# Patient Record
Sex: Female | Born: 1935
Health system: Southern US, Community
[De-identification: ages and names within clinical notes are randomized; demographics above are authoritative.]

## PROBLEM LIST (undated history)

## (undated) DIAGNOSIS — E785 Hyperlipidemia, unspecified: Secondary | ICD-10-CM

## (undated) DIAGNOSIS — E538 Deficiency of other specified B group vitamins: Secondary | ICD-10-CM

## (undated) DIAGNOSIS — E039 Hypothyroidism, unspecified: Secondary | ICD-10-CM

## (undated) DIAGNOSIS — E049 Nontoxic goiter, unspecified: Secondary | ICD-10-CM

## (undated) DIAGNOSIS — I10 Essential (primary) hypertension: Secondary | ICD-10-CM

## (undated) DIAGNOSIS — E7800 Pure hypercholesterolemia, unspecified: Secondary | ICD-10-CM

## (undated) DIAGNOSIS — M810 Age-related osteoporosis without current pathological fracture: Secondary | ICD-10-CM

## (undated) DIAGNOSIS — I2510 Atherosclerotic heart disease of native coronary artery without angina pectoris: Secondary | ICD-10-CM

## (undated) DIAGNOSIS — M1990 Unspecified osteoarthritis, unspecified site: Secondary | ICD-10-CM

## (undated) DIAGNOSIS — A0472 Enterocolitis due to Clostridium difficile, not specified as recurrent: Secondary | ICD-10-CM

## (undated) DIAGNOSIS — K225 Diverticulum of esophagus, acquired: Secondary | ICD-10-CM

## (undated) DIAGNOSIS — D649 Anemia, unspecified: Secondary | ICD-10-CM

## (undated) DIAGNOSIS — K219 Gastro-esophageal reflux disease without esophagitis: Secondary | ICD-10-CM

## (undated) HISTORY — PX: CORONARY ARTERY BYPASS GRAFT: SHX141

## (undated) HISTORY — DX: Nontoxic goiter, unspecified: E04.9

## (undated) HISTORY — DX: Essential (primary) hypertension: I10

## (undated) HISTORY — DX: Diverticulum of esophagus, acquired: K22.5

## (undated) HISTORY — DX: Atherosclerotic heart disease of native coronary artery without angina pectoris: I25.10

## (undated) HISTORY — DX: Age-related osteoporosis without current pathological fracture: M81.0

## (undated) HISTORY — DX: Deficiency of other specified B group vitamins: E53.8

## (undated) HISTORY — PX: ESOPHAGEAL DILATION: SHX303

## (undated) HISTORY — DX: Gastro-esophageal reflux disease without esophagitis: K21.9

## (undated) HISTORY — DX: Pure hypercholesterolemia, unspecified: E78.00

## (undated) HISTORY — PX: APPENDECTOMY: SHX54

## (undated) HISTORY — DX: Hyperlipidemia, unspecified: E78.5

## (undated) HISTORY — PX: BUNIONECTOMY: SHX129

## (undated) HISTORY — PX: ABDOMINAL HYSTERECTOMY: SHX81

## (undated) HISTORY — DX: Enterocolitis due to Clostridium difficile, not specified as recurrent: A04.72

---

## 1998-08-03 ENCOUNTER — Encounter: Payer: Self-pay | Admitting: *Deleted

## 1998-08-03 ENCOUNTER — Ambulatory Visit (HOSPITAL_COMMUNITY): Admission: RE | Admit: 1998-08-03 | Discharge: 1998-08-03 | Payer: Self-pay | Admitting: *Deleted

## 1998-10-13 ENCOUNTER — Encounter: Payer: Self-pay | Admitting: Surgery

## 1998-10-17 ENCOUNTER — Inpatient Hospital Stay (HOSPITAL_COMMUNITY): Admission: RE | Admit: 1998-10-17 | Discharge: 1998-10-22 | Payer: Self-pay | Admitting: Surgery

## 1998-10-17 ENCOUNTER — Encounter: Payer: Self-pay | Admitting: Surgery

## 1998-10-18 ENCOUNTER — Encounter: Payer: Self-pay | Admitting: Surgery

## 1998-10-19 ENCOUNTER — Encounter: Payer: Self-pay | Admitting: Surgery

## 2001-01-17 ENCOUNTER — Ambulatory Visit (HOSPITAL_COMMUNITY): Admission: RE | Admit: 2001-01-17 | Discharge: 2001-01-17 | Payer: Self-pay

## 2002-01-21 ENCOUNTER — Ambulatory Visit (HOSPITAL_COMMUNITY): Admission: RE | Admit: 2002-01-21 | Discharge: 2002-01-21 | Payer: Self-pay

## 2002-06-11 ENCOUNTER — Encounter: Payer: Self-pay | Admitting: Cardiology

## 2002-06-16 ENCOUNTER — Encounter: Payer: Self-pay | Admitting: Emergency Medicine

## 2002-06-16 ENCOUNTER — Emergency Department (HOSPITAL_COMMUNITY): Admission: EM | Admit: 2002-06-16 | Discharge: 2002-06-16 | Payer: Self-pay | Admitting: Emergency Medicine

## 2002-06-30 ENCOUNTER — Encounter (HOSPITAL_COMMUNITY): Admission: RE | Admit: 2002-06-30 | Discharge: 2002-07-30 | Payer: Self-pay | Admitting: Cardiology

## 2002-07-30 ENCOUNTER — Other Ambulatory Visit: Admission: RE | Admit: 2002-07-30 | Discharge: 2002-07-30 | Payer: Self-pay | Admitting: Unknown Physician Specialty

## 2003-04-15 ENCOUNTER — Ambulatory Visit (HOSPITAL_COMMUNITY): Admission: RE | Admit: 2003-04-15 | Discharge: 2003-04-15 | Payer: Self-pay | Admitting: Pulmonary Disease

## 2003-06-02 ENCOUNTER — Encounter: Payer: Self-pay | Admitting: Cardiology

## 2004-03-08 ENCOUNTER — Other Ambulatory Visit: Admission: RE | Admit: 2004-03-08 | Discharge: 2004-03-08 | Payer: Self-pay | Admitting: Dermatology

## 2004-07-26 ENCOUNTER — Ambulatory Visit: Payer: Self-pay | Admitting: *Deleted

## 2005-03-03 ENCOUNTER — Emergency Department (HOSPITAL_COMMUNITY): Admission: EM | Admit: 2005-03-03 | Discharge: 2005-03-03 | Payer: Self-pay | Admitting: Emergency Medicine

## 2005-09-28 ENCOUNTER — Ambulatory Visit: Payer: Self-pay | Admitting: Internal Medicine

## 2005-10-03 ENCOUNTER — Ambulatory Visit: Payer: Self-pay | Admitting: Internal Medicine

## 2005-10-03 ENCOUNTER — Ambulatory Visit (HOSPITAL_COMMUNITY): Admission: RE | Admit: 2005-10-03 | Discharge: 2005-10-03 | Payer: Self-pay | Admitting: Internal Medicine

## 2006-02-12 ENCOUNTER — Ambulatory Visit: Payer: Self-pay | Admitting: *Deleted

## 2006-06-05 ENCOUNTER — Ambulatory Visit: Payer: Self-pay | Admitting: Internal Medicine

## 2006-06-05 ENCOUNTER — Ambulatory Visit (HOSPITAL_COMMUNITY): Admission: RE | Admit: 2006-06-05 | Discharge: 2006-06-05 | Payer: Self-pay | Admitting: Internal Medicine

## 2006-08-06 ENCOUNTER — Ambulatory Visit: Payer: Self-pay | Admitting: Internal Medicine

## 2006-08-11 ENCOUNTER — Emergency Department (HOSPITAL_COMMUNITY): Admission: EM | Admit: 2006-08-11 | Discharge: 2006-08-11 | Payer: Self-pay | Admitting: Emergency Medicine

## 2007-08-07 ENCOUNTER — Ambulatory Visit: Payer: Self-pay | Admitting: Urgent Care

## 2007-08-22 ENCOUNTER — Ambulatory Visit: Payer: Self-pay | Admitting: Internal Medicine

## 2007-08-25 ENCOUNTER — Ambulatory Visit (HOSPITAL_COMMUNITY): Admission: RE | Admit: 2007-08-25 | Discharge: 2007-08-25 | Payer: Self-pay | Admitting: Internal Medicine

## 2007-09-02 ENCOUNTER — Ambulatory Visit: Payer: Self-pay | Admitting: Internal Medicine

## 2007-09-02 ENCOUNTER — Ambulatory Visit (HOSPITAL_COMMUNITY): Admission: RE | Admit: 2007-09-02 | Discharge: 2007-09-02 | Payer: Self-pay | Admitting: Internal Medicine

## 2007-09-02 HISTORY — PX: ESOPHAGOGASTRODUODENOSCOPY: SHX1529

## 2008-02-04 ENCOUNTER — Ambulatory Visit: Payer: Self-pay | Admitting: Internal Medicine

## 2008-11-18 ENCOUNTER — Encounter (INDEPENDENT_AMBULATORY_CARE_PROVIDER_SITE_OTHER): Payer: Self-pay | Admitting: *Deleted

## 2008-11-30 ENCOUNTER — Ambulatory Visit: Payer: Self-pay | Admitting: Internal Medicine

## 2008-11-30 DIAGNOSIS — K222 Esophageal obstruction: Secondary | ICD-10-CM | POA: Insufficient documentation

## 2008-12-08 ENCOUNTER — Ambulatory Visit (HOSPITAL_COMMUNITY): Admission: RE | Admit: 2008-12-08 | Discharge: 2008-12-08 | Payer: Self-pay | Admitting: Internal Medicine

## 2008-12-14 ENCOUNTER — Encounter: Payer: Self-pay | Admitting: Internal Medicine

## 2008-12-21 ENCOUNTER — Ambulatory Visit (HOSPITAL_COMMUNITY): Admission: RE | Admit: 2008-12-21 | Discharge: 2008-12-21 | Payer: Self-pay | Admitting: Internal Medicine

## 2008-12-21 ENCOUNTER — Ambulatory Visit: Payer: Self-pay | Admitting: Internal Medicine

## 2008-12-21 HISTORY — PX: ESOPHAGOGASTRODUODENOSCOPY: SHX1529

## 2009-11-15 ENCOUNTER — Ambulatory Visit (HOSPITAL_COMMUNITY): Admission: RE | Admit: 2009-11-15 | Discharge: 2009-11-15 | Payer: Self-pay | Admitting: Pulmonary Disease

## 2010-02-06 ENCOUNTER — Telehealth (INDEPENDENT_AMBULATORY_CARE_PROVIDER_SITE_OTHER): Payer: Self-pay

## 2010-02-07 ENCOUNTER — Encounter (INDEPENDENT_AMBULATORY_CARE_PROVIDER_SITE_OTHER): Payer: Self-pay | Admitting: *Deleted

## 2010-02-20 HISTORY — PX: ESOPHAGOGASTRODUODENOSCOPY: SHX1529

## 2010-02-27 ENCOUNTER — Ambulatory Visit: Payer: Self-pay | Admitting: Gastroenterology

## 2010-02-27 DIAGNOSIS — R1310 Dysphagia, unspecified: Secondary | ICD-10-CM | POA: Insufficient documentation

## 2010-02-27 DIAGNOSIS — K219 Gastro-esophageal reflux disease without esophagitis: Secondary | ICD-10-CM | POA: Insufficient documentation

## 2010-03-01 ENCOUNTER — Encounter: Payer: Self-pay | Admitting: Gastroenterology

## 2010-03-15 ENCOUNTER — Ambulatory Visit (HOSPITAL_COMMUNITY): Admission: RE | Admit: 2010-03-15 | Discharge: 2010-03-15 | Payer: Self-pay | Admitting: Internal Medicine

## 2010-03-15 ENCOUNTER — Ambulatory Visit: Payer: Self-pay | Admitting: Internal Medicine

## 2010-08-13 ENCOUNTER — Encounter: Payer: Self-pay | Admitting: Pulmonary Disease

## 2010-08-22 NOTE — Letter (Signed)
Summary: tcs order  tcs order   Imported By: Hendricks Limes LPN 95/62/1308 65:78:46  _____________________________________________________________________  External Attachment:    Type:   Image     Comment:   External Document

## 2010-08-22 NOTE — Letter (Signed)
Summary: Scheduled Appointment  [facility]  [address]   [place], [place] [address]   Phone: [phone]  Fax: [phone]    February 07, 2010   Dear: Kearney Hard Furukawa            DOB: Nov 23, 1918    I have been instructed to schedule you an appointment in our office.  Your appointment is as follows:   Date:           02/27/10   Time:          11AM    Please be here 15 minutes early.   Provider:      Tana Coast, P.A.-C.    Please contact the office if you need to reschedule this appointment for a more convenient time.   Thank you,    Diana Eves       [facility] Ph: [phone]   Fax: [phone]

## 2010-08-22 NOTE — Progress Notes (Signed)
Summary: difficulty swallowing  Phone Note Call from Patient Call back at Home Phone [phone] Call back at [phone]   Caller: Other Relative Summary of Call: Neice- Pheobe Ellington called- pt is having difficulty swallowing again. She can keep down liquids and soft solids. Advised if they start experiencing episodes where she is getting choked on her own salivia she needs to go to ED.  They want to know if pt needs ov or if they can be triaged to have egd with ed. please advise.  Initial call taken by: Hendricks Limes LPN,  February 06, 2010 11:53 AM     Appended Document: difficulty swallowing would be best to have ov first  Appended Document: difficulty swallowing mailed letter to pt with appt for 8/8 @ 11am w/LSL

## 2010-08-22 NOTE — Assessment & Plan Note (Signed)
Summary: DYSPHAGIA WITH SOLID AND LIQUIDS/SS   Visit Type:  f/u Primary Care Provider:  Shaune Pollack  Chief Complaint:  difficulty swallowing.  History of Present Illness: Ms. Lennartz is a pleasant 77 y/o female, with h/o recurrent esophageal dysphagia requiring numerous esophageal dilatations in the past. Last one in 6/10. She presents with c/o difficulty swallowing. Her niece, Mosetta Putt, called couple of weeks ago concerned about pt's swallowing problems.   Having trouble getting solid foods down. Eating small amounts, soft foods, drinking plenty of liquids. No odynophagia. No abd pain. BMs less frequent since cut back on eating. No melena, brbpr. Weight down 3 pounds.  Patient does not know her medications and did not bring list. She gets most mail-order. States she takes ASA and Fosamax daily.   Current Medications (verified): 1)  Bayer Aspirin 325 Mg Tabs (Aspirin) .... Take 1 Tablet By Mouth Once A Day 2)  Accupril 20 Mg Tabs (Quinapril Hcl) .... Take 1 Tablet By Mouth Once A Day 3)  Toprol Xl 50 Mg .... Take 1 Tablet By Mouth Once A Day 4)  Lipitor 10 Mg Tabs (Atorvastatin Calcium) .... Take 1 Tablet By Mouth Once A Day 5)  Synthroid 175 Mcg Tabs (Levothyroxine Sodium) .... Take 1 Tablet By Mouth Once A Day 6)  Norvasc 5 Mg Tabs (Amlodipine Besylate) .... Take 1 Tablet By Mouth Once A Day 7)  Prevacid Solutab 30 Mg Tbdp (Lansoprazole) .... Take 1 Tablet By Mouth Once A Day 8)  B 12 Injection .... Once Monthly 9)  Fosamax 70 Mg Tabs (Alendronate Sodium) .... Qweekly  Allergies (verified): 1)  ! Lucy Antigua  Past History:  Past Medical History: Thyroid goiter s/p surgery now on Synthroid B12 def Coronary Artery Disease Small Zenker diverticulum Multiple esophgeal dilations, Schatzkri ring/Cervical web, 2009  EGD/ED, 6/10, Dr. Rourk-->1. Schatzki's ring, otherwise endoscopically normal esophagus, status post dilation (69F), with mucosal disruption through the cervical segment as  well as the Schatzki's ring. Hiatal hernia. Hyperlipidemia Hypertension Osteoporosis GERD  Past Surgical History: Status Post four-vessel coronary artery bypass graft  Thyroid goiter/nodule Bunionectomy Hysterectomy Appendectomy    Family History: No FH of Colon Cancer, liver disease, chronic GI illnesses.  Social History: Reviewed history from 11/30/2008 and no changes required. Marital Status: Widowed Children: No Occupation: [employer] 35 yrs Patient has never smoked.  Alcohol Use - no  Review of Systems General:  Complains of weight loss; denies fever, chills, sweats, anorexia, fatigue, and weakness. Eyes:  Denies vision loss. ENT:  Complains of difficulty swallowing; denies nasal congestion, sore throat, and hoarseness. CV:  Denies chest pains, angina, palpitations, dyspnea on exertion, and peripheral edema. Resp:  Denies dyspnea at rest, dyspnea with exercise, cough, sputum, and wheezing. GI:  See HPI. GU:  Denies urinary burning and blood in urine. MS:  Denies joint pain / LOM. Derm:  Denies rash and itching. Neuro:  Denies weakness, frequent headaches, memory loss, and confusion. Psych:  Denies depression and anxiety. Endo:  Denies unusual weight change. Heme:  Denies bruising and bleeding. Allergy:  Denies hives and rash.  Vital Signs:  Patient profile:   77 year old female Height:      63 inches Weight:      134 pounds BMI:     23.82 Temp:     97.7 degrees F oral Pulse rate:   60 / minute BP sitting:   110 / 72  (left arm) Cuff size:   regular  Vitals Entered By: Hendricks Limes LPN (  February 27, 2010 11:02 AM)  Physical Exam  General:  Well developed, well nourished, no acute distress. Head:  Normocephalic and atraumatic. Eyes:  sclera nonicteric Mouth:  Oropharyngeal mucosa moist, pink.  No lesions, erythema or exudate.    Neck:  Supple; no masses or thyromegaly. Lungs:  Clear throughout to auscultation. Heart:  Regular rate and rhythm; no murmurs, rubs,   or bruits. Abdomen:  Bowel sounds normal.  Abdomen is soft, nontender, nondistended.  No rebound or guarding.  No hepatosplenomegaly, masses or hernias.  No abdominal bruits.  Extremities:  No clubbing, cyanosis, edema or deformities noted. Neurologic:  Alert and  oriented x4;  grossly normal neurologically. Skin:  Intact without significant lesions or rashes. Cervical Nodes:  No significant cervical adenopathy. Psych:  poor memory.    Impression & Recommendations:  Problem # 1:  DYSPHAGIA UNSPECIFIED (ICD-787.20)  Recurrent dysphagia with h/o cervical esophageal web/schatzki's ring in past. Last EGD over one year ago. Symptoms recurrent for past couple of months. Patient back on Fosamax, per pharmacy she takes once weekly (last filled 01/30/10), but patients says she takes daily. Will have niece provide [place] with accurant drug list. Patient will need to be scheduled for EGD/ED after list obtained. Need to make sure she is still on PPI as well.   Orders: Est. Patient Level IV (04540)  Problem # 2:  GERD (ICD-530.81)  see #1.  Orders: Est. Patient Level IV (98119)  Appended Document: DYSPHAGIA WITH SOLID AND LIQUIDS/SS Please schedule for EGD/ED with RMR.  Patient was instructed to hold Fosamax until after procedure (due to dysphagia). May continue ASA.  Appended Document: DYSPHAGIA WITH SOLID AND LIQUIDS/SS pt scheduled for 03/15/10 @ 1045 with RMR. Instructions mailed to pt

## 2010-12-05 NOTE — Assessment & Plan Note (Signed)
NAME:  ADALEY, KIENE                 CHART#:  [id number]   DATE:  02/04/2008                       DOB:  1918/08/05   CHIEF COMPLAINT:  Followup dysphagia.   PROBLEM LIST:  1. Cervical esophageal web and Schatzki ring, status post last      dilatation by Dr. Jena Gauss on 09/02/2007, EGD.  2. Small Zenker diverticulum.  3. Coronary disease.  4. Hypercholesterolemia.  5. Thyroid goiter status post surgery, now on Synthroid.  6. Hysterectomy.  7. Appendectomy.  8. Osteoporosis.  9. Chronic sinusitis.   SUBJECTIVE:  The patient is an 77 year old Caucasian female.  She has  history of esophageal dysphagia, last dilatation as described above.  She is doing very well.  She is taking Prevacid 30 mg daily.  She has no  complaints today.  Her weight is stable.  Her appetite is good.  She  denies any problems with liquids.  Occasionally, she still gets choked  on large bites.  She tries to eat smaller bites of food.  Denies any  heartburn or indigestion.  Denies any odynophagia and her bowel  movements have been normal, soft, and brown on a daily basis.  Denies  any rectal bleeding or melena.   CURRENT MEDICATIONS:  Detailed list from 02/04/2008.   ALLERGIES:  No known drug allergies.   OBJECTIVE:  VITAL SIGNS:  Weight 140 pounds, height 64 inches,  temperature 97.2, blood pressure 130/70, and pulse 64.  GENERAL:  The patient is a pleasant, elderly, cooperative Caucasian  female in no acute distress.  HEENT:  Sclerae nonicteric.  Conjunctivae pink.  Oropharynx pink and  moist without any lesions.  CHEST:  Heart has regular rate and rhythm.  Normal S1, S2.  ABDOMEN:  Positive bowel sounds x4.  No bruits auscultated.  Soft,  nontender, and nondistended without palpable mass or hepatosplenomegaly.  No rebound, tenderness, or guarding.  EXTREMITIES:  Without clubbing or edema.   ASSESSMENT:  Esophageal dysphagia with history of small Zenker  diverticulum, cervical web, and Schatzki ring,  doing well status post  esophageal dilatation on 09/02/2007.   PLAN:  Continue Prevacid Solu Tab 30 mg daily.  Office visit in 1 year  with Dr. Jena Gauss or sooner, if she has any problems.       Lorenza Burton, N.P.  Electronically Signed     R. Roetta Sessions, M.D.  Electronically Signed    KJ/MEDQ  D:  02/04/2008  T:  02/05/2008  Job:  [id number]   cc:   Ramon Dredge L. Juanetta Gosling, M.D.

## 2010-12-05 NOTE — Assessment & Plan Note (Signed)
NAME:  ACACIA, LATORRE                 CHART#:  [id number]   DATE:  08/22/2007                       DOB:  18-Jan-1919   CHIEF COMPLAINT:  Dysphagia, I want EGD and my esophagus stretched.   SUBJECTIVE:  Ms. Cogliano is an 77 year old female.  She has history of  complicated gastroesophageal reflux disease and peptic stricture.  She  was last dilated June 05, 2006.  Interestingly, I had just seen Ms.  Trimarco about two weeks age. Please see my August 07, 2007 note.  She had  told me she was doing quite well at that time.  She denied any recurrent  dysphagia.  Overall she was doing quite well.  She also noted that she  was continuing to take her Fosamax, which Dr. Jena Gauss had discontinued  previously.  It appears as though she felt like she needed it and  continued to take it.  Her niece, who is with her today, thinks that  this may have been discontinued, but ultimately neither one is sure as  to whether she is taking the Fosamax at this time or not.  Her niece  tells me she has had several choking spells, most recently was 4 days  ago at [employer].  She felt as though her food got stuck in her mid  esophagus.  It would not go down.  She tells me she has had two or three  episodes since around the holidays within the last two months.  She  tells me she chews her food very thoroughly and eats small amounts.  She  does well.  She never has any problems with liquids.  Her weight is  stable.   CURRENT MEDICATIONS:  See the list from August 22, 2007.   ALLERGIES:  No known drug allergies.   OBJECTIVE:  VITAL SIGNS:  Weight:  140 pounds.  Height:  54 inches.  Temperature:  97.5.  Blood pressure:  118/72.  Pulse:  72.  GENERAL:  Ms. Bahena is an 77 year old elderly female who is alert,  oriented, pleasant and easily confused.  She is oriented to self and to  place, but disoriented to time.  HEENT:  Sclerae clear, nonicteric.  Conjunctivae pink.  Oropharynx pink  and moist without lesions.  SKIN:   Warm and dry without any rash or jaundice.   ASSESSMENT:  Ms. Lilja is a pleasant 77 year old female with history of  complicated GERD status post dilatation of a peptic stricture in  November of 2007.  Last seen two weeks ago.  There were no complaints of  dysphagia today.  She is brought in for a work-in with her niece, who  notes she has had several choking episodes over the last two or three  months, mostly with solid foods.  Denies any problems with liquids.  I  suspect she may have recurrence of peptic stricture versus an  oropharyngeal dysphagia.  Interestingly, we do not know whether Ms. Haan  is taking her Fosamax or not, but this could be contributing to her  dysphagia as well.   PLAN:  1. Barium pill esophagram in the near future to further differentiate      whether or not she is going to need an esophageal dilatation.  2. She is to continue Prevacid 30 mg daily.  3. I will be calling her  with the results and go from there.       Lorenza Burton, N.P.  Electronically Signed     R. Roetta Sessions, M.D.  Electronically Signed    KJ/MEDQ  D:  08/22/2007  T:  08/22/2007  Job:  [id number]   cc:   Ramon Dredge L. Juanetta Gosling, M.D.

## 2010-12-05 NOTE — Assessment & Plan Note (Signed)
NAME:  Elizabeth [name], Elizabeth [name]                 CHART#:  [id number]   DATE:  08/07/2007                       DOB:  [birth year]   CHIEF COMPLAINT:  Annual followup, complicated GERD.   SUBJECTIVE:  Ms. Cleaver is an 77 year old female.  She has a history of  complicated gastroesophageal reflux disease.  On June 05, 2006, she  was dilated with a 56-French Maloney dilator during EGD due to a peptic  stricture of the EG junction.  She has done quite well since that time.  Her weight has remained stable.  She rarely has choking episodes, tells  me only when she eats to much.  She is taking Fosamax. She tells me  she takes it before supper.  She is also on Prevacid 30 mg solutabs  daily.  She denies any anorexia or early satiety.  She denies any rectal  bleeding or melena.   CURRENT MEDICATIONS:  See the list from August 07, 2007.   ALLERGIES:  No known drug allergies.   OBJECTIVE:  VITAL SIGNS:  Weight 142 pounds.  Height 64 inches.  Temp 98  degrees.  Blood pressure 112/82.  Pulse 72.  GENERAL:  She is an elderly Caucasian female who is alert, oriented,  pleasant, and cooperative, in no acute distress.  HEENT:  Sclerae clear, anicteric.  Conjunctivae pink.  Oropharynx pink  and moist without any lesions.  CHEST:  Heart regular rate and rhythm.  Normal S1, S2.  ABDOMEN:  Positive bowel sounds x4.  No bruits auscultated.  Soft,  nontender, nondistended without hepatosplenomegaly.  EXTREMITIES:  There is 1+ pretibial edema bilaterally and ankle edema.   ASSESSMENT:  Mr. Meyerhoff is an 77 year old female with complicated  gastroesophageal reflux disease with history of peptic stricture.  Last  dilatation June 05, 2006, doing well.   PLAN:  1. Continue Prevacid 30 mg daily, #30 with 11 refills.  2. Office visit in one year with Dr. Jena Gauss or sooner if needed.       Lorenza Burton, N.P.  Electronically Signed     R. Roetta Sessions, M.D.  Electronically Signed    KJ/MEDQ  D:  08/08/2007   T:  08/08/2007  Job:  [id number]   cc:   Ramon Dredge L. Juanetta Gosling, M.D.

## 2010-12-05 NOTE — Op Note (Signed)
NAME:  Elizabeth [name], REBER                ACCOUNT NO.:  [id number]   MEDICAL RECORD NO.:  [id number]          PATIENT TYPE:  AMB   LOCATION:  DAY                           FACILITY:  [facility]   PHYSICIAN:  R. Roetta Sessions, M.D. DATE OF BIRTH:  04/20/1919   DATE OF PROCEDURE:  12/21/2008  DATE OF DISCHARGE:                               OPERATIVE REPORT   EGD with Elease Hashimoto dilation.   INDICATIONS FOR PROCEDURE:  This 77 year old lady with recurrent  esophageal dysphagia has a history of cervical esophageal web Schatzki's  ring dilated in early 2009.  Recent barium pill esophagram demonstrated  narrowing consistent with a stricture/cervical web, at which point the  pill hung and dissolved.  She is here for EGD with dilation as  appropriate.  Risks, benefits, alternatives and limitations have been  reviewed previously and again at the bedside.  All parties questions  answered, all parties agreeable.   PROCEDURE NOTE:  The O2 saturation, blood pressure, pulse respirations  monitored throughout the entirety of the procedure.   CONSCIOUS SEDATION:  1. Versed 3 mg IV.  2. Demerol 75 mg IV in divided doses.   INSTRUMENT:  Pentax video chip system.   Cetacaine spray for topical oropharyngeal anesthesia.   FINDINGS:  Examination of the tubular esophagus through the cervical  segment on down revealed no abnormalities.  She reportedly has a small  Zenker's, I did not appreciate it nor did I appreciate a cervical web or  narrowing.  There was a Schatzki's ring however, but the intervening  esophageal mucosa appeared normal.  EG junction easily traversed with  the scope.  The remaining stomach, colon, gastric cavity was emptied and  insufflated well with air.  A thorough examination of the gastric  mucosa, including retroflexion of the proximal stomach and  esophagogastric junction, demonstrated only a hiatal hernia.  Pylorus  was patent and easily traversed.  Examination of the bulb and second  portion revealed no abnormalities.   THERAPEUTIC/DIAGNOSTIC MANEUVERS PERFORMED:  The scope was withdrawn and  a 56-French Maloney dilator was passed to full insertion with slight  resistance upon full insertion.  A look back revealed a superficial tear  through the UES mucosa and disruption of the Schatzki's ring.  There was  minimal bleeding and no apparent complication.  The patient tolerated  the procedure well, was reactive to endoscopy.   IMPRESSION:  1. Schatzki's ring, otherwise endoscopically normal esophagus, status      post dilation described above, with mucosal disruption through the      cervical segment as well as the Schatzki's ring.  2. Hiatal hernia, otherwise normal stomach, D1, D2.   RECOMMENDATIONS:  1. Continue Prevacid 30 mg orally daily indefinitely.  2. If dysphagia symptoms should return she may well end up needing yet      another dilation, but hopefully this will hold her for some time to      come.      Jonathon Bellows, M.D.  Electronically Signed     RMR/MEDQ  D:  12/21/2008  T:  12/21/2008  Job:  [id number]  cc:   Ramon Dredge L. Juanetta Gosling, M.D.  Fax: [phone]

## 2010-12-05 NOTE — Op Note (Signed)
NAME:  Elizabeth [name], Elizabeth [name]                ACCOUNT NO.:  [id number]   MEDICAL RECORD NO.:  [id number]          PATIENT TYPE:  AMB   LOCATION:  DAY                           FACILITY:  [facility]   PHYSICIAN:  R. Roetta Sessions, M.D. DATE OF BIRTH:  [birth year]   DATE OF PROCEDURE:  09/02/2007  DATE OF DISCHARGE:  09/02/2007                               OPERATIVE REPORT   PROCEDURE PERFORMED:  Esophagogastroduodenoscopy with Elease Hashimoto dilation.   INDICATIONS FOR PROCEDURE:  77 year old lady with recurrent esophageal  dysphagia and Schatzki's ring.  She was last dilated back in 2007.  She  has been taking Prevacid 30 mg orally daily. EGD is now being done.  The  potential for esophageal dilation are reviewed. The risks, benefits and  alternatives have been discussed.  Please see documentation in the  medical record.   PROCEDURE NOTE:  O2 saturation, blood pressure, pulse, and respirations  were monitored throughout the entire procedure.  Conscious sedation with  Versed 4 mg IV and Demerol 75 mg IV in divided doses.  Instrument Pentax  video chip system.  Cetacaine spray for topical pharyngeal anesthesia.   FINDINGS:  Examination of the tubular esophagus revealed a cervical  esophageal web and a Schatzki's ring.  The intervening esophageal mucosa  appeared normal.  The EG junction was easily traversed.  The gastric  cavity was emptied and insufflated well with air.  A thorough  examination of the gastric mucosa including retroflex view of the  proximal stomach and esophagogastric junction demonstrated only a hiatal  hernia.  The pylorus was patent, easily traversed.  Examination of the  bulb and second portion revealed no abnormalities.   THERAPEUTIC/DIAGNOSTIC MANEUVERS PERFORMED:  The scope was withdrawn.  A  56 French Maloney dilator was passed to full insertion with ease.  Look  back revealed both the ring and the web had been ruptured without  apparent complication.  The patient tolerated the  procedure well and was  reacted in endoscopy.   IMPRESSION:  Cervical esophageal web, Schatzki's ring, status post  dilation and disruption as described above. Otherwise, normal esophagus.  Hiatal hernia, otherwise, normal stomach, D1 and D2.   RECOMMENDATIONS:  Advance diet as tolerated later today.  Continue  Prevacid 30 mg orally daily indefinitely. The patient is to let me know  if she has any future difficulties with swallowing.      Jonathon Bellows, M.D.  Electronically Signed     RMR/MEDQ  D:  09/02/2007  T:  09/04/2007  Job:  [id number]   cc:   Ramon Dredge L. Juanetta Gosling, M.D.  Fax: [phone]

## 2010-12-08 NOTE — Assessment & Plan Note (Signed)
[facility]                         [facility] OFFICE NOTE   NAME:Elizabeth [name], Elizabeth [name]                       MRN:          [id number]  DATE:02/12/2006                            DOB:          1918-11-18    PRIMARY CARE PHYSICIAN:  Ramon Dredge L. Juanetta Gosling, MD   Ms. Cove returns today.  This is a woman who was previously followed by my  colleague, Dr. Valera Castle, who has coronary disease, normal LV systolic  function, hypertension, hyperlipidemia, hypothyroidism.  She is doing very  well and has no complaints.  She is still very active.  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  Also does a little bit on the  stationary  bike.  She is otherwise feeling great.   CURRENT MEDICATIONS:  1.  Synthroid 0.75 mcg once daily.  2.  Aspirin 325 once daily.  3.  Accupril 20 mg once daily.  4.  Norvasc 10 mg once daily.  5.  Toprol XL 50 mg once daily.  6.  Lipitor 10 mg once daily.  7.  Antivert 25 mg once daily.  8.  Fosamax.  She takes once a week.   PHYSICAL EXAMINATION:  VITAL SIGNS:  Weight 138 pounds.  Blood pressure  98/60, pulse 82.  CHEST:  Clear.  VASCULAR:  She has no jugular venous distention or lower extremity edema.  CARDIOVASCULAR:  Regular with no murmur.   IMPRESSION:  1.  Coronary disease appears to be stable.  2.  Hypertension.  She is a little bit over-medicated.  I think probably      dropping her Norvasc down to 5 mg once daily probably makes more sense.      Her lipids need to be checked, and we will get those checked for her.      She is on a reasonable medication regimen otherwise.  Hypertension is a      little bit too aggressively controlled, so we will have to decrease her      Norvasc.  We will see her back in 12 months for a routine followup.  She      is going to follow up with Dr. Juanetta Gosling for a blood pressure check.                                  Farris Has. Dorethea Clan, MD   JMH/MedQ  DD:  02/12/2006  DT:   02/12/2006  Job #:  [id number]   cc:   Ramon Dredge L. Juanetta Gosling, MD

## 2010-12-08 NOTE — Consult Note (Signed)
NAME:  Cheese, Dinah                    ACCOUNT NO.:  [id number]   MEDICAL RECORD NO.:  [id number]           PATIENT TYPE:   LOCATION:                                 FACILITY:   PHYSICIAN:  R. Roetta Sessions, M.D. DATE OF BIRTH:  August 07, 1918   DATE OF CONSULTATION:  09/28/2005  DATE OF DISCHARGE:                                   CONSULTATION   REFERRING PHYSICIAN:  Oneal Deputy. Juanetta Gosling, M.D.   REASON FOR CONSULTATION:  Problem swallowing.   HISTORY OF PRESENT ILLNESS:  Ms. Vukelich is an 77 year old, Caucasian female  with 25-month history of esophageal dysphagia.  She is having problems  swallowing large meals and solid foods.  She has to wash her food down with  lots of liquid.  Denies any odynophagia or heartburn, nausea, vomiting,  abdominal pain, constipation, diarrhea, melena or rectal bleeding.  She has  never had an upper endoscopy or colonoscopy.   CURRENT MEDICATIONS:  1.  Fosamax 70 mg each week.  2.  Aspirin 325 mg daily.  3.  Accupril 20 mg daily.  4.  Toprol XL 50 mg daily.  5.  Lipitor 10 mg daily.  6.  Synthroid 75 mcg daily.  7.  Norvasc 10 mg daily.   ALLERGIES:  No known drug allergies.   PAST MEDICAL HISTORY:  1.  Sinusitis.  2.  Osteoporosis.  3.  Coronary artery disease, status post four-vessel coronary artery bypass      graft over 5 years ago.  4.  Hypercholesterolemia.  5.  History of thyroid goiter or nodule, status post surgery, now on      Synthroid.  6.  Bunionectomy.  7.  Hysterectomy.  8.  Appendectomy.   FAMILY HISTORY:  Negative for colorectal cancer or chronic GI illnesses.   SOCIAL HISTORY:  She is widowed.  She is retired from [employer].  Nonsmoker.  No alcohol use.   REVIEW OF SYSTEMS:  GASTROINTESTINAL:  See HPI for GI.  Denies any weight  loss.  CARDIOPULMONARY:  No chest pain or shortness of breath.   PHYSICAL EXAMINATION:  VITAL SIGNS:  Weight 141, height 5 feet 4 inches,  temperature 97.6, blood pressure 142/63, pulse  72.  GENERAL:  Well-developed, well-nourished, elderly, Caucasian female in no  acute distress.  SKIN:  Warm and dry, no jaundice.  HEENT:  Conjunctivae are pink.  Sclerae nonicteric.  Oropharyngeal mucosa  moist and pink.  No lesions, erythema or exudate.  No lymphadenopathy or  thyromegaly.  LUNGS:  Clear to auscultation.  CARDIAC:  Regular rate and rhythm with normal S1, S2.  No murmurs, rubs or  gallops.  ABDOMEN:  Positive bowel sounds.  Soft, nontender, nondistended.  No  organomegaly or masses.  No rebound tenderness or guarding.  No abdominal  bruits or hernias.  EXTREMITIES:  No edema.   IMPRESSION:  Ms. Auerbach is an 77 year old, Caucasian with a 3-month history of  solid food esophageal dysphagia.  I suspect she has an esophageal web-ring  or stricture.  She has never had a colonoscopy, would  consider one at some  point in the near future if she is interested.   RECOMMENDATIONS:  1.  EGD with esophageal dilatation with Dr. Jena Gauss in the near future.  2.  Will hold aspirin 4 days prior to procedure.  3.  If the patient is interested, could consider colonoscopy for screening      purposes at some point in the near future.   I would like to thank Dr. Juanetta Gosling for allowing [place] to take part in the care  of this patient.      Tana Coast, P.AJonathon Bellows, M.D.  Electronically Signed    LL/MEDQ  D:  09/28/2005  T:  09/28/2005  Job:  [id number]

## 2010-12-08 NOTE — Op Note (Signed)
NAME:  Elizabeth [name], Elizabeth [name]                ACCOUNT NO.:  [id number]   MEDICAL RECORD NO.:  [id number]          PATIENT TYPE:  AMB   LOCATION:  DAY                           FACILITY:  [facility]   PHYSICIAN:  R. Roetta Sessions, M.D. DATE OF BIRTH:  [birth year]   DATE OF PROCEDURE:  10/03/2005  DATE OF DISCHARGE:                                 OPERATIVE REPORT   PROCEDURE:  Esophagogastroduodenoscopy with Elease Hashimoto dilation.   INDICATIONS FOR PROCEDURE:  An 77 year old lady with esophageal dysphagia.  EGD is now being done. This approach has been discussed with the patient at  length. Potential risks, benefits, and alternatives have been reviewed and  questions answered. Please see H&P from September 28, 2005 for more information.   PROCEDURE NOTE:  O2 saturation, blood pressure, pulse, and respirations were  monitored throughout the entire procedure. Conscious sedation with Versed 2  mg IV and Demerol 50 mg IV in divided doses.   INSTRUMENT:  Olympus video chip system.   FINDINGS:  Examination of the tubular esophagus revealed a prominent  Schatzki's ring. Esophageal mucosa otherwise appeared normal. There was  somewhat of a dilation of the ring when the scope was passed through it.   Stomach:  Gastric cavity was empty and insufflated well with air. Thorough  examination of gastric mucosa including retroflexed view of the proximal  stomach and esophagogastric junction demonstrated moderately large-sized  hiatal hernia. A couple of antral erosions. Otherwise normal abnormalities.  Pylorus patent and easily traversed. Examination of bulb and second portion  revealed no abnormalities.   THERAPEUTIC/DIAGNOSTIC MANEUVERS:  A 54-French dilator was passed to full  insertion with ease. A look back revealed the ring had been successfully  ruptured without any apparent complication. The patient tolerated the  procedure well and was reactive to endoscopy.   IMPRESSION:  1.  Probable Schatzki's ring. Otherwise  normal esophagus. Status post      dilation and disruption as described above.  2.  Moderately large hiatal hernia. A couple of antral erosions. Otherwise      normal stomach. Normal D1 and D2.   RECOMMENDATIONS:  1.  Begin Aciphex 20 mg orally daily indefinitely.  2.  Swallowing precautions with Fosamax medication emphasized.  3.  The patient is to call me if she has any future problems.  4.  She has been offered a screening colonoscopy. This has been recommended      to Ms. Love. She will let us know.      Jonathon Bellows, M.D.  Electronically Signed     RMR/MEDQ  D:  10/03/2005  T:  10/04/2005  Job:  [id number]   cc:   Ramon Dredge L. Juanetta Gosling, M.D.  Fax: [phone]

## 2010-12-08 NOTE — Op Note (Signed)
NAME:  Elizabeth [name], HAUBNER                ACCOUNT NO.:  [id number]   MEDICAL RECORD NO.:  [id number]          PATIENT TYPE:  AMB   LOCATION:  DAY                           FACILITY:  [facility]   PHYSICIAN:  R. Roetta Sessions, M.D. DATE OF BIRTH:  07-Oct-1918   DATE OF PROCEDURE:  06/05/2006  DATE OF DISCHARGE:                                 OPERATIVE REPORT   PROCEDURE:  EGD with Elease Hashimoto dilation.   INDICATIONS FOR PROCEDURE:  The patient is a 77 year old lady with recurrent  esophageal dysphagia.  I saw her back in March of this year.  She was felt  to have incomplete Schatzki's ring and a hiatal hernia.  She was dilated up  a 54-French Maloney dilator.  Dysphagia improved for several weeks but then  it has easily recurred.  She is not on any acid suppression therapy.  I do  know she is taking Fosamax 70 mg weekly and aspirin 325 mg daily.  EGD is  now being done.  This approach has been discussed with the patient at  length.  Potential risks, benefits and alternatives have been reviewed,  questions answered and she is agreeable.  Please see documentation in the  medical record.   PROCEDURE NOTE:  O2 saturation, blood pressure, pulse and respirations  monitored throughout the entire procedure.   CONSCIOUS SEDATION:  Versed 3 mg IV Demerol 75 mg IV.   INSTRUMENT:  Olympus video chip system.   FINDINGS:  Examination of tubular esophagus revealed what appeared to be a  soft friable noncritical peptic stricture at the EG junction, did not see a  Zenker's.  There was no malignant appearance to any of the mucosa of the  upper GI tract.  EG junction was easily traversed with the scope.   Stomach:  Gastric cavity was emptied and insufflated well with air.  Thorough examination of the gastric mucosa including retroflexed view of the  proximal stomach and esophagogastric junction demonstrated a only a moderate  size hiatal hernia.  Pylorus is patent and easily traversed.  Examination of  the bulb  and second portion revealed no abnormalities.   THERAPEUTIC/DIAGNOSTIC MANEUVERS PERFORMED:  A 56-French Maloney dilator was  passed to full insertion.  A look back revealed the EG junction had been  dilated.  There was minimal bleeding, no apparent complication.  Related to  pass of the dilator, the patient tolerated the procedure well and was  reactive to endoscopy.   IMPRESSION:  Friable soft appearing noncritical peptic stricture at the  esophagogastric junction, otherwise normal esophagus status post dilation as  described above.  Moderate size hiatal hernia otherwise normal stomach, D1,  D2.   I suspect we may be seeing some effects of Fosamax in a background setting  of reflux disease.   RECOMMENDATIONS:  1. Stop Fosamax.  2. Begin Prevacid 30 mg SoluTab once daily, prescription given.  3. Follow-up appointment with me in 2 months.      Jonathon Bellows, M.D.  Electronically Signed     RMR/MEDQ  D:  06/05/2006  T:  06/05/2006  Job:  [id number]  cc:   Ramon Dredge L. Juanetta Gosling, M.D.  Fax: [phone]

## 2011-01-11 ENCOUNTER — Emergency Department (HOSPITAL_COMMUNITY): Payer: MEDICARE

## 2011-01-11 ENCOUNTER — Emergency Department (HOSPITAL_COMMUNITY)
Admission: EM | Admit: 2011-01-11 | Discharge: 2011-01-11 | Disposition: A | Discharge status: Home / Self Care | Payer: MEDICARE | Attending: Emergency Medicine | Admitting: Emergency Medicine

## 2011-01-11 DIAGNOSIS — I517 Cardiomegaly: Secondary | ICD-10-CM | POA: Insufficient documentation

## 2011-01-11 DIAGNOSIS — E039 Hypothyroidism, unspecified: Secondary | ICD-10-CM | POA: Insufficient documentation

## 2011-01-11 DIAGNOSIS — E785 Hyperlipidemia, unspecified: Secondary | ICD-10-CM | POA: Insufficient documentation

## 2011-01-11 DIAGNOSIS — K219 Gastro-esophageal reflux disease without esophagitis: Secondary | ICD-10-CM | POA: Insufficient documentation

## 2011-01-11 DIAGNOSIS — M542 Cervicalgia: Secondary | ICD-10-CM | POA: Insufficient documentation

## 2011-01-11 DIAGNOSIS — I10 Essential (primary) hypertension: Secondary | ICD-10-CM | POA: Insufficient documentation

## 2011-01-11 DIAGNOSIS — M62838 Other muscle spasm: Secondary | ICD-10-CM | POA: Insufficient documentation

## 2011-01-11 LAB — DIFFERENTIAL
Eosinophils Absolute: 0 10*3/uL (ref 0.0–0.7)
Eosinophils Relative: 0 % (ref 0–5)
Lymphocytes Relative: 26 % (ref 12–46)
Lymphs Abs: 2.2 10*3/uL (ref 0.7–4.0)
Monocytes Absolute: 1.3 10*3/uL — ABNORMAL HIGH (ref 0.1–1.0)
Monocytes Relative: 15 % — ABNORMAL HIGH (ref 3–12)

## 2011-01-11 LAB — CBC
HCT: 33.2 % — ABNORMAL LOW (ref 36.0–46.0)
MCH: 28.4 pg (ref 26.0–34.0)
MCV: 83.4 fL (ref 78.0–100.0)
RDW: 15 % (ref 11.5–15.5)
WBC: 8.3 10*3/uL (ref 4.0–10.5)

## 2011-01-11 LAB — BASIC METABOLIC PANEL
BUN: 22 mg/dL (ref 6–23)
CO2: 24 mEq/L (ref 19–32)
Chloride: 93 mEq/L — ABNORMAL LOW (ref 96–112)
GFR calc Af Amer: 52 mL/min — ABNORMAL LOW (ref >60)
Potassium: 4 mEq/L (ref 3.5–5.1)

## 2011-01-11 LAB — CK TOTAL AND CKMB (NOT AT ARMC): Relative Index: INVALID (ref 0.0–2.5)

## 2011-01-11 LAB — GLUCOSE, CAPILLARY: Glucose-Capillary: 119 mg/dL — ABNORMAL HIGH (ref 70–99)

## 2012-02-08 ENCOUNTER — Encounter: Payer: Self-pay | Admitting: Internal Medicine

## 2012-02-11 ENCOUNTER — Ambulatory Visit: Payer: MEDICARE | Admitting: Urgent Care

## 2012-02-14 ENCOUNTER — Ambulatory Visit (INDEPENDENT_AMBULATORY_CARE_PROVIDER_SITE_OTHER): Payer: MEDICARE | Admitting: Gastroenterology

## 2012-02-14 ENCOUNTER — Encounter: Payer: Self-pay | Admitting: Gastroenterology

## 2012-02-14 VITALS — BP 102/55 | HR 89 | Temp 97.4°F | Ht 63.0 in | Wt 123.8 lb

## 2012-02-14 DIAGNOSIS — R1310 Dysphagia, unspecified: Secondary | ICD-10-CM

## 2012-02-14 NOTE — Assessment & Plan Note (Signed)
78 year old female with recurrent dysphagia X 2 months, in the setting of known Zenker's diverticulum and Schatzki's ring requiring several dilations in the past. Last performed Aug 2011. She has also lost approximately 11 lbs in the past 2 years, notes lack of appetite.  At this point, we will proceed with a BPE, which was recommended at last EGD if recurrent symptoms. This will assess size of Zenker's diverticulum. She may ultimately need a repeat EGD due to Schatzki's ring. Pt is aware and states understanding.  UGI/BPE Possible EGD in near future Follow-up after UGI

## 2012-02-14 NOTE — Patient Instructions (Addendum)
We will be calling you to set up an xray of your esophagus.  In the meantime, eat very slowly, chew very well. Take small bites. Stick with a soft diet.

## 2012-02-14 NOTE — Progress Notes (Signed)
Primary Care Physician:  Fredirick Maudlin, MD Primary Gastroenterologist: Dr. Jena Gauss   Chief Complaint  Patient presents with  . Dysphagia    HPI:   Elizabeth [name] is a pleasant 78 year old female with hx of Zenker's diverticulum and esophageal dysphagia secondary to Schatzki's ring, s/p several dilations in the past. Last performed August 2011. She presents today again with recurrent dysphagia for the past 2 months, worsening. Denies odynophagia, reflux. Able to tolerate soft foods, taking small bites. Occasionally gets choked. Unable to eat meats. She reports decreased appetite.  She has lost 11 lbs in the last 2 years.     Past Medical History  Diagnosis Date  . Thyroid goiter     s/p surgery now on Synthroid  . B12 deficiency   . Coronary artery disease   . Zenker diverticulum   . Hyperlipidemia   . Hypertension   . Osteoporosis   . GERD (gastroesophageal reflux disease)   . Hypercholesterolemia     Past Surgical History  Procedure Date  . Esophagogastroduodenoscopy 6/10     Schatzki's ring, otherwise endoscopically normal esophagus, status post dilation (8F), with mucosal disruption through the cervical segment as well as the Schatzki's ring. Hiatal hernia. segment as well as the Schatzki's ring. Hiatal hernia.  . Coronary artery bypass graft     Status Post four-vessel   . Bunionectomy   . Appendectomy   . Abdominal hysterectomy   . Esophagogastroduodenoscopy 09/02/2007    Cervical esophageal web, Schatzki's ring, status post  dilation and disruption as described above. Otherwise, normal esophagus  . Esophagogastroduodenoscopy August 2011    Zenker's diverticulum, Schatzki's ring s/p dilation, mod to large HH, instructions to pursue BPE if recurrent dysphagia to assess Zenker's    Current Outpatient Prescriptions  Medication Sig Dispense Refill  . amLODipine (NORVASC) 5 MG tablet Take 5 mg by mouth daily.       Marland Kitchen apraclonidine (IOPIDINE) 0.5 % ophthalmic solution 1  drop 2 (two) times daily.      Marland Kitchen aspirin 325 MG tablet Take 325 mg by mouth daily.      Marland Kitchen atorvastatin (LIPITOR) 10 MG tablet Take 10 mg by mouth daily.       Marland Kitchen levothyroxine (SYNTHROID, LEVOTHROID) 175 MCG tablet Take 175 mcg by mouth daily.       . quinapril (ACCUPRIL) 20 MG tablet Take 20 mg by mouth daily.       Marland Kitchen DISCONTD: atorvastatin (LIPITOR) 20 MG tablet Take 20 mg by mouth daily.        Allergies as of 02/14/2012  . (No Known Allergies)    Family History  Problem Relation Age of Onset  . Colon cancer Neg Hx     History   Social History  . Marital Status: Widowed    Spouse Name: N/A    Number of Children: N/A  . Years of Education: N/A   Social History Main Topics  . Smoking status: Never Smoker   . Smokeless tobacco: None  . Alcohol Use: No  . Drug Use: No  . Sexually Active: None   Other Topics Concern  . None   Social History Narrative  . None    Review of Systems: Gen: Denies fever, chills, anorexia. + wt loss, +lack of appetite CV: Denies chest pain, palpitations, syncope, peripheral edema, and claudication. Resp: Denies dyspnea at rest, cough, wheezing, coughing up blood, and pleurisy. GI: SEE HPI Derm: Denies rash, itching, dry skin Heme: Denies bruising, bleeding, and enlarged lymph nodes.  Physical Exam: BP 102/55  Pulse 89  Temp 97.4 F (36.3 C) (Temporal)  Ht 5\' 3"  (1.6 m)  Wt 123 lb 12.8 oz (56.155 kg)  BMI 21.93 kg/m2 General:   Alert and oriented. No distress noted. Pleasant and cooperative. HOH Head:  Normocephalic and atraumatic. Eyes:  Conjuctiva clear without scleral icterus. Mouth:  Oral mucosa pink and moist. Dentures Neck:  Supple, without mass or thyromegaly. Heart:  S1, S2 present without murmurs, rubs, or gallops. Regular rate and rhythm. Abdomen:  +BS, soft, non-tender and non-distended. No rebound or guarding. No HSM or masses noted. Msk:  Symmetrical without gross deformities.  Extremities:  Without edema. Neurologic:   Alert and  oriented x4;  grossly normal neurologically. Skin:  Intact without significant lesions or rashes. Cervical Nodes:  No significant cervical adenopathy. Psych:  Alert and cooperative. Normal mood and affect.

## 2012-02-15 ENCOUNTER — Other Ambulatory Visit: Payer: Self-pay | Admitting: Gastroenterology

## 2012-02-15 ENCOUNTER — Ambulatory Visit: Payer: MEDICARE | Admitting: Urgent Care

## 2012-02-15 ENCOUNTER — Telehealth: Payer: Self-pay | Admitting: Gastroenterology

## 2012-02-15 DIAGNOSIS — R1310 Dysphagia, unspecified: Secondary | ICD-10-CM

## 2012-02-15 NOTE — Progress Notes (Signed)
Faxed to PCP

## 2012-02-15 NOTE — Telephone Encounter (Signed)
Patient is scheduled for UGI/BPE on Thursday 08/01 at 8:30 arrive at 8:15 NPO after midnight and Im trying to reach the patient to make her aware

## 2012-02-18 NOTE — Telephone Encounter (Signed)
Patients Niece Shaune Pascal is aware of Ms.Lucks Date an time of UGI/BPE

## 2012-02-21 ENCOUNTER — Ambulatory Visit (HOSPITAL_COMMUNITY)
Admission: RE | Admit: 2012-02-21 | Discharge: 2012-02-21 | Disposition: A | Discharge status: Home / Self Care | Payer: Medicare Other | Source: Ambulatory Visit | Attending: Gastroenterology | Admitting: Gastroenterology

## 2012-02-21 DIAGNOSIS — K449 Diaphragmatic hernia without obstruction or gangrene: Secondary | ICD-10-CM | POA: Insufficient documentation

## 2012-02-21 DIAGNOSIS — R1310 Dysphagia, unspecified: Secondary | ICD-10-CM | POA: Insufficient documentation

## 2012-02-21 DIAGNOSIS — K225 Diverticulum of esophagus, acquired: Secondary | ICD-10-CM | POA: Insufficient documentation

## 2012-02-26 ENCOUNTER — Telehealth: Payer: Self-pay | Admitting: Internal Medicine

## 2012-02-26 ENCOUNTER — Other Ambulatory Visit: Payer: Self-pay | Admitting: Internal Medicine

## 2012-02-26 DIAGNOSIS — R1310 Dysphagia, unspecified: Secondary | ICD-10-CM

## 2012-02-26 NOTE — Progress Notes (Signed)
Patient is scheduled for EGD/ED on Aug 21st with RMR instructions were mailed to the patient and the patients niece Pheobe Weston Anna is aware

## 2012-02-26 NOTE — Telephone Encounter (Signed)
Routed to AS 

## 2012-02-26 NOTE — Progress Notes (Signed)
Quick Note:  Zenker's diverticulum is stable.  Lower cervical esophageal web without obstruction, no strictures. Mild esophageal dysmotility, which is to be expected. Due to her hx of multiple dilations in past, likely needs another. Last done Aug 2011. Let's set her up for an EGD/ED with RMR.  If possible, before Aug 25th so that it is within 30 days.   ______

## 2012-02-26 NOTE — Telephone Encounter (Signed)
Elizabeth [name] is asking for Ms. Vanhandel x-ray results, please advise?

## 2012-02-26 NOTE — Telephone Encounter (Signed)
Addressed under result notes.  

## 2012-02-27 ENCOUNTER — Encounter (HOSPITAL_COMMUNITY): Payer: Self-pay | Admitting: Pharmacy Technician

## 2012-03-12 ENCOUNTER — Ambulatory Visit (HOSPITAL_COMMUNITY)
Admission: RE | Admit: 2012-03-12 | Discharge: 2012-03-12 | Disposition: A | Discharge status: Home / Self Care | Payer: Medicare Other | Source: Ambulatory Visit | Attending: Internal Medicine | Admitting: Internal Medicine

## 2012-03-12 ENCOUNTER — Encounter (HOSPITAL_COMMUNITY): Payer: Self-pay | Admitting: *Deleted

## 2012-03-12 ENCOUNTER — Encounter (HOSPITAL_COMMUNITY)
Admission: RE | Disposition: A | Discharge status: Home / Self Care | Payer: Self-pay | Source: Ambulatory Visit | Attending: Internal Medicine

## 2012-03-12 DIAGNOSIS — R1310 Dysphagia, unspecified: Secondary | ICD-10-CM

## 2012-03-12 DIAGNOSIS — K225 Diverticulum of esophagus, acquired: Secondary | ICD-10-CM | POA: Insufficient documentation

## 2012-03-12 DIAGNOSIS — K222 Esophageal obstruction: Secondary | ICD-10-CM | POA: Insufficient documentation

## 2012-03-12 DIAGNOSIS — I10 Essential (primary) hypertension: Secondary | ICD-10-CM | POA: Insufficient documentation

## 2012-03-12 HISTORY — PX: ESOPHAGOGASTRODUODENOSCOPY: SHX1529

## 2012-03-12 HISTORY — DX: Hypothyroidism, unspecified: E03.9

## 2012-03-12 SURGERY — ESOPHAGOGASTRODUODENOSCOPY (EGD) WITH ESOPHAGEAL DILATION
Anesthesia: Moderate Sedation

## 2012-03-12 MED ORDER — MEPERIDINE HCL 100 MG/ML IJ SOLN
INTRAMUSCULAR | Status: AC
  Filled 2012-03-12: qty 1

## 2012-03-12 MED ORDER — SODIUM CHLORIDE 0.9 % IV SOLN: INTRAVENOUS | Status: DC

## 2012-03-12 MED ORDER — STERILE WATER FOR IRRIGATION IR SOLN
Status: DC | PRN
  Administered 2012-03-12: 13:00:00

## 2012-03-12 MED ORDER — SODIUM CHLORIDE 0.45 % IV SOLN
INTRAVENOUS | Status: DC
Start: 2012-03-12 — End: 2012-03-12
  Administered 2012-03-12: 12:00:00 via INTRAVENOUS

## 2012-03-12 MED ORDER — BUTAMBEN-TETRACAINE-BENZOCAINE 2-2-14 % EX AERO
INHALATION_SPRAY | CUTANEOUS | Status: DC | PRN
  Administered 2012-03-12: 2 via TOPICAL

## 2012-03-12 MED ORDER — MEPERIDINE HCL 100 MG/ML IJ SOLN
INTRAMUSCULAR | Status: DC | PRN
  Administered 2012-03-12 (×2): 25 mg via INTRAVENOUS

## 2012-03-12 MED ORDER — MIDAZOLAM HCL 5 MG/5ML IJ SOLN
INTRAMUSCULAR | Status: AC
  Filled 2012-03-12: qty 10

## 2012-03-12 MED ORDER — MIDAZOLAM HCL 5 MG/5ML IJ SOLN
INTRAMUSCULAR | Status: DC | PRN
  Administered 2012-03-12 (×2): 1 mg via INTRAVENOUS

## 2012-03-12 NOTE — Op Note (Signed)
[facility] [address] [place], [address]   ENDOSCOPY PROCEDURE REPORT  PATIENT: Elizabeth [name], Elizabeth [name]  MR#: [id number] BIRTHDATE: Nov 28, 1918 , 89  yrs. old GENDER: Female ENDOSCOPIST: R. Roetta Sessions, MD FACP [facility] R.  Roetta Sessions, MD FACP [facility] REFERRED BY:  [name], M.D. PROCEDURE DATE:  03/12/2012 PROCEDURE:     EGD with Elease Hashimoto dilation  INDICATIONS:      esophageal dysphagia. Abnormal barium esophagram. ( cervical esophageal web and Zenkers).  INFORMED CONSENT:   The risks, benefits, limitations, alternatives and imponderables have been discussed.  The potential for biopsy, esophogeal dilation, etc. have also been reviewed.  Questions have been answered.  All parties agreeable.  Please see the history and physical in the medical record for more information.  MEDICATIONS:   Versed 2 mg IV and Demerol 50 mg IV in divided doses.  DESCRIPTION OF PROCEDURE:   The EG-[id number] ([id number])  endoscope was introduced through the mouth and advanced to the second portion of the duodenum without difficulty or limitations.    The mucosal surfaces were surveyed very carefully during advancement of the scope and upon withdrawal.  Retroflexion view of the proximal stomach and esophagogastric junction was performed.      FINDINGS:  Small Zenker's diverticulum. Cervical esophageal web somewhat impeded passage of the gastroscope. The patient also had a Schatzki's ring which was partially dilated with passage of the scope. It was a bit tight. The intervening esophageal mucosa appeared normal. Stomach empty. 5 cm hiatal hernia. Remainder of gastric mucosa appeared normal. Pylorus patent. Normal first and second portion of the duodenum.  THERAPEUTIC / DIAGNOSTIC MANEUVERS PERFORMED:  A 54 French Maloney dilator was carefully advanced into the hypopharynx. When the patient swallowed I easily advanced it to a level below the level of the Zenker's and on down to full  insertion there was mild to moderate resistance upon full insertion. A look back revealed both the web and the ring had been disrupted nicely with minimal bleeding and without apparent complication.   COMPLICATIONS:  None  IMPRESSION:  Zenker's diverticulum (small). Cervical esophageal web and Schatzki's ring-dilated as described above. 5 cm hiatal hernia.  RECOMMENDATIONS:  Swallowing precautions reviewed. Empiric acid suppression therapy with Protonix 40 mg daily. Office visit in one year.    _______________________________ R. Roetta Sessions, MD FACP [facility] eSigned:  R. Roetta Sessions, MD FACP [facility] 03/12/2012 1:50 PM     CC:  PATIENT NAME:  Elizabeth [name], Elizabeth [name] MR#: [id number]

## 2012-03-12 NOTE — H&P (View-Only) (Signed)
Primary Care Physician:  HAWKINS,EDWARD L, MD Primary Gastroenterologist: Dr. Rourk   Chief Complaint  Patient presents with  . Dysphagia    HPI:   Ms. Casados is a pleasant 78-year-old female with hx of Zenker's diverticulum and esophageal dysphagia secondary to Schatzki's ring, s/p several dilations in the past. Last performed August 2011. She presents today again with recurrent dysphagia for the past 2 months, worsening. Denies odynophagia, reflux. Able to tolerate soft foods, taking small bites. Occasionally gets choked. Unable to eat meats. She reports decreased appetite.  She has lost 11 lbs in the last 2 years.     Past Medical History  Diagnosis Date  . Thyroid goiter     s/p surgery now on Synthroid  . B12 deficiency   . Coronary artery disease   . Zenker diverticulum   . Hyperlipidemia   . Hypertension   . Osteoporosis   . GERD (gastroesophageal reflux disease)   . Hypercholesterolemia     Past Surgical History  Procedure Date  . Esophagogastroduodenoscopy 6/10     Schatzki's ring, otherwise endoscopically normal esophagus, status post dilation (56F), with mucosal disruption through the cervical segment as well as the Schatzki's ring. Hiatal hernia. segment as well as the Schatzki's ring. Hiatal hernia.  . Coronary artery bypass graft     Status Post four-vessel   . Bunionectomy   . Appendectomy   . Abdominal hysterectomy   . Esophagogastroduodenoscopy 09/02/2007    Cervical esophageal web, Schatzki's ring, status post  dilation and disruption as described above. Otherwise, normal esophagus  . Esophagogastroduodenoscopy August 2011    Zenker's diverticulum, Schatzki's ring s/p dilation, mod to large HH, instructions to pursue BPE if recurrent dysphagia to assess Zenker's    Current Outpatient Prescriptions  Medication Sig Dispense Refill  . amLODipine (NORVASC) 5 MG tablet Take 5 mg by mouth daily.       . apraclonidine (IOPIDINE) 0.5 % ophthalmic solution 1  drop 2 (two) times daily.      . aspirin 325 MG tablet Take 325 mg by mouth daily.      . atorvastatin (LIPITOR) 10 MG tablet Take 10 mg by mouth daily.       . levothyroxine (SYNTHROID, LEVOTHROID) 175 MCG tablet Take 175 mcg by mouth daily.       . quinapril (ACCUPRIL) 20 MG tablet Take 20 mg by mouth daily.       . DISCONTD: atorvastatin (LIPITOR) 20 MG tablet Take 20 mg by mouth daily.        Allergies as of 02/14/2012  . (No Known Allergies)    Family History  Problem Relation Age of Onset  . Colon cancer Neg Hx     History   Social History  . Marital Status: Widowed    Spouse Name: N/A    Number of Children: N/A  . Years of Education: N/A   Social History Main Topics  . Smoking status: Never Smoker   . Smokeless tobacco: None  . Alcohol Use: No  . Drug Use: No  . Sexually Active: None   Other Topics Concern  . None   Social History Narrative  . None    Review of Systems: Gen: Denies fever, chills, anorexia. + wt loss, +lack of appetite CV: Denies chest pain, palpitations, syncope, peripheral edema, and claudication. Resp: Denies dyspnea at rest, cough, wheezing, coughing up blood, and pleurisy. GI: SEE HPI Derm: Denies rash, itching, dry skin Heme: Denies bruising, bleeding, and enlarged lymph nodes.    Physical Exam: BP 102/55  Pulse 89  Temp 97.4 F (36.3 C) (Temporal)  Ht 5' 3" (1.6 m)  Wt 123 lb 12.8 oz (56.155 kg)  BMI 21.93 kg/m2 General:   Alert and oriented. No distress noted. Pleasant and cooperative. HOH Head:  Normocephalic and atraumatic. Eyes:  Conjuctiva clear without scleral icterus. Mouth:  Oral mucosa pink and moist. Dentures Neck:  Supple, without mass or thyromegaly. Heart:  S1, S2 present without murmurs, rubs, or gallops. Regular rate and rhythm. Abdomen:  +BS, soft, non-tender and non-distended. No rebound or guarding. No HSM or masses noted. Msk:  Symmetrical without gross deformities.  Extremities:  Without edema. Neurologic:   Alert and  oriented x4;  grossly normal neurologically. Skin:  Intact without significant lesions or rashes. Cervical Nodes:  No significant cervical adenopathy. Psych:  Alert and cooperative. Normal mood and affect.  

## 2012-03-12 NOTE — Interval H&P Note (Signed)
History and Physical Interval Note:  03/12/2012 1:17 PM  Elizabeth [name]  has presented today for surgery, with the diagnosis of Dysphagia  The various methods of treatment have been discussed with the patient and family. After consideration of risks, benefits and other options for treatment, the patient has consented to  Procedure(s) (LRB): ESOPHAGOGASTRODUODENOSCOPY (EGD) WITH ESOPHAGEAL DILATION (N/A) as a surgical intervention .  The patient's history has been reviewed, patient examined, no change in status, stable for surgery.  I have reviewed the patient's chart and labs.  Questions were answered to the patient's satisfaction.     Sarabelle Genson  Barium esophagram demonstrated cervical web and esophageal dysmotility. Will proceed with EGD plus minus ED per original plan.    Discussed with patient.

## 2012-03-13 NOTE — Progress Notes (Signed)
REVIEWED.  

## 2012-04-28 ENCOUNTER — Emergency Department (HOSPITAL_COMMUNITY)
Admission: EM | Admit: 2012-04-28 | Discharge: 2012-04-28 | Disposition: A | Discharge status: Home / Self Care | Payer: Medicare Other | Attending: Emergency Medicine | Admitting: Emergency Medicine

## 2012-04-28 ENCOUNTER — Encounter (HOSPITAL_COMMUNITY): Payer: Self-pay | Admitting: *Deleted

## 2012-04-28 DIAGNOSIS — I2510 Atherosclerotic heart disease of native coronary artery without angina pectoris: Secondary | ICD-10-CM | POA: Insufficient documentation

## 2012-04-28 DIAGNOSIS — N390 Urinary tract infection, site not specified: Secondary | ICD-10-CM | POA: Insufficient documentation

## 2012-04-28 DIAGNOSIS — K219 Gastro-esophageal reflux disease without esophagitis: Secondary | ICD-10-CM | POA: Insufficient documentation

## 2012-04-28 DIAGNOSIS — I10 Essential (primary) hypertension: Secondary | ICD-10-CM | POA: Insufficient documentation

## 2012-04-28 DIAGNOSIS — E860 Dehydration: Secondary | ICD-10-CM

## 2012-04-28 DIAGNOSIS — Z800 Family history of malignant neoplasm of digestive organs: Secondary | ICD-10-CM | POA: Insufficient documentation

## 2012-04-28 DIAGNOSIS — Z7982 Long term (current) use of aspirin: Secondary | ICD-10-CM | POA: Insufficient documentation

## 2012-04-28 LAB — CBC
HCT: 32.2 % — ABNORMAL LOW (ref 36.0–46.0)
Hemoglobin: 11 g/dL — ABNORMAL LOW (ref 12.0–15.0)
MCH: 27.8 pg (ref 26.0–34.0)
MCHC: 34.2 g/dL (ref 30.0–36.0)
MCV: 81.3 fL (ref 78.0–100.0)

## 2012-04-28 LAB — URINALYSIS, ROUTINE W REFLEX MICROSCOPIC
Nitrite: NEGATIVE
Specific Gravity, Urine: 1.02 (ref 1.005–1.030)
Urobilinogen, UA: 0.2 mg/dL (ref 0.0–1.0)

## 2012-04-28 LAB — BASIC METABOLIC PANEL
BUN: 32 mg/dL — ABNORMAL HIGH (ref 6–23)
CO2: 25 mEq/L (ref 19–32)
Calcium: 9.2 mg/dL (ref 8.4–10.5)
GFR calc non Af Amer: 31 mL/min — ABNORMAL LOW (ref >90)
Glucose, Bld: 103 mg/dL — ABNORMAL HIGH (ref 70–99)

## 2012-04-28 LAB — URINE MICROSCOPIC-ADD ON

## 2012-04-28 MED ORDER — SULFAMETHOXAZOLE-TMP DS 800-160 MG PO TABS
1.0000 | ORAL_TABLET | Freq: Once | ORAL | Status: AC
  Administered 2012-04-28: 1 via ORAL
  Filled 2012-04-28: qty 1

## 2012-04-28 MED ORDER — SULFAMETHOXAZOLE-TRIMETHOPRIM 800-160 MG PO TABS: 1.0000 | ORAL_TABLET | Freq: Two times a day (BID) | ORAL | Status: DC

## 2012-04-28 NOTE — ED Provider Notes (Signed)
History     CSN: [id number]  Arrival date & time 04/28/12  1747   First MD Initiated Contact with Patient 04/28/12 1812      Chief Complaint  Patient presents with  . Dysuria    (Consider location/radiation/quality/duration/timing/severity/associated sxs/prior treatment) Patient is a 78 y.o. female presenting with dysuria. The history is provided by the patient.  Dysuria  This is a new problem. The current episode started 2 days ago. The problem has been gradually worsening. The quality of the pain is described as burning. The pain is at a severity of 1/10. The pain is mild. There has been no fever. She is not sexually active. Associated symptoms include frequency and urgency. Pertinent negatives include no chills, no sweats, no vomiting, no discharge, no hematuria, no hesitancy, no possible pregnancy and no flank pain. She has tried nothing for the symptoms. Her past medical history does not include kidney stones, urological procedure, recurrent UTIs or catheterization.    Past Medical History  Diagnosis Date  . Thyroid goiter     s/p surgery now on Synthroid  . B12 deficiency   . Coronary artery disease   . Zenker diverticulum   . Hyperlipidemia   . Hypertension   . Osteoporosis   . GERD (gastroesophageal reflux disease)   . Hypercholesterolemia   . Hypothyroidism     Past Surgical History  Procedure Date  . Esophagogastroduodenoscopy 6/10     Schatzki's ring, otherwise endoscopically normal esophagus, status post dilation (73F), with mucosal disruption through the cervical segment as well as the Schatzki's ring. Hiatal hernia. segment as well as the Schatzki's ring. Hiatal hernia.  . Coronary artery bypass graft     Status Post four-vessel   . Bunionectomy   . Appendectomy   . Abdominal hysterectomy   . Esophagogastroduodenoscopy 09/02/2007    Cervical esophageal web, Schatzki's ring, status post  dilation and disruption as described above. Otherwise, normal esophagus    . Esophagogastroduodenoscopy August 2011    Zenker's diverticulum, Schatzki's ring s/p dilation, mod to large HH, instructions to pursue BPE if recurrent dysphagia to assess Zenker's    Family History  Problem Relation Age of Onset  . Colon cancer Neg Hx     History  Substance Use Topics  . Smoking status: Never Smoker   . Smokeless tobacco: Not on file  . Alcohol Use: No    OB History    Grav Para Term Preterm Abortions TAB SAB Ect Mult Living                  Review of Systems  Constitutional: Negative for chills.  HENT: Negative.   Eyes: Negative.   Gastrointestinal: Negative for vomiting.  Genitourinary: Positive for dysuria, urgency and frequency. Negative for hesitancy, hematuria and flank pain.  All other systems reviewed and are negative.    Allergies  Review of patient's allergies indicates no known allergies.  Home Medications   Current Outpatient Rx  Name Route Sig Dispense Refill  . AMLODIPINE BESYLATE 5 MG PO TABS Oral Take 5 mg by mouth daily.     . APRACLONIDINE HCL 0.5 % OP SOLN Both Eyes Place 1 drop into both eyes 2 (two) times daily.     . ASPIRIN 325 MG PO TABS Oral Take 325 mg by mouth daily.    . ATORVASTATIN CALCIUM 10 MG PO TABS Oral Take 10 mg by mouth daily.     Marland Kitchen LEVOTHYROXINE SODIUM 175 MCG PO TABS Oral Take 175 mcg by  mouth daily.     . QUINAPRIL HCL 20 MG PO TABS Oral Take 20 mg by mouth daily.       BP 123/81  Pulse 88  Temp 97.6 F (36.4 C) (Oral)  Resp 22  Ht 5\' 4"  (1.626 m)  Wt 120 lb (54.432 kg)  BMI 20.60 kg/m2  SpO2 99%  Physical Exam  Nursing note and vitals reviewed. Constitutional: She is oriented to person, place, and time. She appears well-developed and well-nourished. No distress.  HENT:  Head: Normocephalic and atraumatic.  Right Ear: External ear normal.  Left Ear: External ear normal.  Nose: Nose normal.  Mouth/Throat: Oropharynx is clear and moist.  Eyes: Conjunctivae normal are normal. Pupils are  equal, round, and reactive to light. Right eye exhibits no discharge. Left eye exhibits no discharge. No scleral icterus.  Neck: Normal range of motion. Neck supple. No JVD present. No tracheal deviation present.  Cardiovascular: Normal rate, regular rhythm, normal heart sounds and intact distal pulses.  Exam reveals no gallop and no friction rub.   No murmur heard. Pulmonary/Chest: Effort normal and breath sounds normal. No stridor. No respiratory distress. She has no wheezes. She has no rales. She exhibits no tenderness.  Abdominal: Soft. Bowel sounds are normal. She exhibits no shifting dullness, no distension, no abdominal bruit, no ascites, no pulsatile midline mass and no mass. There is no hepatosplenomegaly. There is tenderness in the suprapubic area. There is no rigidity, no rebound, no guarding, no CVA tenderness, no tenderness at McBurney's point and negative Murphy's sign. No hernia.       Very mild discomfort  Musculoskeletal: Normal range of motion. She exhibits no edema and no tenderness.  Lymphadenopathy:    She has no cervical adenopathy.  Neurological: She is alert and oriented to person, place, and time. No cranial nerve deficit.  Skin: Skin is warm and dry. No rash noted. She is not diaphoretic. No erythema. No pallor.  Psychiatric: She has a normal mood and affect. Her behavior is normal.    ED Course  Procedures (including critical care time)   Labs Reviewed  URINALYSIS, ROUTINE W REFLEX MICROSCOPIC  URINE CULTURE  CBC  BASIC METABOLIC PANEL   No results found.   No diagnosis found.    MDM  Pt presents for evaluation of increased urinary frequency and mild dysuria.  She appears nontoxic, has nl vital signs, NAD.  Plan basic labs an U/A, will reassess as results become available.  2030.  Pt stable, NAD.  Note U/A consistent with a UTI.  Pt has no clinical evidence of sepsis.  She appears to have some mild dehydration also.  PO bactrim administered.  Urine  culture has also been obtained.  Pt encouraged to drink plenty of fluids and follow-up with her primary physician.      Tobin Chad, MD 04/28/12 2036

## 2012-04-28 NOTE — ED Notes (Signed)
Urinary frequency,No nausea,  Denies pain

## 2012-04-28 NOTE — ED Notes (Signed)
Pt with burning on urination and frequent urination since last night, urine sent to lab

## 2012-04-30 LAB — URINE CULTURE

## 2012-05-02 NOTE — ED Notes (Signed)
+   Urine Chart sent to EDP office for review. 

## 2012-05-07 NOTE — ED Notes (Signed)
Check to see if patient has followed up with her PCP and if he changed her antibiotic regimen. If not patient will likely need keflex 500 mg QID x 7 days per Felicie Morn PA-C.

## 2012-05-08 ENCOUNTER — Telehealth (HOSPITAL_COMMUNITY): Payer: Self-pay | Admitting: *Deleted

## 2012-05-10 ENCOUNTER — Telehealth (HOSPITAL_COMMUNITY): Payer: Self-pay | Admitting: *Deleted

## 2012-07-18 ENCOUNTER — Encounter (HOSPITAL_COMMUNITY): Payer: Self-pay | Admitting: *Deleted

## 2012-07-18 ENCOUNTER — Inpatient Hospital Stay (HOSPITAL_COMMUNITY)
Admission: EM | Admit: 2012-07-18 | Discharge: 2012-07-23 | DRG: 392 | Disposition: A | Discharge status: Home / Self Care | Payer: Medicare Other | Attending: Pulmonary Disease | Admitting: Pulmonary Disease

## 2012-07-18 DIAGNOSIS — E039 Hypothyroidism, unspecified: Secondary | ICD-10-CM | POA: Diagnosis present

## 2012-07-18 DIAGNOSIS — Z951 Presence of aortocoronary bypass graft: Secondary | ICD-10-CM

## 2012-07-18 DIAGNOSIS — K5289 Other specified noninfective gastroenteritis and colitis: Principal | ICD-10-CM | POA: Diagnosis present

## 2012-07-18 DIAGNOSIS — R197 Diarrhea, unspecified: Secondary | ICD-10-CM | POA: Diagnosis present

## 2012-07-18 DIAGNOSIS — E871 Hypo-osmolality and hyponatremia: Secondary | ICD-10-CM | POA: Diagnosis present

## 2012-07-18 DIAGNOSIS — K222 Esophageal obstruction: Secondary | ICD-10-CM | POA: Diagnosis present

## 2012-07-18 DIAGNOSIS — E538 Deficiency of other specified B group vitamins: Secondary | ICD-10-CM | POA: Diagnosis present

## 2012-07-18 DIAGNOSIS — E785 Hyperlipidemia, unspecified: Secondary | ICD-10-CM | POA: Diagnosis present

## 2012-07-18 DIAGNOSIS — I2510 Atherosclerotic heart disease of native coronary artery without angina pectoris: Secondary | ICD-10-CM | POA: Diagnosis present

## 2012-07-18 DIAGNOSIS — Z79899 Other long term (current) drug therapy: Secondary | ICD-10-CM

## 2012-07-18 DIAGNOSIS — J189 Pneumonia, unspecified organism: Secondary | ICD-10-CM

## 2012-07-18 DIAGNOSIS — K529 Noninfective gastroenteritis and colitis, unspecified: Secondary | ICD-10-CM | POA: Diagnosis present

## 2012-07-18 DIAGNOSIS — R1310 Dysphagia, unspecified: Secondary | ICD-10-CM | POA: Diagnosis present

## 2012-07-18 DIAGNOSIS — B9789 Other viral agents as the cause of diseases classified elsewhere: Secondary | ICD-10-CM | POA: Diagnosis present

## 2012-07-18 DIAGNOSIS — Z7982 Long term (current) use of aspirin: Secondary | ICD-10-CM

## 2012-07-18 DIAGNOSIS — E7800 Pure hypercholesterolemia, unspecified: Secondary | ICD-10-CM | POA: Diagnosis present

## 2012-07-18 DIAGNOSIS — M810 Age-related osteoporosis without current pathological fracture: Secondary | ICD-10-CM | POA: Diagnosis present

## 2012-07-18 DIAGNOSIS — B349 Viral infection, unspecified: Secondary | ICD-10-CM | POA: Diagnosis present

## 2012-07-18 DIAGNOSIS — E860 Dehydration: Secondary | ICD-10-CM

## 2012-07-18 DIAGNOSIS — K219 Gastro-esophageal reflux disease without esophagitis: Secondary | ICD-10-CM | POA: Diagnosis present

## 2012-07-18 DIAGNOSIS — I10 Essential (primary) hypertension: Secondary | ICD-10-CM | POA: Diagnosis present

## 2012-07-18 NOTE — ED Notes (Addendum)
Pt c/o chills, diarrhea, abdominal pain, and nausea/vomiting

## 2012-07-19 ENCOUNTER — Observation Stay (HOSPITAL_COMMUNITY): Payer: Medicare Other

## 2012-07-19 ENCOUNTER — Emergency Department (HOSPITAL_COMMUNITY): Payer: Medicare Other

## 2012-07-19 ENCOUNTER — Encounter (HOSPITAL_COMMUNITY): Payer: Self-pay

## 2012-07-19 DIAGNOSIS — J189 Pneumonia, unspecified organism: Secondary | ICD-10-CM

## 2012-07-19 DIAGNOSIS — R197 Diarrhea, unspecified: Secondary | ICD-10-CM | POA: Diagnosis present

## 2012-07-19 DIAGNOSIS — E871 Hypo-osmolality and hyponatremia: Secondary | ICD-10-CM

## 2012-07-19 DIAGNOSIS — B349 Viral infection, unspecified: Secondary | ICD-10-CM | POA: Diagnosis present

## 2012-07-19 DIAGNOSIS — E860 Dehydration: Secondary | ICD-10-CM

## 2012-07-19 DIAGNOSIS — B9789 Other viral agents as the cause of diseases classified elsewhere: Secondary | ICD-10-CM

## 2012-07-19 LAB — LIPASE, BLOOD: Lipase: 28 U/L (ref 11–59)

## 2012-07-19 LAB — COMPREHENSIVE METABOLIC PANEL
ALT: 10 U/L (ref 0–35)
Alkaline Phosphatase: 65 U/L (ref 39–117)
BUN: 31 mg/dL — ABNORMAL HIGH (ref 6–23)
CO2: 22 mEq/L (ref 19–32)
GFR calc Af Amer: 48 mL/min — ABNORMAL LOW (ref >90)
GFR calc non Af Amer: 42 mL/min — ABNORMAL LOW (ref >90)
Glucose, Bld: 136 mg/dL — ABNORMAL HIGH (ref 70–99)
Potassium: 4 mEq/L (ref 3.5–5.1)
Sodium: 131 mEq/L — ABNORMAL LOW (ref 135–145)
Total Bilirubin: 0.5 mg/dL (ref 0.3–1.2)
Total Protein: 5.7 g/dL — ABNORMAL LOW (ref 6.0–8.3)

## 2012-07-19 LAB — INFLUENZA PANEL BY PCR (TYPE A & B)
H1N1 flu by pcr: NOT DETECTED
Influenza B By PCR: NEGATIVE

## 2012-07-19 LAB — CBC WITH DIFFERENTIAL/PLATELET
Eosinophils Absolute: 0 10*3/uL (ref 0.0–0.7)
Hemoglobin: 10.5 g/dL — ABNORMAL LOW (ref 12.0–15.0)
Lymphocytes Relative: 14 % (ref 12–46)
Lymphs Abs: 1.6 10*3/uL (ref 0.7–4.0)
MCH: 27.6 pg (ref 26.0–34.0)
MCV: 81.8 fL (ref 78.0–100.0)
Monocytes Relative: 6 % (ref 3–12)
Neutrophils Relative %: 80 % — ABNORMAL HIGH (ref 43–77)
RBC: 3.8 MIL/uL — ABNORMAL LOW (ref 3.87–5.11)
WBC: 11.7 10*3/uL — ABNORMAL HIGH (ref 4.0–10.5)

## 2012-07-19 LAB — URINE MICROSCOPIC-ADD ON

## 2012-07-19 LAB — URINALYSIS, ROUTINE W REFLEX MICROSCOPIC
Glucose, UA: NEGATIVE mg/dL
Ketones, ur: NEGATIVE mg/dL
Protein, ur: NEGATIVE mg/dL
Urobilinogen, UA: 0.2 mg/dL (ref 0.0–1.0)

## 2012-07-19 MED ORDER — DEXTROSE 5 % IV SOLN
1.0000 g | INTRAVENOUS | Status: DC
  Filled 2012-07-19 (×2): qty 10

## 2012-07-19 MED ORDER — PANTOPRAZOLE SODIUM 40 MG PO TBEC
40.0000 mg | DELAYED_RELEASE_TABLET | Freq: Every day | ORAL | Status: DC
  Administered 2012-07-19 – 2012-07-23 (×5): 40 mg via ORAL
  Filled 2012-07-19 (×5): qty 1

## 2012-07-19 MED ORDER — OSELTAMIVIR PHOSPHATE 75 MG PO CAPS: 75.0000 mg | ORAL_CAPSULE | Freq: Two times a day (BID) | ORAL | Status: DC

## 2012-07-19 MED ORDER — GUAIFENESIN 100 MG/5ML PO SYRP
200.0000 mg | ORAL_SOLUTION | ORAL | Status: DC | PRN
  Filled 2012-07-19: qty 118

## 2012-07-19 MED ORDER — ACETAMINOPHEN 325 MG PO TABS: 650.0000 mg | ORAL_TABLET | Freq: Four times a day (QID) | ORAL | Status: DC | PRN

## 2012-07-19 MED ORDER — CIPROFLOXACIN IN D5W 400 MG/200ML IV SOLN
400.0000 mg | Freq: Two times a day (BID) | INTRAVENOUS | Status: DC
  Administered 2012-07-19: 400 mg via INTRAVENOUS
  Filled 2012-07-19 (×5): qty 200

## 2012-07-19 MED ORDER — FLEET ENEMA 7-19 GM/118ML RE ENEM: 1.0000 | ENEMA | Freq: Every day | RECTAL | Status: DC | PRN

## 2012-07-19 MED ORDER — GUAIFENESIN 100 MG/5ML PO SOLN: 200.0000 mg | ORAL | Status: DC | PRN

## 2012-07-19 MED ORDER — IOHEXOL 300 MG/ML  SOLN
100.0000 mL | Freq: Once | INTRAMUSCULAR | Status: AC | PRN
  Administered 2012-07-19: 100 mL via INTRAVENOUS

## 2012-07-19 MED ORDER — OSELTAMIVIR PHOSPHATE 75 MG PO CAPS
75.0000 mg | ORAL_CAPSULE | Freq: Two times a day (BID) | ORAL | Status: DC
  Administered 2012-07-19 – 2012-07-20 (×4): 75 mg via ORAL
  Filled 2012-07-19 (×4): qty 1

## 2012-07-19 MED ORDER — DEXTROSE 5 % IV SOLN
500.0000 mg | Freq: Once | INTRAVENOUS | Status: AC
  Administered 2012-07-19: 500 mg via INTRAVENOUS
  Filled 2012-07-19: qty 500

## 2012-07-19 MED ORDER — DEXTROSE 5 % IV SOLN
500.0000 mg | INTRAVENOUS | Status: DC
  Filled 2012-07-19 (×2): qty 500

## 2012-07-19 MED ORDER — CIPROFLOXACIN IN D5W 400 MG/200ML IV SOLN
400.0000 mg | INTRAVENOUS | Status: DC
  Administered 2012-07-20 – 2012-07-22 (×3): 400 mg via INTRAVENOUS
  Filled 2012-07-19 (×3): qty 200

## 2012-07-19 MED ORDER — CEFTRIAXONE SODIUM 1 G IJ SOLR: 1.0000 g | Freq: Once | INTRAMUSCULAR | Status: DC

## 2012-07-19 MED ORDER — ENOXAPARIN SODIUM 30 MG/0.3ML ~~LOC~~ SOLN
30.0000 mg | SUBCUTANEOUS | Status: DC
  Administered 2012-07-19 – 2012-07-23 (×5): 30 mg via SUBCUTANEOUS
  Filled 2012-07-19 (×5): qty 0.3

## 2012-07-19 MED ORDER — POLYETHYLENE GLYCOL 3350 17 G PO PACK
17.0000 g | PACK | Freq: Every day | ORAL | Status: DC
  Administered 2012-07-20: 17 g via ORAL
  Filled 2012-07-19 (×4): qty 1

## 2012-07-19 MED ORDER — METOPROLOL SUCCINATE ER 50 MG PO TB24
50.0000 mg | ORAL_TABLET | Freq: Every day | ORAL | Status: DC
  Administered 2012-07-19 – 2012-07-23 (×4): 50 mg via ORAL
  Filled 2012-07-19 (×5): qty 1

## 2012-07-19 MED ORDER — ONDANSETRON HCL 4 MG/2ML IJ SOLN: 4.0000 mg | Freq: Four times a day (QID) | INTRAMUSCULAR | Status: DC | PRN

## 2012-07-19 MED ORDER — METRONIDAZOLE IN NACL 5-0.79 MG/ML-% IV SOLN
500.0000 mg | Freq: Four times a day (QID) | INTRAVENOUS | Status: DC
  Administered 2012-07-19 – 2012-07-22 (×13): 500 mg via INTRAVENOUS
  Filled 2012-07-19 (×14): qty 100

## 2012-07-19 MED ORDER — LISINOPRIL 10 MG PO TABS
20.0000 mg | ORAL_TABLET | Freq: Every day | ORAL | Status: DC
  Administered 2012-07-19 – 2012-07-23 (×5): 20 mg via ORAL
  Filled 2012-07-19: qty 1
  Filled 2012-07-19 (×4): qty 2
  Filled 2012-07-19 (×2): qty 1
  Filled 2012-07-19: qty 2

## 2012-07-19 MED ORDER — DEXTROSE 5 % IV SOLN
INTRAVENOUS | Status: AC
  Filled 2012-07-19: qty 500

## 2012-07-19 MED ORDER — AMLODIPINE BESYLATE 5 MG PO TABS
5.0000 mg | ORAL_TABLET | Freq: Every day | ORAL | Status: DC
  Administered 2012-07-19 – 2012-07-23 (×5): 5 mg via ORAL
  Filled 2012-07-19 (×5): qty 1

## 2012-07-19 MED ORDER — ATORVASTATIN CALCIUM 10 MG PO TABS
10.0000 mg | ORAL_TABLET | Freq: Every evening | ORAL | Status: DC
Start: 2012-07-19 — End: 2012-07-23
  Administered 2012-07-19 – 2012-07-22 (×4): 10 mg via ORAL
  Filled 2012-07-19 (×5): qty 1

## 2012-07-19 MED ORDER — LEVOTHYROXINE SODIUM 75 MCG PO TABS
75.0000 ug | ORAL_TABLET | Freq: Every day | ORAL | Status: DC
  Administered 2012-07-19 – 2012-07-23 (×5): 75 ug via ORAL
  Filled 2012-07-19 (×6): qty 1

## 2012-07-19 MED ORDER — DEXTROSE 5 % IV SOLN
1.0000 g | Freq: Once | INTRAVENOUS | Status: AC
  Administered 2012-07-19: 1 g via INTRAVENOUS
  Filled 2012-07-19: qty 10

## 2012-07-19 MED ORDER — ASPIRIN 325 MG PO TABS
325.0000 mg | ORAL_TABLET | Freq: Every day | ORAL | Status: DC
  Administered 2012-07-19 – 2012-07-23 (×5): 325 mg via ORAL
  Filled 2012-07-19 (×5): qty 1

## 2012-07-19 MED ORDER — SORBITOL 70 % SOLN
30.0000 mL | Freq: Every day | Status: DC | PRN
  Filled 2012-07-19: qty 30

## 2012-07-19 NOTE — Progress Notes (Signed)
ANTIBIOTIC CONSULT NOTE - INITIAL  Pharmacy Consult for Renal Adjustment Antibiotics Indication: Colitis  No Known Allergies  Patient Measurements: Height: 5\' 5"  (165.1 cm) Weight: 103 lb (46.72 kg) IBW/kg (Calculated) : 57    Vital Signs: Temp: 98.1 F (36.7 C) (12/28 0639) Temp src: Oral (12/28 0639) BP: 123/67 mmHg (12/28 0639) Pulse Rate: 89  (12/28 0639) Intake/Output from previous day: 12/27 0701 - 12/28 0700 In: 800 [I.V.:800] Out: -  Intake/Output from this shift:    Labs:  [facility] 07/19/12 0156  WBC 11.7*  HGB 10.5*  PLT 142*  LABCREA --  CREATININE 1.12*   Estimated Creatinine Clearance: 24.1 ml/min (by C-G formula based on Cr of 1.12). No results found for this basename: VANCOTROUGH:2,VANCOPEAK:2,VANCORANDOM:2,GENTTROUGH:2,GENTPEAK:2,GENTRANDOM:2,TOBRATROUGH:2,TOBRAPEAK:2,TOBRARND:2,AMIKACINPEAK:2,AMIKACINTROU:2,AMIKACIN:2, in the last 72 hours   Microbiology: Recent Results (from the past 720 hour(s))  CULTURE, BLOOD (ROUTINE X 2)     Status: Normal (Preliminary result)   Collection Time   07/19/12  4:07 AM      Component Value Range Status Comment   Specimen Description BLOOD RIGHT HAND   Final    Special Requests BOTTLES DRAWN AEROBIC AND ANAEROBIC 10CC EACH   Final    Culture NO GROWTH <24 HRS   Final    Report Status PENDING   Incomplete   CULTURE, BLOOD (ROUTINE X 2)     Status: Normal (Preliminary result)   Collection Time   07/19/12  4:07 AM      Component Value Range Status Comment   Specimen Description BLOOD LEFT HAND   Final    Special Requests BOTTLES DRAWN AEROBIC ONLY 4CC   Final    Culture NO GROWTH <24 HRS   Final    Report Status PENDING   Incomplete     Medical History: Past Medical History  Diagnosis Date  . Thyroid goiter     s/p surgery now on Synthroid  . B12 deficiency   . Coronary artery disease   . Zenker diverticulum   . Hyperlipidemia   . Hypertension   . Osteoporosis   . GERD (gastroesophageal reflux  disease)   . Hypercholesterolemia   . Hypothyroidism     Medications:  Scheduled:    . amLODipine  5 mg Oral Daily  . aspirin  325 mg Oral Daily  . atorvastatin  10 mg Oral QPM  . [COMPLETED] azithromycin  500 mg Intravenous Once  . [COMPLETED] cefTRIAXone (ROCEPHIN)  IV  1 g Intravenous Once  . ciprofloxacin  400 mg Intravenous Q24H  . enoxaparin (LOVENOX) injection  30 mg Subcutaneous Q24H  . levothyroxine  75 mcg Oral QAC breakfast  . lisinopril  20 mg Oral Daily  . metoprolol succinate  50 mg Oral Daily  . metronidazole  500 mg Intravenous Q6H  . oseltamivir  75 mg Oral BID  . pantoprazole  40 mg Oral Daily  . polyethylene glycol  17 g Oral Daily  . [DISCONTINUED] azithromycin  500 mg Intravenous Q24H  . [DISCONTINUED] cefTRIAXone (ROCEPHIN)  IV  1 g Intravenous Q24H  . [DISCONTINUED] cefTRIAXone  1 g Intramuscular Once  . [DISCONTINUED] ciprofloxacin  400 mg Intravenous Q12H  . [DISCONTINUED] oseltamivir  75 mg Oral BID   Assessment: Initial antibiotics for pneumonia, now discontinued Antibiotics for colitis, Cipro and Flagyl CrCl 24.1 ml/min  Goal of Therapy:  Eradicate infection  Plan:  Reduce Cipro to 400 mg IV every 24 hours (CrCl < 30) No change in Flagyl 500 mg IV every 8 hours Monitor renal function Labs  per protocol   Elizabeth [name], Elizabeth [name] 07/19/2012,9:07 AM

## 2012-07-19 NOTE — ED Notes (Signed)
Called ac about medication not in pyxis

## 2012-07-19 NOTE — Progress Notes (Signed)
Dr. Wilhemina Bonito notified of CT of the abdomen results.

## 2012-07-19 NOTE — Progress Notes (Signed)
Subjective: She was admitted with what seems to of been a viral illness. There was concern that she had community-acquired pneumonia but her CT scan did not show that. It did however show some evidence of colitis.  Objective: Vital signs in last 24 hours: Temp:  [98.1 F (36.7 C)-100.3 F (37.9 C)] 98.1 F (36.7 C) (12/28 0639) Pulse Rate:  [89-98] 89  (12/28 0639) Resp:  [20] 20  (12/28 0639) BP: (111-123)/(50-67) 123/67 mmHg (12/28 0639) SpO2:  [92 %-100 %] 98 % (12/28 0639) Weight:  [46.72 kg (103 lb)] 46.72 kg (103 lb) (12/27 2301) Weight change:     Intake/Output from previous day: 12/27 0701 - 12/28 0700 In: 800 [I.V.:800] Out: -   PHYSICAL EXAM General appearance: alert and moderate distress Resp: rhonchi bilaterally Cardio: regular rate and rhythm, S1, S2 normal, no murmur, click, rub or gallop GI: mildly tender Extremities: extremities normal, atraumatic, no cyanosis or edema  Lab Results:    Basic Metabolic Panel:  Basename 07/19/12 0156  NA 131*  K 4.0  CL 101  CO2 22  GLUCOSE 136*  BUN 31*  CREATININE 1.12*  CALCIUM 8.4  MG --  PHOS --   Liver Function Tests:  [facility] 07/19/12 0156  AST 17  ALT 10  ALKPHOS 65  BILITOT 0.5  PROT 5.7*  ALBUMIN 2.7*    Basename 07/19/12 0156  LIPASE 28  AMYLASE --   No results found for this basename: AMMONIA:2 in the last 72 hours CBC:  Basename 07/19/12 0156  WBC 11.7*  NEUTROABS 9.4*  HGB 10.5*  HCT 31.1*  MCV 81.8  PLT 142*   Cardiac Enzymes:  Basename 07/19/12 0156  CKTOTAL --  CKMB --  CKMBINDEX --  TROPONINI <0.30   BNP: No results found for this basename: PROBNP:3 in the last 72 hours D-Dimer: No results found for this basename: DDIMER:2 in the last 72 hours CBG: No results found for this basename: GLUCAP:6 in the last 72 hours Hemoglobin A1C: No results found for this basename: HGBA1C in the last 72 hours Fasting Lipid Panel: No results found for this basename:  CHOL,HDL,LDLCALC,TRIG,CHOLHDL,LDLDIRECT in the last 72 hours Thyroid Function Tests: No results found for this basename: TSH,T4TOTAL,FREET4,T3FREE,THYROIDAB in the last 72 hours Anemia Panel: No results found for this basename: VITAMINB12,FOLATE,FERRITIN,TIBC,IRON,RETICCTPCT in the last 72 hours Coagulation: No results found for this basename: LABPROT:2,INR:2 in the last 72 hours Urine Drug Screen: Drugs of Abuse  No results found for this basename: labopia, cocainscrnur, labbenz, amphetmu, thcu, labbarb    Alcohol Level: No results found for this basename: ETH:2 in the last 72 hours Urinalysis:  Basename 07/19/12 0258  COLORURINE YELLOW  LABSPEC 1.020  PHURINE 5.5  GLUCOSEU NEGATIVE  HGBUR LARGE*  BILIRUBINUR NEGATIVE  KETONESUR NEGATIVE  PROTEINUR NEGATIVE  UROBILINOGEN 0.2  NITRITE NEGATIVE  LEUKOCYTESUR SMALL*   Misc. Labs:  ABGS No results found for this basename: PHART,PCO2,PO2ART,TCO2,HCO3 in the last 72 hours CULTURES Recent Results (from the past 240 hour(s))  CULTURE, BLOOD (ROUTINE X 2)     Status: Normal (Preliminary result)   Collection Time   07/19/12  4:07 AM      Component Value Range Status Comment   Specimen Description BLOOD RIGHT HAND   Final    Special Requests BOTTLES DRAWN AEROBIC AND ANAEROBIC 10CC EACH   Final    Culture NO GROWTH <24 HRS   Final    Report Status PENDING   Incomplete   CULTURE, BLOOD (ROUTINE X 2)  Status: Normal (Preliminary result)   Collection Time   07/19/12  4:07 AM      Component Value Range Status Comment   Specimen Description BLOOD LEFT HAND   Final    Special Requests BOTTLES DRAWN AEROBIC ONLY 4CC   Final    Culture NO GROWTH <24 HRS   Final    Report Status PENDING   Incomplete    Studies/Results: Dg Chest 2 View  07/19/2012  *RADIOLOGY REPORT*  Clinical Data: Chills, diarrhea, vomiting and cough.  Lower abdominal pain.  CHEST - 2 VIEW  Comparison: Chest radiograph performed 01/11/2011  Findings: The lungs  are well-aerated.  Mild bibasilar airspace opacities, left greater than right, may reflect atelectasis or possibly pneumonia.  No definite pleural effusion or pneumothorax is seen.  The heart is borderline normal in size.  The patient is status post median sternotomy, with evidence of prior CABG.  Calcification is noted within the aortic arch.  No acute osseous abnormalities are seen.  IMPRESSION: Mild bibasilar airspace opacities, left greater than right, may reflect atelectasis or possibly pneumonia.   Original Report Authenticated By: Tonia Ghent, M.D.    Ct Chest Wo Contrast  07/19/2012  *RADIOLOGY REPORT*  Clinical Data: Cough, chills, diarrhea and vomiting.  Abdominal pain.  CT CHEST WITHOUT CONTRAST  Technique:  Multidetector CT imaging of the chest was performed following the standard protocol without IV contrast.  Comparison: Chest radiograph performed earlier today at 02:37 a.m.  Findings: Mild atelectasis is noted at the lung bases.  There is mild diffuse prominence of the vasculature.  The lungs are otherwise clear.  No focal airspace consolidation, pleural effusion or pneumothorax is seen.  Scattered calcification is noted at the mitral and aortic valves. The mediastinum is otherwise unremarkable in appearance.  A borderline prominent precarinal node is seen, measuring 1.1 cm in short axis; this has a normal fatty hilum, and is likely within normal limits.  No pericardial effusion is identified.  The great vessels are grossly unremarkable in appearance.  The patient is status post median sternotomy.  A moderate hiatal hernia is seen.  The visualized portions of the liver and spleen are unremarkable.  Scattered calcifications noted along the descending thoracic and proximal abdominal aorta.  There is soft tissue inflammation tracking about the visualized portions of the splenic flexure of the colon; would suggest clinical correlation for signs of colitis.  No acute osseous abnormalities are  identified.  Mild degenerative change is noted at the lower cervical spine.  IMPRESSION:  1.  Soft tissue inflammation noted tracking about the visualized portions of the splenic flexure of the colon.  Suggest clinical correlation for evidence of colitis. 2.  Mild atelectasis at the lung bases; lungs otherwise clear. 3.  Mild vascular congestion suggested. 4.  Borderline prominent precarinal node, measuring 1.1 cm in short axis; given its normal fatty hilum, this is likely within normal limits. 5.  Moderate hiatal hernia noted. 6.  Scattered vascular calcifications seen.   Original Report Authenticated By: Tonia Ghent, M.D.    Dg Abd 2 Views  07/19/2012  *RADIOLOGY REPORT*  Clinical Data: Lower abdominal pain.  ABDOMEN - 2 VIEW  Comparison: Abdominal radiograph performed 03/03/2005  Findings: The visualized bowel gas pattern is unremarkable. Scattered air and stool filled loops of colon are seen; no abnormal dilatation of small bowel loops is seen to suggest small bowel obstruction.  No free intra-abdominal air is identified, though evaluation for free air is limited on a single supine view.  Degenerative change is noted at the lower lumbar spine, and mild sclerotic change is seen at the sacroiliac joints.  Airspace opacities noted on the chest radiograph are not readily characterized on the current study.  The patient is status post median sternotomy.  IMPRESSION:  1.  Unremarkable bowel gas pattern; no free intra-abdominal air seen. 2.  Degenerative change at the lower lumbar spine.   Original Report Authenticated By: Tonia Ghent, M.D.     Medications:  Prior to Admission:  Prescriptions prior to admission  Medication Sig Dispense Refill  . amLODipine (NORVASC) 5 MG tablet Take 5 mg by mouth daily after breakfast.       . aspirin 325 MG tablet Take 325 mg by mouth daily.      Marland Kitchen atorvastatin (LIPITOR) 10 MG tablet Take 10 mg by mouth every evening.       . lansoprazole (PREVACID) 30 MG capsule  Take 30 mg by mouth daily after breakfast.      . levothyroxine (SYNTHROID, LEVOTHROID) 75 MCG tablet Take 75 mcg by mouth daily after breakfast.      . metoprolol succinate (TOPROL-XL) 50 MG 24 hr tablet Take 50 mg by mouth daily after breakfast. Take with or immediately following a meal.      . quinapril (ACCUPRIL) 20 MG tablet Take 20 mg by mouth daily after breakfast.       . sulfamethoxazole-trimethoprim (SEPTRA DS) 800-160 MG per tablet Take 1 tablet by mouth every 12 (twelve) hours.  14 tablet  0   Scheduled:   . amLODipine  5 mg Oral Daily  . aspirin  325 mg Oral Daily  . atorvastatin  10 mg Oral QPM  . ciprofloxacin  400 mg Intravenous Q12H  . enoxaparin (LOVENOX) injection  30 mg Subcutaneous Q24H  . levothyroxine  75 mcg Oral QAC breakfast  . lisinopril  20 mg Oral Daily  . metoprolol succinate  50 mg Oral Daily  . metronidazole  500 mg Intravenous Q6H  . oseltamivir  75 mg Oral BID  . pantoprazole  40 mg Oral Daily  . polyethylene glycol  17 g Oral Daily   Continuous:  WUJ:[id number], guaiFENesin, ondansetron (ZOFRAN) IV, sodium phosphate, sorbitol  Assesment: She has a viral syndrome and abdominal pain she may have colitis. I don't think she has pneumonia so I'm going to discontinue the medications for pneumonia and switch her to Cipro and Flagyl Principal Problem:  *Viral syndrome Active Problems:  ESOPHAGEAL STRICTURE  GERD  DYSPHAGIA UNSPECIFIED  CAP (community acquired pneumonia), possibly  Dehydration  Diarrhea  Hyponatremia    Plan: As above I will also get a CT abdomen    LOS: 1 day   Raeann Offner L 07/19/2012, 8:23 AM

## 2012-07-19 NOTE — H&P (Signed)
[facility] History and Physical  Elizabeth [name]  BJY:[id number]  DOB: 19-Aug-1918   DOA: 07/19/2012   PCP:   Fredirick Maudlin, MD   Chief Complaint:  Feel sick, unclear duration  HPI: Elizabeth [name] is an 78 y.o. female.   Elderly Caucasian lady who lives alone, and who reports that she did get her flu shot, brought to the emergency room complaining of diarrhea and, "feeling as if she is been hit by a truck".  The time of my interview patient only repeats that she feels really sick; and seems to be too sick to respond reliably to specific questions. Earlier in the emergency room she complained of coughing and diarrhea. Patient is coughing during the interview.  Unable to get a specific history of fever chills body aches or nasal congestion. Denies vomiting  Review of medical records reveals a history of dysphasia with recent esophageal dilation; also recent UTI treatment.  Rewiew of Systems:   Unable to obtain full review of systems due to patient's acute illness    Past Medical History  Diagnosis Date  . Thyroid goiter     s/p surgery now on Synthroid  . B12 deficiency   . Coronary artery disease   . Zenker diverticulum   . Hyperlipidemia   . Hypertension   . Osteoporosis   . GERD (gastroesophageal reflux disease)   . Hypercholesterolemia   . Hypothyroidism     Past Surgical History  Procedure Date  . Esophagogastroduodenoscopy 6/10     Schatzki's ring, otherwise endoscopically normal esophagus, status post dilation (25F), with mucosal disruption through the cervical segment as well as the Schatzki's ring. Hiatal hernia. segment as well as the Schatzki's ring. Hiatal hernia.  . Coronary artery bypass graft     Status Post four-vessel   . Bunionectomy   . Appendectomy   . Abdominal hysterectomy   . Esophagogastroduodenoscopy 09/02/2007    Cervical esophageal web, Schatzki's ring, status post  dilation and disruption as described above. Otherwise, normal  esophagus  . Esophagogastroduodenoscopy August 2011    Zenker's diverticulum, Schatzki's ring s/p dilation, mod to large HH, instructions to pursue BPE if recurrent dysphagia to assess Zenker's  . Esophageal dilation     Medications:  HOME MEDS: Prior to Admission medications   Medication Sig Start Date End Date Taking? Authorizing Provider  amLODipine (NORVASC) 5 MG tablet Take 5 mg by mouth daily after breakfast.  12/10/11   Historical Provider, MD  aspirin 325 MG tablet Take 325 mg by mouth daily.    Historical Provider, MD  atorvastatin (LIPITOR) 10 MG tablet Take 10 mg by mouth every evening.  12/10/11   Historical Provider, MD  lansoprazole (PREVACID) 30 MG capsule Take 30 mg by mouth daily after breakfast.    Historical Provider, MD  levothyroxine (SYNTHROID, LEVOTHROID) 75 MCG tablet Take 75 mcg by mouth daily after breakfast.    Historical Provider, MD  metoprolol succinate (TOPROL-XL) 50 MG 24 hr tablet Take 50 mg by mouth daily after breakfast. Take with or immediately following a meal.    Historical Provider, MD  quinapril (ACCUPRIL) 20 MG tablet Take 20 mg by mouth daily after breakfast.  12/10/11   Historical Provider, MD  sulfamethoxazole-trimethoprim (SEPTRA DS) 800-160 MG per tablet Take 1 tablet by mouth every 12 (twelve) hours. 04/28/12   Tobin Chad, MD     Allergies:  No Known Allergies  Social History:   reports that she has never smoked. She does not have any  smokeless tobacco history on file. She reports that she does not drink alcohol or use illicit drugs.  Family History: Family History  Problem Relation Age of Onset  . Colon cancer Neg Hx      Physical Exam: Filed Vitals:   07/19/12 0145 07/19/12 0200 07/19/12 0500 07/19/12 0546  BP:  118/53 116/50   Pulse: 93  90   Temp:    100.3 F (37.9 C)  TempSrc:    Oral  Resp:      Height:      Weight:      SpO2: 93%  92%    Blood pressure 116/50, pulse 90, temperature 100.3 F (37.9 C), temperature  source Oral, resp. rate 20, height 5\' 5"  (1.651 m), weight 46.72 kg (103 lb), SpO2 92.00%.  GEN:  Ill-looking elderly Caucasian lady lying in the stretcher,  Looking sick and miserable; cooperative with exam PSYCH:  alert and oriented ; appears somewhat anxious; affect is appropriate. HEENT: Mucous membranes pink, dry, and anicteric; PERRLA; EOM intact; no cervical lymphadenopathy nor thyromegaly or carotid bruit; no JVD; Breasts:: Not examined CHEST WALL: No tenderness CHEST: Tachypneic; bibasilar basal crackles posteriorly HEART: Regular rate and rhythm; no murmurs rubs or gallops BACK: Mild kyphosis  no CVA tenderness ABDOMEN: O soft non-tender; no masses, no organomegaly, normal abdominal bowel sounds; no pannus; no intertriginous candida. Rectal Exam: Not done EXTREMITIES:  age-appropriate arthropathy of the hands and knees; no edema; no ulcerations. Genitalia: not examined PULSES: 2+ and symmetric SKIN: Normal hydration no rash or ulceration CNS: Cranial nerves 2-12 grossly intact no focal lateralizing neurologic deficit   Labs on Admission:  Basic Metabolic Panel:  Lab 07/19/12 1610  NA 131*  K 4.0  CL 101  CO2 22  GLUCOSE 136*  BUN 31*  CREATININE 1.12*  CALCIUM 8.4  MG --  PHOS --   Liver Function Tests:  Lab 07/19/12 0156  AST 17  ALT 10  ALKPHOS 65  BILITOT 0.5  PROT 5.7*  ALBUMIN 2.7*    Lab 07/19/12 0156  LIPASE 28  AMYLASE --   No results found for this basename: AMMONIA:5 in the last 168 hours CBC:  Lab 07/19/12 0156  WBC 11.7*  NEUTROABS 9.4*  HGB 10.5*  HCT 31.1*  MCV 81.8  PLT 142*   Cardiac Enzymes:  Lab 07/19/12 0156  CKTOTAL --  CKMB --  CKMBINDEX --  TROPONINI <0.30   BNP: No components found with this basename: POCBNP:5 D-dimer: No components found with this basename: D-DIMER:5 CBG: No results found for this basename: GLUCAP:5 in the last 168 hours  Radiological Exams on Admission: Dg Chest 2 View  07/19/2012   *RADIOLOGY REPORT*  Clinical Data: Chills, diarrhea, vomiting and cough.  Lower abdominal pain.  CHEST - 2 VIEW  Comparison: Chest radiograph performed 01/11/2011  Findings: The lungs are well-aerated.  Mild bibasilar airspace opacities, left greater than right, may reflect atelectasis or possibly pneumonia.  No definite pleural effusion or pneumothorax is seen.  The heart is borderline normal in size.  The patient is status post median sternotomy, with evidence of prior CABG.  Calcification is noted within the aortic arch.  No acute osseous abnormalities are seen.  IMPRESSION: Mild bibasilar airspace opacities, left greater than right, may reflect atelectasis or possibly pneumonia.   Original Report Authenticated By: Tonia Ghent, M.D.    Dg Abd 2 Views  07/19/2012  *RADIOLOGY REPORT*  Clinical Data: Lower abdominal pain.  ABDOMEN - 2 VIEW  Comparison: Abdominal  radiograph performed 03/03/2005  Findings: The visualized bowel gas pattern is unremarkable. Scattered air and stool filled loops of colon are seen; no abnormal dilatation of small bowel loops is seen to suggest small bowel obstruction.  No free intra-abdominal air is identified, though evaluation for free air is limited on a single supine view.  Degenerative change is noted at the lower lumbar spine, and mild sclerotic change is seen at the sacroiliac joints.  Airspace opacities noted on the chest radiograph are not readily characterized on the current study.  The patient is status post median sternotomy.  IMPRESSION:  1.  Unremarkable bowel gas pattern; no free intra-abdominal air seen. 2.  Degenerative change at the lower lumbar spine.   Original Report Authenticated By: Tonia Ghent, M.D.      Assessment/Plan Present on Admission:  . Viral syndrome . CAP (community acquired pneumonia), possibly . GERD . ESOPHAGEAL STRICTURE . DYSPHAGIA UNSPECIFIED . Dehydration . Diarrhea . Hyponatremia   PLAN: Patient appears to have a viral  syndrome; chest x-ray is not convincing for pneumonia;  Will get an influenza nasal swab, and start empiric treatment for flu;  will continue pneumonia therapy for the time being. We'll get a plain CT scan of the chest to rule in or rule out pneumonia. We'll hydrate.  If diarrhea persists in hospital, will likely benefit from C.difff PCR, in view of her recent antibiotic exposure  Will continue her home medications as listed in her current med rec, but the pharmacy med rec is pending  Other plans as per orders.  Code Status: Presumed full code; unable to get CODE STATUS at this time Family Communication: Patient lives alone no family member present at this time Disposition Plan:; This is an inpatient admission; primary care physician will assume care in a few hours    Zitlali Primm Nocturnist [facility] Pager [phone]   07/19/2012, 6:21 AM

## 2012-07-19 NOTE — ED Provider Notes (Signed)
History     CSN: [id number]  Arrival date & time 07/18/12  2242   First MD Initiated Contact with Patient 07/19/12 0040      Chief Complaint  Patient presents with  . Chills  . Diarrhea  . Emesis  . Abdominal Pain  . Cough   Most history provided by son-patient is extremely hard of hearing. (Consider location/radiation/quality/duration/timing/severity/associated sxs/prior treatment) HPIEstelle L [name] is a 78 y.o. female who presents with her son with a days worth of abdominal pain, chills, diarrhea, nausea and vomiting. Son says she's also had a cough today. He says she's "not acting right." He says "this is how she was acting when she had a urinary tract infection" he is referring to an admission back in October. The symptoms have been severe, started today, no alleviating or exacerbating factors and no associated symptoms.   Past Medical History  Diagnosis Date  . Thyroid goiter     s/p surgery now on Synthroid  . B12 deficiency   . Coronary artery disease   . Zenker diverticulum   . Hyperlipidemia   . Hypertension   . Osteoporosis   . GERD (gastroesophageal reflux disease)   . Hypercholesterolemia   . Hypothyroidism     Past Surgical History  Procedure Date  . Esophagogastroduodenoscopy 6/10     Schatzki's ring, otherwise endoscopically normal esophagus, status post dilation (37F), with mucosal disruption through the cervical segment as well as the Schatzki's ring. Hiatal hernia. segment as well as the Schatzki's ring. Hiatal hernia.  . Coronary artery bypass graft     Status Post four-vessel   . Bunionectomy   . Appendectomy   . Abdominal hysterectomy   . Esophagogastroduodenoscopy 09/02/2007    Cervical esophageal web, Schatzki's ring, status post  dilation and disruption as described above. Otherwise, normal esophagus  . Esophagogastroduodenoscopy August 2011    Zenker's diverticulum, Schatzki's ring s/p dilation, mod to large HH, instructions to pursue BPE if  recurrent dysphagia to assess Zenker's    Family History  Problem Relation Age of Onset  . Colon cancer Neg Hx     History  Substance Use Topics  . Smoking status: Never Smoker   . Smokeless tobacco: Not on file  . Alcohol Use: No    OB History    Grav Para Term Preterm Abortions TAB SAB Ect Mult Living                  Review of Systems At least 10pt or greater review of systems completed and are negative except where specified in the HPI.  Allergies  Review of patient's allergies indicates no known allergies.  Home Medications   Current Outpatient Rx  Name  Route  Sig  Dispense  Refill  . AMLODIPINE BESYLATE 5 MG PO TABS   Oral   Take 5 mg by mouth daily after breakfast.          . ASPIRIN 325 MG PO TABS   Oral   Take 325 mg by mouth daily.         . ATORVASTATIN CALCIUM 10 MG PO TABS   Oral   Take 10 mg by mouth every evening.          Marland Kitchen LANSOPRAZOLE 30 MG PO CPDR   Oral   Take 30 mg by mouth daily after breakfast.         . LEVOTHYROXINE SODIUM 75 MCG PO TABS   Oral   Take 75 mcg by mouth daily  after breakfast.         . METOPROLOL SUCCINATE ER 50 MG PO TB24   Oral   Take 50 mg by mouth daily after breakfast. Take with or immediately following a meal.         . QUINAPRIL HCL 20 MG PO TABS   Oral   Take 20 mg by mouth daily after breakfast.          . SULFAMETHOXAZOLE-TRIMETHOPRIM 800-160 MG PO TABS   Oral   Take 1 tablet by mouth every 12 (twelve) hours.   14 tablet   0     BP 111/51  Pulse 98  Temp 98.4 F (36.9 C) (Oral)  Ht 5\' 5"  (1.651 m)  Wt 103 lb (46.72 kg)  BMI 17.14 kg/m2  SpO2 100%  Physical Exam  Nursing notes reviewed.  Electronic medical record reviewed. VITAL SIGNS:   Filed Vitals:   07/19/12 0200 07/19/12 0500 07/19/12 0546 07/19/12 0639  BP: 118/53 116/50  123/67  Pulse:  90  89  Temp:   100.3 F (37.9 C) 98.1 F (36.7 C)  TempSrc:   Oral Oral  Resp:    20  Height:      Weight:      SpO2:  92%   98%   CONSTITUTIONAL: Awake, oriented to self, place and month - she is confused when it comes to the year and day, appears non-toxic HENT: Atraumatic, normocephalic, oral mucosa pink and moist, airway patent. Nares patent without drainage. External ears normal. EYES: Conjunctiva clear, EOMI, PERRLA NECK: Trachea midline, non-tender, supple, well-healed horizontal scar above her sternal notch CARDIOVASCULAR: Normal heart rate, Normal rhythm, 2/6 systolic ejection murmur right upper sternal border murmurs, no rubs or gallops PULMONARY/CHEST: Clear to auscultation, no rhonchi, wheezes, or rales. Symmetrical breath sounds. Non-tender. Well-healed median sternotomy scar ABDOMINAL: Non-distended, soft, non-tender - no rebound or guarding.  BS normal. NEUROLOGIC: Non-focal, moving all four extremities, no gross sensory or motor deficits. EXTREMITIES: No clubbing, cyanosis, or edema SKIN: Warm, Dry, No erythema, No rash  ED Course  Procedures (including critical care time)  Labs Reviewed  CBC WITH DIFFERENTIAL - Abnormal; Notable for the following:    WBC 11.7 (*)     RBC 3.80 (*)     Hemoglobin 10.5 (*)     HCT 31.1 (*)     Platelets 142 (*)     Neutrophils Relative 80 (*)     Neutro Abs 9.4 (*)     All other components within normal limits  COMPREHENSIVE METABOLIC PANEL - Abnormal; Notable for the following:    Sodium 131 (*)     Glucose, Bld 136 (*)     BUN 31 (*)     Creatinine, Ser 1.12 (*)     Total Protein 5.7 (*)     Albumin 2.7 (*)     GFR calc non Af Amer 42 (*)     GFR calc Af Amer 48 (*)     All other components within normal limits  URINALYSIS, ROUTINE W REFLEX MICROSCOPIC - Abnormal; Notable for the following:    Hgb urine dipstick LARGE (*)     Leukocytes, UA SMALL (*)     All other components within normal limits  URINE MICROSCOPIC-ADD ON - Abnormal; Notable for the following:    Squamous Epithelial / LPF FEW (*)     Bacteria, UA MANY (*)     All other components  within normal limits  LIPASE, BLOOD  TROPONIN I  CULTURE, BLOOD (ROUTINE X 2)  CULTURE, BLOOD (ROUTINE X 2)  INFLUENZA PANEL BY PCR  CULTURE, EXPECTORATED SPUTUM-ASSESSMENT  GRAM STAIN  RESPIRATORY VIRUS PANEL  LEGIONELLA ANTIGEN, URINE  STREP PNEUMONIAE URINARY ANTIGEN   Dg Chest 2 View  07/19/2012  *RADIOLOGY REPORT*  Clinical Data: Chills, diarrhea, vomiting and cough.  Lower abdominal pain.  CHEST - 2 VIEW  Comparison: Chest radiograph performed 01/11/2011  Findings: The lungs are well-aerated.  Mild bibasilar airspace opacities, left greater than right, may reflect atelectasis or possibly pneumonia.  No definite pleural effusion or pneumothorax is seen.  The heart is borderline normal in size.  The patient is status post median sternotomy, with evidence of prior CABG.  Calcification is noted within the aortic arch.  No acute osseous abnormalities are seen.  IMPRESSION: Mild bibasilar airspace opacities, left greater than right, may reflect atelectasis or possibly pneumonia.   Original Report Authenticated By: Tonia Ghent, M.D.    Ct Chest Wo Contrast  07/19/2012  *RADIOLOGY REPORT*  Clinical Data: Cough, chills, diarrhea and vomiting.  Abdominal pain.  CT CHEST WITHOUT CONTRAST  Technique:  Multidetector CT imaging of the chest was performed following the standard protocol without IV contrast.  Comparison: Chest radiograph performed earlier today at 02:37 a.m.  Findings: Mild atelectasis is noted at the lung bases.  There is mild diffuse prominence of the vasculature.  The lungs are otherwise clear.  No focal airspace consolidation, pleural effusion or pneumothorax is seen.  Scattered calcification is noted at the mitral and aortic valves. The mediastinum is otherwise unremarkable in appearance.  A borderline prominent precarinal node is seen, measuring 1.1 cm in short axis; this has a normal fatty hilum, and is likely within normal limits.  No pericardial effusion is identified.  The great  vessels are grossly unremarkable in appearance.  The patient is status post median sternotomy.  A moderate hiatal hernia is seen.  The visualized portions of the liver and spleen are unremarkable.  Scattered calcifications noted along the descending thoracic and proximal abdominal aorta.  There is soft tissue inflammation tracking about the visualized portions of the splenic flexure of the colon; would suggest clinical correlation for signs of colitis.  No acute osseous abnormalities are identified.  Mild degenerative change is noted at the lower cervical spine.  IMPRESSION:  1.  Soft tissue inflammation noted tracking about the visualized portions of the splenic flexure of the colon.  Suggest clinical correlation for evidence of colitis. 2.  Mild atelectasis at the lung bases; lungs otherwise clear. 3.  Mild vascular congestion suggested. 4.  Borderline prominent precarinal node, measuring 1.1 cm in short axis; given its normal fatty hilum, this is likely within normal limits. 5.  Moderate hiatal hernia noted. 6.  Scattered vascular calcifications seen.   Original Report Authenticated By: Tonia Ghent, M.D.    Dg Abd 2 Views  07/19/2012  *RADIOLOGY REPORT*  Clinical Data: Lower abdominal pain.  ABDOMEN - 2 VIEW  Comparison: Abdominal radiograph performed 03/03/2005  Findings: The visualized bowel gas pattern is unremarkable. Scattered air and stool filled loops of colon are seen; no abnormal dilatation of small bowel loops is seen to suggest small bowel obstruction.  No free intra-abdominal air is identified, though evaluation for free air is limited on a single supine view.  Degenerative change is noted at the lower lumbar spine, and mild sclerotic change is seen at the sacroiliac joints.  Airspace opacities noted on the chest radiograph are not readily characterized on the  current study.  The patient is status post median sternotomy.  IMPRESSION:  1.  Unremarkable bowel gas pattern; no free intra-abdominal  air seen. 2.  Degenerative change at the lower lumbar spine.   Original Report Authenticated By: Tonia Ghent, M.D.      1. CAP (community acquired pneumonia)   2. Viral syndrome   3. Dehydration   4. Hyponatremia       MDM  Patient presents with son concerns for infection this is how she acted when she had a UTI. She says she's had some frequency but denies dysuria. He does also mention she's had a cough for a day or so. Also complains of chills, diarrhea and nausea and vomiting-could be atypical or acute gastroenteritis with an upper respiratory type viral infection as well. Obtain basic labs as well as chest x-ray.    chest x-ray is suggestive of bibasilar infiltrates versus atelectasis, x-ray the abdomen is unremarkable, labs show patient does have a mild increase in her white count at 11.7. She is mildly dehydrated, does not have a urinary tract infection. Obtain blood cultures and start antibiotics.  07/19/2012 5:21 AM D/W Dr. Orvan Falconer for admission for Dr. Berenice Primas, MD 07/19/12 0800

## 2012-07-20 LAB — BASIC METABOLIC PANEL
BUN: 22 mg/dL (ref 6–23)
Calcium: 7.7 mg/dL — ABNORMAL LOW (ref 8.4–10.5)
GFR calc non Af Amer: 42 mL/min — ABNORMAL LOW (ref >90)
Glucose, Bld: 96 mg/dL (ref 70–99)
Sodium: 135 mEq/L (ref 135–145)

## 2012-07-20 LAB — CBC
MCH: 27.3 pg (ref 26.0–34.0)
MCHC: 33.2 g/dL (ref 30.0–36.0)
Platelets: 133 10*3/uL — ABNORMAL LOW (ref 150–400)

## 2012-07-20 LAB — STREP PNEUMONIAE URINARY ANTIGEN: Strep Pneumo Urinary Antigen: NEGATIVE

## 2012-07-20 MED ORDER — POTASSIUM CHLORIDE CRYS ER 20 MEQ PO TBCR
20.0000 meq | EXTENDED_RELEASE_TABLET | Freq: Two times a day (BID) | ORAL | Status: DC
  Administered 2012-07-20 – 2012-07-23 (×7): 20 meq via ORAL
  Filled 2012-07-20 (×7): qty 1

## 2012-07-20 NOTE — Progress Notes (Signed)
Subjective: She feels better. She's not having as much abdominal discomfort. She's not having nausea or vomiting.  Objective: Vital signs in last 24 hours: Temp:  [99.1 F (37.3 C)-100.3 F (37.9 C)] 99.1 F (37.3 C) (12/29 0500) Pulse Rate:  [77-86] 77  (12/29 0500) Resp:  [16-20] 18  (12/29 0500) BP: (94-120)/(46-83) 107/46 mmHg (12/29 0500) SpO2:  [94 %-96 %] 94 % (12/29 0500) Weight change:  Last BM Date: 07/19/12  Intake/Output from previous day: 12/28 0701 - 12/29 0700 In: 820 [P.O.:320; IV Piggyback:500] Out: 275 [Urine:275]  PHYSICAL EXAM General appearance: alert, cooperative and mild distress Resp: clear to auscultation bilaterally Cardio: regular rate and rhythm, S1, S2 normal, no murmur, click, rub or gallop GI: Mildly diffusely tender Extremities: extremities normal, atraumatic, no cyanosis or edema  Lab Results:    Basic Metabolic Panel:  Basename 07/20/12 0607 07/19/12 0156  NA 135 131*  K 3.3* 4.0  CL 106 101  CO2 22 22  GLUCOSE 96 136*  BUN 22 31*  CREATININE 1.12* 1.12*  CALCIUM 7.7* 8.4  MG -- --  PHOS -- --   Liver Function Tests:  [facility] 07/19/12 0156  AST 17  ALT 10  ALKPHOS 65  BILITOT 0.5  PROT 5.7*  ALBUMIN 2.7*    Basename 07/19/12 0156  LIPASE 28  AMYLASE --   No results found for this basename: AMMONIA:2 in the last 72 hours CBC:  Basename 07/20/12 0607 07/19/12 0156  WBC 7.5 11.7*  NEUTROABS -- 9.4*  HGB 9.9* 10.5*  HCT 29.8* 31.1*  MCV 82.1 81.8  PLT 133* 142*   Cardiac Enzymes:  Basename 07/19/12 0156  CKTOTAL --  CKMB --  CKMBINDEX --  TROPONINI <0.30   BNP: No results found for this basename: PROBNP:3 in the last 72 hours D-Dimer: No results found for this basename: DDIMER:2 in the last 72 hours CBG: No results found for this basename: GLUCAP:6 in the last 72 hours Hemoglobin A1C: No results found for this basename: HGBA1C in the last 72 hours Fasting Lipid Panel: No results found for this  basename: CHOL,HDL,LDLCALC,TRIG,CHOLHDL,LDLDIRECT in the last 72 hours Thyroid Function Tests: No results found for this basename: TSH,T4TOTAL,FREET4,T3FREE,THYROIDAB in the last 72 hours Anemia Panel: No results found for this basename: VITAMINB12,FOLATE,FERRITIN,TIBC,IRON,RETICCTPCT in the last 72 hours Coagulation: No results found for this basename: LABPROT:2,INR:2 in the last 72 hours Urine Drug Screen: Drugs of Abuse  No results found for this basename: labopia, cocainscrnur, labbenz, amphetmu, thcu, labbarb    Alcohol Level: No results found for this basename: ETH:2 in the last 72 hours Urinalysis:  Basename 07/19/12 0258  COLORURINE YELLOW  LABSPEC 1.020  PHURINE 5.5  GLUCOSEU NEGATIVE  HGBUR LARGE*  BILIRUBINUR NEGATIVE  KETONESUR NEGATIVE  PROTEINUR NEGATIVE  UROBILINOGEN 0.2  NITRITE NEGATIVE  LEUKOCYTESUR SMALL*   Misc. Labs:  ABGS No results found for this basename: PHART,PCO2,PO2ART,TCO2,HCO3 in the last 72 hours CULTURES Recent Results (from the past 240 hour(s))  CULTURE, BLOOD (ROUTINE X 2)     Status: Normal (Preliminary result)   Collection Time   07/19/12  4:07 AM      Component Value Range Status Comment   Specimen Description BLOOD RIGHT HAND   Final    Special Requests BOTTLES DRAWN AEROBIC AND ANAEROBIC 10CC EACH   Final    Culture NO GROWTH 1 DAY   Final    Report Status PENDING   Incomplete   CULTURE, BLOOD (ROUTINE X 2)     Status: Normal (  Preliminary result)   Collection Time   07/19/12  4:07 AM      Component Value Range Status Comment   Specimen Description BLOOD LEFT HAND   Final    Special Requests BOTTLES DRAWN AEROBIC ONLY 4CC   Final    Culture NO GROWTH 1 DAY   Final    Report Status PENDING   Incomplete   CLOSTRIDIUM DIFFICILE BY PCR     Status: Normal   Collection Time   07/19/12 12:52 PM      Component Value Range Status Comment   C difficile by pcr NEGATIVE  NEGATIVE Final    Studies/Results: Dg Chest 2  View  07/19/2012  *RADIOLOGY REPORT*  Clinical Data: Chills, diarrhea, vomiting and cough.  Lower abdominal pain.  CHEST - 2 VIEW  Comparison: Chest radiograph performed 01/11/2011  Findings: The lungs are well-aerated.  Mild bibasilar airspace opacities, left greater than right, may reflect atelectasis or possibly pneumonia.  No definite pleural effusion or pneumothorax is seen.  The heart is borderline normal in size.  The patient is status post median sternotomy, with evidence of prior CABG.  Calcification is noted within the aortic arch.  No acute osseous abnormalities are seen.  IMPRESSION: Mild bibasilar airspace opacities, left greater than right, may reflect atelectasis or possibly pneumonia.   Original Report Authenticated By: Tonia Ghent, M.D.    Ct Chest Wo Contrast  07/19/2012  *RADIOLOGY REPORT*  Clinical Data: Cough, chills, diarrhea and vomiting.  Abdominal pain.  CT CHEST WITHOUT CONTRAST  Technique:  Multidetector CT imaging of the chest was performed following the standard protocol without IV contrast.  Comparison: Chest radiograph performed earlier today at 02:37 a.m.  Findings: Mild atelectasis is noted at the lung bases.  There is mild diffuse prominence of the vasculature.  The lungs are otherwise clear.  No focal airspace consolidation, pleural effusion or pneumothorax is seen.  Scattered calcification is noted at the mitral and aortic valves. The mediastinum is otherwise unremarkable in appearance.  A borderline prominent precarinal node is seen, measuring 1.1 cm in short axis; this has a normal fatty hilum, and is likely within normal limits.  No pericardial effusion is identified.  The great vessels are grossly unremarkable in appearance.  The patient is status post median sternotomy.  A moderate hiatal hernia is seen.  The visualized portions of the liver and spleen are unremarkable.  Scattered calcifications noted along the descending thoracic and proximal abdominal aorta.  There  is soft tissue inflammation tracking about the visualized portions of the splenic flexure of the colon; would suggest clinical correlation for signs of colitis.  No acute osseous abnormalities are identified.  Mild degenerative change is noted at the lower cervical spine.  IMPRESSION:  1.  Soft tissue inflammation noted tracking about the visualized portions of the splenic flexure of the colon.  Suggest clinical correlation for evidence of colitis. 2.  Mild atelectasis at the lung bases; lungs otherwise clear. 3.  Mild vascular congestion suggested. 4.  Borderline prominent precarinal node, measuring 1.1 cm in short axis; given its normal fatty hilum, this is likely within normal limits. 5.  Moderate hiatal hernia noted. 6.  Scattered vascular calcifications seen.   Original Report Authenticated By: Tonia Ghent, M.D.    Ct Abdomen Pelvis W Contrast  07/19/2012  *RADIOLOGY REPORT*  Clinical Data: Abdominal pain, diarrhea, and vomiting.  Colitis.  CT ABDOMEN AND PELVIS WITH CONTRAST  Technique:  Multidetector CT imaging of the abdomen and pelvis was  performed following the standard protocol during bolus administration of intravenous contrast.  Contrast: OMNIPAQUE IOHEXOL 300 MG/ML  SOLN  Comparison: None  Findings: A large hiatal hernia is demonstrated.  There is moderate colonic wall thickening localized to the splenic flexure of colon and proximal descending colon, consistent with colitis.  This distribution raises suspicion for ischemic colitis, although the differential diagnosis also includes infectious colitis.  There is no evidence of pneumatosis, extraluminal air, or abscess.  A small amount of free fluid is seen in the right pelvis.  Prior hysterectomy noted.  The adnexal regions are otherwise unremarkable in appearance.  Mild bilateral renal parenchymal scarring is seen, without evidence of mass or hydronephrosis.  Atherosclerotic calcification of abdominal aorta is seen without evidence of  aneurysm.  The liver, gallbladder, pancreas, and adrenal glands are normal in appearance. No soft tissue masses or lymphadenopathy identified.  IMPRESSION:  1.  Moderate colitis involving the splenic flexure.  This distribution raises suspicion for ischemic colitis, although the differential diagnosis also includes infectious etiologies. 2.  Small amount of free fluid in pelvis.  No evidence of abscess or other complication. 3.  Large hiatal hernia.  Critical Value/emergent results were called by telephone at the time of interpretation on 07/19/2012 at 1225 hours to the patient's floor nurse Darel Hong, who verbally acknowledged these results.   Original Report Authenticated By: Myles Rosenthal, M.D.    Dg Abd 2 Views  07/19/2012  *RADIOLOGY REPORT*  Clinical Data: Lower abdominal pain.  ABDOMEN - 2 VIEW  Comparison: Abdominal radiograph performed 03/03/2005  Findings: The visualized bowel gas pattern is unremarkable. Scattered air and stool filled loops of colon are seen; no abnormal dilatation of small bowel loops is seen to suggest small bowel obstruction.  No free intra-abdominal air is identified, though evaluation for free air is limited on a single supine view.  Degenerative change is noted at the lower lumbar spine, and mild sclerotic change is seen at the sacroiliac joints.  Airspace opacities noted on the chest radiograph are not readily characterized on the current study.  The patient is status post median sternotomy.  IMPRESSION:  1.  Unremarkable bowel gas pattern; no free intra-abdominal air seen. 2.  Degenerative change at the lower lumbar spine.   Original Report Authenticated By: Tonia Ghent, M.D.     Medications:  Prior to Admission:  Prescriptions prior to admission  Medication Sig Dispense Refill  . amLODipine (NORVASC) 5 MG tablet Take 5 mg by mouth daily after breakfast.       . aspirin 325 MG tablet Take 325 mg by mouth daily.      . lansoprazole (PREVACID) 30 MG capsule Take 30 mg by  mouth daily after breakfast.      . levothyroxine (SYNTHROID, LEVOTHROID) 112 MCG tablet Take 112 mcg by mouth daily.      . metoprolol succinate (TOPROL-XL) 50 MG 24 hr tablet Take 50 mg by mouth daily after breakfast. Take with or immediately following a meal.      . quinapril (ACCUPRIL) 20 MG tablet Take 20 mg by mouth daily after breakfast.       . atorvastatin (LIPITOR) 10 MG tablet Take 10 mg by mouth every evening.        Scheduled:   . amLODipine  5 mg Oral Daily  . aspirin  325 mg Oral Daily  . atorvastatin  10 mg Oral QPM  . ciprofloxacin  400 mg Intravenous Q24H  . enoxaparin (LOVENOX) injection  30 mg  Subcutaneous Q24H  . levothyroxine  75 mcg Oral QAC breakfast  . lisinopril  20 mg Oral Daily  . metoprolol succinate  50 mg Oral Daily  . metronidazole  500 mg Intravenous Q6H  . oseltamivir  75 mg Oral BID  . pantoprazole  40 mg Oral Daily  . polyethylene glycol  17 g Oral Daily  . potassium chloride  20 mEq Oral BID   Continuous:  WUJ:[id number], guaiFENesin, ondansetron (ZOFRAN) IV, sodium phosphate, sorbitol  Assesment: She has colitis by CT scan. She feels somewhat better with treatment. I don't think she has pneumonia. Her dehydration is better Principal Problem:  *Viral syndrome Active Problems:  ESOPHAGEAL STRICTURE  GERD  DYSPHAGIA UNSPECIFIED  CAP (community acquired pneumonia), possibly  Dehydration  Diarrhea  Hyponatremia    Plan: Continue with Cipro Flagyl and I will ask for GI consultation    LOS: 2 days   Rinnah Peppel L 07/20/2012, 10:39 AM

## 2012-07-21 ENCOUNTER — Encounter (HOSPITAL_COMMUNITY): Payer: Self-pay | Admitting: Urgent Care

## 2012-07-21 DIAGNOSIS — K529 Noninfective gastroenteritis and colitis, unspecified: Secondary | ICD-10-CM | POA: Diagnosis present

## 2012-07-21 DIAGNOSIS — K5289 Other specified noninfective gastroenteritis and colitis: Secondary | ICD-10-CM

## 2012-07-21 LAB — LEGIONELLA ANTIGEN, URINE

## 2012-07-21 NOTE — Progress Notes (Signed)
Subjective: She has no new complaints except problems with her IV access. She's not having any nausea or vomiting.  Objective: Vital signs in last 24 hours: Temp:  [97.5 F (36.4 C)-98 F (36.7 C)] 98 F (36.7 C) (12/30 0500) Pulse Rate:  [67-106] 73  (12/30 0500) Resp:  [16-20] 16  (12/30 0500) BP: (94-116)/(47-69) 116/69 mmHg (12/30 0500) SpO2:  [94 %-98 %] 96 % (12/30 0500) Weight change:  Last BM Date: 07/21/12  Intake/Output from previous day: 12/29 0701 - 12/30 0700 In: 680 [P.O.:680] Out: -   PHYSICAL EXAM General appearance: alert, cooperative, mild distress and Very hard of hearing Resp: clear to auscultation bilaterally Cardio: regular rate and rhythm, S1, S2 normal, no murmur, click, rub or gallop GI: soft, non-tender; bowel sounds normal; no masses,  no organomegaly Extremities: extremities normal, atraumatic, no cyanosis or edema  Lab Results:    Basic Metabolic Panel:  Basename 07/20/12 0607 07/19/12 0156  NA 135 131*  K 3.3* 4.0  CL 106 101  CO2 22 22  GLUCOSE 96 136*  BUN 22 31*  CREATININE 1.12* 1.12*  CALCIUM 7.7* 8.4  MG -- --  PHOS -- --   Liver Function Tests:  [facility] 07/19/12 0156  AST 17  ALT 10  ALKPHOS 65  BILITOT 0.5  PROT 5.7*  ALBUMIN 2.7*    Basename 07/19/12 0156  LIPASE 28  AMYLASE --   No results found for this basename: AMMONIA:2 in the last 72 hours CBC:  Basename 07/20/12 0607 07/19/12 0156  WBC 7.5 11.7*  NEUTROABS -- 9.4*  HGB 9.9* 10.5*  HCT 29.8* 31.1*  MCV 82.1 81.8  PLT 133* 142*   Cardiac Enzymes:  Basename 07/19/12 0156  CKTOTAL --  CKMB --  CKMBINDEX --  TROPONINI <0.30   BNP: No results found for this basename: PROBNP:3 in the last 72 hours D-Dimer: No results found for this basename: DDIMER:2 in the last 72 hours CBG: No results found for this basename: GLUCAP:6 in the last 72 hours Hemoglobin A1C: No results found for this basename: HGBA1C in the last 72 hours Fasting Lipid  Panel: No results found for this basename: CHOL,HDL,LDLCALC,TRIG,CHOLHDL,LDLDIRECT in the last 72 hours Thyroid Function Tests: No results found for this basename: TSH,T4TOTAL,FREET4,T3FREE,THYROIDAB in the last 72 hours Anemia Panel: No results found for this basename: VITAMINB12,FOLATE,FERRITIN,TIBC,IRON,RETICCTPCT in the last 72 hours Coagulation: No results found for this basename: LABPROT:2,INR:2 in the last 72 hours Urine Drug Screen: Drugs of Abuse  No results found for this basename: labopia, cocainscrnur, labbenz, amphetmu, thcu, labbarb    Alcohol Level: No results found for this basename: ETH:2 in the last 72 hours Urinalysis:  Basename 07/19/12 0258  COLORURINE YELLOW  LABSPEC 1.020  PHURINE 5.5  GLUCOSEU NEGATIVE  HGBUR LARGE*  BILIRUBINUR NEGATIVE  KETONESUR NEGATIVE  PROTEINUR NEGATIVE  UROBILINOGEN 0.2  NITRITE NEGATIVE  LEUKOCYTESUR SMALL*   Misc. Labs:  ABGS No results found for this basename: PHART,PCO2,PO2ART,TCO2,HCO3 in the last 72 hours CULTURES Recent Results (from the past 240 hour(s))  CULTURE, BLOOD (ROUTINE X 2)     Status: Normal (Preliminary result)   Collection Time   07/19/12  4:07 AM      Component Value Range Status Comment   Specimen Description BLOOD RIGHT HAND   Final    Special Requests BOTTLES DRAWN AEROBIC AND ANAEROBIC 10CC EACH   Final    Culture NO GROWTH 1 DAY   Final    Report Status PENDING   Incomplete  CULTURE, BLOOD (ROUTINE X 2)     Status: Normal (Preliminary result)   Collection Time   07/19/12  4:07 AM      Component Value Range Status Comment   Specimen Description BLOOD LEFT HAND   Final    Special Requests BOTTLES DRAWN AEROBIC ONLY 4CC   Final    Culture NO GROWTH 1 DAY   Final    Report Status PENDING   Incomplete   CLOSTRIDIUM DIFFICILE BY PCR     Status: Normal   Collection Time   07/19/12 12:52 PM      Component Value Range Status Comment   C difficile by pcr NEGATIVE  NEGATIVE Final     Studies/Results: Ct Abdomen Pelvis W Contrast  07/19/2012  *RADIOLOGY REPORT*  Clinical Data: Abdominal pain, diarrhea, and vomiting.  Colitis.  CT ABDOMEN AND PELVIS WITH CONTRAST  Technique:  Multidetector CT imaging of the abdomen and pelvis was performed following the standard protocol during bolus administration of intravenous contrast.  Contrast: OMNIPAQUE IOHEXOL 300 MG/ML  SOLN  Comparison: None  Findings: A large hiatal hernia is demonstrated.  There is moderate colonic wall thickening localized to the splenic flexure of colon and proximal descending colon, consistent with colitis.  This distribution raises suspicion for ischemic colitis, although the differential diagnosis also includes infectious colitis.  There is no evidence of pneumatosis, extraluminal air, or abscess.  A small amount of free fluid is seen in the right pelvis.  Prior hysterectomy noted.  The adnexal regions are otherwise unremarkable in appearance.  Mild bilateral renal parenchymal scarring is seen, without evidence of mass or hydronephrosis.  Atherosclerotic calcification of abdominal aorta is seen without evidence of aneurysm.  The liver, gallbladder, pancreas, and adrenal glands are normal in appearance. No soft tissue masses or lymphadenopathy identified.  IMPRESSION:  1.  Moderate colitis involving the splenic flexure.  This distribution raises suspicion for ischemic colitis, although the differential diagnosis also includes infectious etiologies. 2.  Small amount of free fluid in pelvis.  No evidence of abscess or other complication. 3.  Large hiatal hernia.  Critical Value/emergent results were called by telephone at the time of interpretation on 07/19/2012 at 1225 hours to the patient's floor nurse Darel Hong, who verbally acknowledged these results.   Original Report Authenticated By: Myles Rosenthal, M.D.     Medications:  Prior to Admission:  Prescriptions prior to admission  Medication Sig Dispense Refill  .  amLODipine (NORVASC) 5 MG tablet Take 5 mg by mouth daily after breakfast.       . aspirin 325 MG tablet Take 325 mg by mouth daily.      . lansoprazole (PREVACID) 30 MG capsule Take 30 mg by mouth daily after breakfast.      . levothyroxine (SYNTHROID, LEVOTHROID) 112 MCG tablet Take 112 mcg by mouth daily.      . metoprolol succinate (TOPROL-XL) 50 MG 24 hr tablet Take 50 mg by mouth daily after breakfast. Take with or immediately following a meal.      . quinapril (ACCUPRIL) 20 MG tablet Take 20 mg by mouth daily after breakfast.       . atorvastatin (LIPITOR) 10 MG tablet Take 10 mg by mouth every evening.        Scheduled:   . amLODipine  5 mg Oral Daily  . aspirin  325 mg Oral Daily  . atorvastatin  10 mg Oral QPM  . ciprofloxacin  400 mg Intravenous Q24H  . enoxaparin (LOVENOX) injection  30 mg Subcutaneous Q24H  . levothyroxine  75 mcg Oral QAC breakfast  . lisinopril  20 mg Oral Daily  . metoprolol succinate  50 mg Oral Daily  . metronidazole  500 mg Intravenous Q6H  . oseltamivir  75 mg Oral BID  . pantoprazole  40 mg Oral Daily  . polyethylene glycol  17 g Oral Daily  . potassium chloride  20 mEq Oral BID   Continuous:  NFA:[id number], guaiFENesin, ondansetron (ZOFRAN) IV, sodium phosphate, sorbitol  Assesment: She has colitis by CT. She was thought to have viral syndrome but I think it was more likely related to the colitis. He was concerned that she had community-acquired pneumonia but I do not believe she has that. She had dehydration which is better. Principal Problem:  *Viral syndrome Active Problems:  ESOPHAGEAL STRICTURE  GERD  DYSPHAGIA UNSPECIFIED  CAP (community acquired pneumonia), possibly  Dehydration  Diarrhea  Hyponatremia    Plan: GI consultation today    LOS: 3 days   Payson Crumby L 07/21/2012, 8:43 AM

## 2012-07-21 NOTE — Progress Notes (Signed)
UR Chart Review Completed  

## 2012-07-21 NOTE — Care Management Note (Signed)
    Page 1 of 1   07/21/2012     11:59:31 AM   CARE MANAGEMENT NOTE 07/21/2012  Patient:  Elizabeth [name], Elizabeth [name]   Account Number:  [id number]  Date Initiated:  07/21/2012  Documentation initiated by:  Sharrie Rothman  Subjective/Objective Assessment:   Pt admitted from home with colitis. Pt lives alone and has a niece that lives next door. Pt and her niece eat all meals together. Pt is independent with ADL's.     Action/Plan:   No CM or HH needs noted.   Anticipated DC Date:  07/24/2012   Anticipated DC Plan:  HOME/SELF CARE      DC Planning Services  CM consult      Choice offered to / List presented to:             Status of service:  Completed, signed off Medicare Important Message given?   (If response is "NO", the following Medicare IM given date fields will be blank) Date Medicare IM given:   Date Additional Medicare IM given:    Discharge Disposition:    Per UR Regulation:    If discussed at Long Length of Stay Meetings, dates discussed:    Comments:  07/21/12 1200 Arlyss Queen, RN BSN CM

## 2012-07-21 NOTE — Consult Note (Addendum)
Referring Provider: Dr Orvan Falconer ([facility]) Primary Care Physician:  Fredirick Maudlin, MD Primary Gastroenterologist:  Dr. Jena Gauss  Reason for Consultation:  Acute colitis  HPI: Elizabeth [name] is a 78 y.o. female admitted with malaise & diarrhea.  Pt states she was in her usual state of health until 2 days ago.  She developed severe lower abdominal cramps, followed by watery diarrhea TNTC.  She amits to recent antibiotics for UTI but cannot remember exactly when.  She denies N/V.  Denies fever, chills, rectal bleeding or melena.  She tells me she has not had a colonoscopy.  12/28 CT Chest without contrast incidentally noted splenic flexure colitis.  Moderate colitis of splenic flexure/proximal descending colon seen again on CT abd/pelvis with IV contrast, suspect ischemic vs. infectious.  2-view ABD was normal.  C diff PCR negative.  WBC 11.7 yesterday, now normal.  Albumin 2.7.  Hgb 11.3 one year ago, 10.5 two days ago, now 9.9.  Plts 133.  Legionella Ag pending.  Strep PNA urinary Ag negative.  Influenza panel PCR negative.  Preliminary Blood cx negative.  UA positive for SE, leukocytes, bacteria & hgb.   Lipase negative.  Troponin negative.  She has been started on Cipro 400mg  IV daily & Flagyl 500mg  IV q6h.  She has also had coughing-non-productive.  She has hx of esophageal dysphagia, motility disorder, Zenker's diverticulum, cervical web & Schatzki's ring last dilated by Dr Jena Gauss 03/12/12.  She is on prevacid 30mg  daily for this. Denies any heartburn, indigestion.  She has been tolerating a regular diet.  Past Medical History  Diagnosis Date  . Thyroid goiter     s/p surgery now on Synthroid  . B12 deficiency   . Coronary artery disease   . Zenker diverticulum   . Hyperlipidemia   . Hypertension   . Osteoporosis   . GERD (gastroesophageal reflux disease)   . Hypercholesterolemia   . Hypothyroidism     Past Surgical History  Procedure Date  . Esophagogastroduodenoscopy 6/10   Schatzki's ring, otherwise endoscopically normal esophagus, status post dilation (54F), with mucosal disruption through the cervical segment as well as the Schatzki's ring. Hiatal hernia. segment as well as the Schatzki's ring. Hiatal hernia.  . Coronary artery bypass graft     Status Post four-vessel   . Bunionectomy   . Appendectomy   . Abdominal hysterectomy   . Esophagogastroduodenoscopy 09/02/2007    Cervical esophageal web, Schatzki's ring, status post  dilation and disruption as described above. Otherwise, normal esophagus  . Esophagogastroduodenoscopy August 2011    Zenker's diverticulum, Schatzki's ring s/p dilation, mod to large HH, instructions to pursue BPE if recurrent dysphagia to assess Zenker's  . Esophageal dilation   . Esophagogastroduodenoscopy 03/12/12    Rourk-dilated 50F, cervical web, Schatzki's ring, small HH, small Zenker's diverticulum    Prior to Admission medications   Medication Sig Start Date End Date Taking? Authorizing Provider  amLODipine (NORVASC) 5 MG tablet Take 5 mg by mouth daily after breakfast.  12/10/11  Yes Historical Provider, MD  aspirin 325 MG tablet Take 325 mg by mouth daily.   Yes Historical Provider, MD  lansoprazole (PREVACID) 30 MG capsule Take 30 mg by mouth daily after breakfast.   Yes Historical Provider, MD  levothyroxine (SYNTHROID, LEVOTHROID) 112 MCG tablet Take 112 mcg by mouth daily.   Yes Historical Provider, MD  metoprolol succinate (TOPROL-XL) 50 MG 24 hr tablet Take 50 mg by mouth daily after breakfast. Take with or immediately following a  meal.   Yes Historical Provider, MD  quinapril (ACCUPRIL) 20 MG tablet Take 20 mg by mouth daily after breakfast.  12/10/11  Yes Historical Provider, MD  atorvastatin (LIPITOR) 10 MG tablet Take 10 mg by mouth every evening.  12/10/11   Historical Provider, MD    Current Facility-Administered Medications  Medication Dose Route Frequency Provider Last Rate Last Dose  . acetaminophen (TYLENOL)  tablet 650 mg  650 mg Oral Q6H PRN Vania Rea, MD      . amLODipine (NORVASC) tablet 5 mg  5 mg Oral Daily Vania Rea, MD   5 mg at 07/20/12 1000  . aspirin tablet 325 mg  325 mg Oral Daily Vania Rea, MD   325 mg at 07/20/12 1000  . atorvastatin (LIPITOR) tablet 10 mg  10 mg Oral QPM Vania Rea, MD   10 mg at 07/20/12 1700  . ciprofloxacin (CIPRO) IVPB 400 mg  400 mg Intravenous Q24H Fredirick Maudlin, MD   400 mg at 07/21/12 0804  . enoxaparin (LOVENOX) injection 30 mg  30 mg Subcutaneous Q24H Vania Rea, MD   30 mg at 07/21/12 0804  . guaiFENesin (ROBITUSSIN) 100 MG/5ML solution 200 mg  200 mg Oral Q4H PRN Fredirick Maudlin, MD      . levothyroxine (SYNTHROID, LEVOTHROID) tablet 75 mcg  75 mcg Oral QAC breakfast Vania Rea, MD   75 mcg at 07/21/12 0804  . lisinopril (PRINIVIL,ZESTRIL) tablet 20 mg  20 mg Oral Daily Vania Rea, MD   20 mg at 07/20/12 1000  . metoprolol succinate (TOPROL-XL) 24 hr tablet 50 mg  50 mg Oral Daily Vania Rea, MD   50 mg at 07/20/12 1000  . metroNIDAZOLE (FLAGYL) IVPB 500 mg  500 mg Intravenous Q6H Fredirick Maudlin, MD   500 mg at 07/21/12 0502  . ondansetron (ZOFRAN) injection 4 mg  4 mg Intravenous Q6H PRN Vania Rea, MD      . oseltamivir (TAMIFLU) capsule 75 mg  75 mg Oral BID Vania Rea, MD   75 mg at 07/20/12 2311  . pantoprazole (PROTONIX) EC tablet 40 mg  40 mg Oral Daily Vania Rea, MD   40 mg at 07/20/12 1000  . polyethylene glycol (MIRALAX / GLYCOLAX) packet 17 g  17 g Oral Daily Vania Rea, MD   17 g at 07/20/12 1000  . potassium chloride SA (K-DUR,KLOR-CON) CR tablet 20 mEq  20 mEq Oral BID Fredirick Maudlin, MD   20 mEq at 07/20/12 2310  . sodium phosphate (FLEET) 7-19 GM/118ML enema 1 enema  1 enema Rectal Daily PRN Vania Rea, MD      . sorbitol 70 % solution 30 mL  30 mL Oral Daily PRN Vania Rea, MD        Allergies as of 07/18/2012  . (No Known Allergies)     Family History:There is no known family history of colorectal carcinoma , liver disease, or inflammatory bowel disease.   Problem Relation Age of Onset  . Colon cancer Neg Hx     History   Social History  . Marital Status: Widowed    Spouse Name: N/A    Number of Children: N/A  . Years of Education: N/A   Occupational History  . Not on file.   Social History Main Topics  . Smoking status: Never Smoker   . Smokeless tobacco: Not on file  . Alcohol Use: No  . Drug Use: No  . Sexually Active: Not Currently   Review of  Systems: Gen: see HPI CV: Denies chest pain, angina, palpitations, syncope, orthopnea, PND, peripheral edema, and claudication. Resp: See HPI GI: Denies vomiting blood, jaundice GU : Denies urinary burning, blood in urine, urinary frequency, urinary hesitancy, nocturnal urination, and urinary incontinence. MS: Denies joint pain, limitation of movement, and swelling, stiffness, low back pain, extremity pain. Denies muscle weakness, cramps, atrophy.  Derm: Denies rash, itching, dry skin, hives, moles, warts, or unhealing ulcers.  Psych: Denies depression, anxiety, memory loss, suicidal ideation, hallucinations, paranoia, and confusion. Heme: Denies bruising, bleeding, and enlarged lymph nodes. Neuro:  Denies any headaches, dizziness, paresthesias. Endo:  Denies any problems with DM, thyroid, adrenal function.  Physical Exam: Vital signs in last 24 hours: Temp:  [97.5 F (36.4 C)-98 F (36.7 C)] 98 F (36.7 C) (12/30 0500) Pulse Rate:  [67-106] 73  (12/30 0500) Resp:  [16-20] 16  (12/30 0500) BP: (94-116)/(47-69) 116/69 mmHg (12/30 0500) SpO2:  [94 %-98 %] 96 % (12/30 0500) Last BM Date: 07/21/12 No LMP recorded. Patient has had a hysterectomy. General:   Alert,  Well-developed, well-nourished, pleasant and cooperative elderly white female in NAD Head:  Normocephalic and atraumatic. Eyes:  Sclera clear, no icterus.   Conjunctiva pink. Ears:   +significantly HOH Nose:  No deformity, discharge, or lesions. Mouth:  No deformity or lesions,oropharynx pink & moist. Neck:  Supple; no masses or thyromegaly. Lungs:  Clear throughout to auscultation.   No wheezes, crackles, or rhonchi. No acute distress. Heart:  Regular rate and rhythm; no murmurs, clicks, rubs,  or gallops. Abdomen:  Normal bowel sounds.  No bruits.  Soft, non-tender and non-distended without masses, hepatosplenomegaly or hernias noted.  No guarding or rebound tenderness.   Rectal:  Deferred. Msk:  Symmetrical without gross deformities.  Pulses:  Normal pulses noted. Extremities:  No edema. Neurologic:  Alert and oriented x4;  grossly normal neurologically. Skin:  Intact without significant lesions or rashes. Lymph Nodes:  No significant cervical adenopathy. Psych:  Alert and cooperative. Normal mood and affect.  Intake/Output from previous day: 12/29 0701 - 12/30 0700 In: 680 [P.O.:680] Out: -  Intake/Output this shift:    Lab Results:  Basename 07/20/12 0607 07/19/12 0156  WBC 7.5 11.7*  HGB 9.9* 10.5*  HCT 29.8* 31.1*  PLT 133* 142*   BMET  Basename 07/20/12 0607 07/19/12 0156  NA 135 131*  K 3.3* 4.0  CL 106 101  CO2 22 22  GLUCOSE 96 136*  BUN 22 31*  CREATININE 1.12* 1.12*  CALCIUM 7.7* 8.4   LFT  Basename 07/19/12 0156  PROT 5.7*  ALBUMIN 2.7*  AST 17  ALT 10  ALKPHOS 65  BILITOT 0.5  BILIDIR --  IBILI --  LIPASE 28  AMYLASE --   Studies/Results: Ct Abdomen Pelvis W Contrast  07/19/2012  *RADIOLOGY REPORT*  Clinical Data: Abdominal pain, diarrhea, and vomiting.  Colitis.  CT ABDOMEN AND PELVIS WITH CONTRAST  Technique:  Multidetector CT imaging of the abdomen and pelvis was performed following the standard protocol during bolus administration of intravenous contrast.  Contrast: OMNIPAQUE IOHEXOL 300 MG/ML  SOLN  Comparison: None  Findings: A large hiatal hernia is demonstrated.  There is moderate colonic wall thickening  localized to the splenic flexure of colon and proximal descending colon, consistent with colitis.  This distribution raises suspicion for ischemic colitis, although the differential diagnosis also includes infectious colitis.  There is no evidence of pneumatosis, extraluminal air, or abscess.  A small amount of free fluid is  seen in the right pelvis.  Prior hysterectomy noted.  The adnexal regions are otherwise unremarkable in appearance.  Mild bilateral renal parenchymal scarring is seen, without evidence of mass or hydronephrosis.  Atherosclerotic calcification of abdominal aorta is seen without evidence of aneurysm.  The liver, gallbladder, pancreas, and adrenal glands are normal in appearance. No soft tissue masses or lymphadenopathy identified.  IMPRESSION:  1.  Moderate colitis involving the splenic flexure.  This distribution raises suspicion for ischemic colitis, although the differential diagnosis also includes infectious etiologies. 2.  Small amount of free fluid in pelvis.  No evidence of abscess or other complication. 3.  Large hiatal hernia.  Critical Value/emergent results were called by telephone at the time of interpretation on 07/19/2012 at 1225 hours to the patient's floor nurse Darel Hong, who verbally acknowledged these results.   Original Report Authenticated By: Myles Rosenthal, M.D.     Impression: CIANNA KASPARIAN is a pleasant 78 y.o. female admitted with acute colitis.  She has been on recent antibiotics, however C Diff PCR negative.  Although, she had no hematochezia, CT suggests ischemic colitis of splenic flexure/proximal descending colon.  Cannot r/o infection.  She has been treated with empiric cipro/flagyl which is very appropriate.   Plan: 1. Complete course cipro & flagyl.  May change to PO once able to tolerate. 2. Continue supportive measures including IVFs, rest 3. Continue daily PPI 4. Will continue to follow with you   LOS: 3 days   Elizabeth [name]  07/21/2012, 8:46  AM [facility]  Patient feeling better this afternoon. No rectal bleeding thus far. Agree with above plan as outlined.

## 2012-07-22 ENCOUNTER — Telehealth: Payer: Self-pay | Admitting: Urgent Care

## 2012-07-22 ENCOUNTER — Encounter: Payer: Self-pay | Admitting: Urgent Care

## 2012-07-22 MED ORDER — SODIUM CHLORIDE 0.9 % IJ SOLN
3.0000 mL | Freq: Two times a day (BID) | INTRAMUSCULAR | Status: DC
  Administered 2012-07-22 (×2): 3 mL via INTRAVENOUS

## 2012-07-22 MED ORDER — METRONIDAZOLE 500 MG PO TABS
500.0000 mg | ORAL_TABLET | Freq: Three times a day (TID) | ORAL | Status: DC
  Administered 2012-07-22 – 2012-07-23 (×4): 500 mg via ORAL
  Filled 2012-07-22 (×4): qty 1

## 2012-07-22 MED ORDER — SODIUM CHLORIDE 0.9 % IJ SOLN: 3.0000 mL | INTRAMUSCULAR | Status: DC | PRN

## 2012-07-22 MED ORDER — SODIUM CHLORIDE 0.9 % IV SOLN: 250.0000 mL | INTRAVENOUS | Status: DC | PRN

## 2012-07-22 MED ORDER — CIPROFLOXACIN HCL 250 MG PO TABS
500.0000 mg | ORAL_TABLET | Freq: Two times a day (BID) | ORAL | Status: DC
  Administered 2012-07-22 – 2012-07-23 (×2): 500 mg via ORAL
  Filled 2012-07-22 (×2): qty 1
  Filled 2012-07-22: qty 2

## 2012-07-22 NOTE — Progress Notes (Addendum)
Subjective: Pt denies any abdominal pain, diarrhea, nausea or vomiting.  Eating eggs, bacon, & toast for breakfast this morning.  Objective: Vital signs in last 24 hours: Temp:  [97.9 F (36.6 C)-98.3 F (36.8 C)] 98.2 F (36.8 C) (12/31 0505) Pulse Rate:  [63-81] 77  (12/31 0505) Resp:  [16-19] 19  (12/31 0505) BP: (101-123)/(53-72) 117/61 mmHg (12/31 0505) SpO2:  [95 %-100 %] 96 % (12/31 0505) Weight:  [121 lb 7.6 oz (55.1 kg)] 121 lb 7.6 oz (55.1 kg) (12/31 0505) Last BM Date: 07/21/12 No LMP recorded. Patient has had a hysterectomy. Body mass index is 20.21 kg/(m^2). General:   Alert,  pleasant and cooperative in NAD Eyes:  Sclera clear, no icterus.   Conjunctiva pink. Mouth:  No deformity or lesions, oropharynx pink & moist. Heart:  Regular rate and rhythm Abdomen:   Normal bowel sounds.  Soft, nontender and nondistended.  No guarding or rebound tenderness.   Msk:  Symmetrical without gross deformities. Pulses:  Normal pulses noted. Extremities:  Without clubbing or edema. Neurologic:  Alert and  oriented x4;  grossly normal neurologically. Skin:  Intact without significant lesions or rashes. Psych:  Alert and cooperative. Normal mood and affect.  Intake/Output from previous day: 12/30 0701 - 12/31 0700 In: 1120 [P.O.:720; IV Piggyback:400] Out: -   Lab Results:  [facility] 07/20/12 0607  WBC 7.5  HGB 9.9*  HCT 29.8*  PLT 133*   BMET  Basename 07/20/12 0607  NA 135  K 3.3*  CL 106  CO2 22  GLUCOSE 96  BUN 22  CREATININE 1.12*  CALCIUM 7.7*   Assessment: 1. Acute colitis:  Diarrhea has resolved.  2. Mild hypokalemia: Per attending  Plan: 1. Complete cipro/flagyl 2. Anticipate DC home soon 3. Outpatient Follow up with [place] in 4-6 weeks  LOS: 4 days   Lorenza Burton  07/22/2012, 7:47 AM   Patient doing well this afternoon. Tolerated a hamburger for lunch. Anticipate discharge within the next 24 hours per Dr. Juanetta Gosling -   followup arranged through our  office.

## 2012-07-22 NOTE — Telephone Encounter (Signed)
Pt inpt at [facility].  Will need 4-6 weeks w/ me FU re: colitis, make decisions regarding colonoscopy Thanks

## 2012-07-22 NOTE — Progress Notes (Signed)
Subjective: She feels better. She has no new complaints. She is still having some mild abdominal discomfort. Her potassium level is still slightly low  Objective: Vital signs in last 24 hours: Temp:  [97.9 F (36.6 C)-98.3 F (36.8 C)] 98.2 F (36.8 C) (12/31 0505) Pulse Rate:  [63-81] 77  (12/31 0505) Resp:  [16-19] 19  (12/31 0505) BP: (101-123)/(53-72) 117/61 mmHg (12/31 0505) SpO2:  [95 %-100 %] 96 % (12/31 0505) Weight:  [55.1 kg (121 lb 7.6 oz)] 55.1 kg (121 lb 7.6 oz) (12/31 0505) Weight change:  Last BM Date: 07/21/12  Intake/Output from previous day: 12/30 0701 - 12/31 0700 In: 1120 [P.O.:720; IV Piggyback:400] Out: -   PHYSICAL EXAM General appearance: alert, cooperative, mild distress and Very hard of hearing Resp: clear to auscultation bilaterally Cardio: regular rate and rhythm, S1, S2 normal, no murmur, click, rub or gallop GI: Mildly diffusely tender Extremities: extremities normal, atraumatic, no cyanosis or edema  Lab Results:    Basic Metabolic Panel:  Basename 07/20/12 0607  NA 135  K 3.3*  CL 106  CO2 22  GLUCOSE 96  BUN 22  CREATININE 1.12*  CALCIUM 7.7*  MG --  PHOS --   Liver Function Tests: No results found for this basename: AST:2,ALT:2,ALKPHOS:2,BILITOT:2,PROT:2,ALBUMIN:2 in the last 72 hours No results found for this basename: LIPASE:2,AMYLASE:2 in the last 72 hours No results found for this basename: AMMONIA:2 in the last 72 hours CBC:  Basename 07/20/12 0607  WBC 7.5  NEUTROABS --  HGB 9.9*  HCT 29.8*  MCV 82.1  PLT 133*   Cardiac Enzymes: No results found for this basename: CKTOTAL:3,CKMB:3,CKMBINDEX:3,TROPONINI:3 in the last 72 hours BNP: No results found for this basename: PROBNP:3 in the last 72 hours D-Dimer: No results found for this basename: DDIMER:2 in the last 72 hours CBG: No results found for this basename: GLUCAP:6 in the last 72 hours Hemoglobin A1C: No results found for this basename: HGBA1C in the last  72 hours Fasting Lipid Panel: No results found for this basename: CHOL,HDL,LDLCALC,TRIG,CHOLHDL,LDLDIRECT in the last 72 hours Thyroid Function Tests: No results found for this basename: TSH,T4TOTAL,FREET4,T3FREE,THYROIDAB in the last 72 hours Anemia Panel: No results found for this basename: VITAMINB12,FOLATE,FERRITIN,TIBC,IRON,RETICCTPCT in the last 72 hours Coagulation: No results found for this basename: LABPROT:2,INR:2 in the last 72 hours Urine Drug Screen: Drugs of Abuse  No results found for this basename: labopia, cocainscrnur, labbenz, amphetmu, thcu, labbarb    Alcohol Level: No results found for this basename: ETH:2 in the last 72 hours Urinalysis: No results found for this basename: COLORURINE:2,APPERANCEUR:2,LABSPEC:2,PHURINE:2,GLUCOSEU:2,HGBUR:2,BILIRUBINUR:2,KETONESUR:2,PROTEINUR:2,UROBILINOGEN:2,NITRITE:2,LEUKOCYTESUR:2 in the last 72 hours Misc. Labs:  ABGS No results found for this basename: PHART,PCO2,PO2ART,TCO2,HCO3 in the last 72 hours CULTURES Recent Results (from the past 240 hour(s))  CULTURE, BLOOD (ROUTINE X 2)     Status: Normal (Preliminary result)   Collection Time   07/19/12  4:07 AM      Component Value Range Status Comment   Specimen Description BLOOD RIGHT HAND   Final    Special Requests BOTTLES DRAWN AEROBIC AND ANAEROBIC 10CC EACH   Final    Culture NO GROWTH 2 DAYS   Final    Report Status PENDING   Incomplete   CULTURE, BLOOD (ROUTINE X 2)     Status: Normal (Preliminary result)   Collection Time   07/19/12  4:07 AM      Component Value Range Status Comment   Specimen Description BLOOD LEFT HAND   Final    Special Requests BOTTLES  DRAWN AEROBIC ONLY 4CC   Final    Culture NO GROWTH 2 DAYS   Final    Report Status PENDING   Incomplete   CLOSTRIDIUM DIFFICILE BY PCR     Status: Normal   Collection Time   07/19/12 12:52 PM      Component Value Range Status Comment   C difficile by pcr NEGATIVE  NEGATIVE Final    Studies/Results: No  results found.  Medications:  Prior to Admission:  Prescriptions prior to admission  Medication Sig Dispense Refill  . amLODipine (NORVASC) 5 MG tablet Take 5 mg by mouth daily after breakfast.       . aspirin 325 MG tablet Take 325 mg by mouth daily.      . lansoprazole (PREVACID) 30 MG capsule Take 30 mg by mouth daily after breakfast.      . levothyroxine (SYNTHROID, LEVOTHROID) 112 MCG tablet Take 112 mcg by mouth daily.      . metoprolol succinate (TOPROL-XL) 50 MG 24 hr tablet Take 50 mg by mouth daily after breakfast. Take with or immediately following a meal.      . quinapril (ACCUPRIL) 20 MG tablet Take 20 mg by mouth daily after breakfast.       . atorvastatin (LIPITOR) 10 MG tablet Take 10 mg by mouth every evening.        Scheduled:   . amLODipine  5 mg Oral Daily  . aspirin  325 mg Oral Daily  . atorvastatin  10 mg Oral QPM  . ciprofloxacin  500 mg Oral BID  . enoxaparin (LOVENOX) injection  30 mg Subcutaneous Q24H  . levothyroxine  75 mcg Oral QAC breakfast  . lisinopril  20 mg Oral Daily  . metoprolol succinate  50 mg Oral Daily  . metroNIDAZOLE  500 mg Oral Q8H  . pantoprazole  40 mg Oral Daily  . polyethylene glycol  17 g Oral Daily  . potassium chloride  20 mEq Oral BID   Continuous:  JYN:[id number], guaiFENesin, ondansetron (ZOFRAN) IV, sodium phosphate, sorbitol  Assesment: She has colitis which is improving. She has had diarrhea but that seems to stop. Her dehydration is better. She still mildly hypokalemic. Principal Problem:  *Colitis Active Problems:  ESOPHAGEAL STRICTURE  GERD  DYSPHAGIA UNSPECIFIED  Viral syndrome  CAP (community acquired pneumonia), possibly  Dehydration  Diarrhea  Hyponatremia    Plan: Switch her to by mouth antibiotics and assuming she does well with that she'll be able to go home tomorrow    LOS: 4 days   Malene Blaydes L 07/22/2012, 8:56 AM

## 2012-07-22 NOTE — Telephone Encounter (Signed)
Pt is aware of OV on 2/11 at 130 with KJ and appt card was mailed

## 2012-07-23 MED ORDER — CIPROFLOXACIN HCL 500 MG PO TABS: 500.0000 mg | ORAL_TABLET | Freq: Two times a day (BID) | ORAL | Status: DC

## 2012-07-23 MED ORDER — METRONIDAZOLE 500 MG PO TABS: 500.0000 mg | ORAL_TABLET | Freq: Three times a day (TID) | ORAL | Status: DC

## 2012-07-23 NOTE — Plan of Care (Signed)
Problem: Discharge Progression Outcomes Goal: Pain controlled with appropriate interventions Outcome: Not Applicable Date Met:  07/23/12 Pt denies pain Goal: Other Discharge Outcomes/Goals Outcome: Completed/Met Date Met:  07/23/12 Discharged to home with family

## 2012-07-23 NOTE — Progress Notes (Signed)
Subjective: She's doing well. She has no complaints.  Objective: Vital signs in last 24 hours: Temp:  [97.7 F (36.5 C)-98.5 F (36.9 C)] 97.8 F (36.6 C) (01/01 0659) Pulse Rate:  [67-70] 70  (01/01 0659) Resp:  [18-20] 20  (01/01 0659) BP: (127-155)/(61-76) 148/76 mmHg (01/01 0659) SpO2:  [95 %-100 %] 98 % (01/01 0659) Weight:  [53.524 kg (118 lb)] 53.524 kg (118 lb) (01/01 0500) Weight change: -1.576 kg (-3 lb 7.6 oz) Last BM Date: 07/22/12  Intake/Output from previous day: 12/31 0701 - 01/01 0700 In: 1083 [P.O.:1080; I.V.:3] Out: -   PHYSICAL EXAM General appearance: alert, cooperative and no distress Resp: clear to auscultation bilaterally Cardio: regular rate and rhythm, S1, S2 normal, no murmur, click, rub or gallop GI: soft, non-tender; bowel sounds normal; no masses,  no organomegaly Extremities: extremities normal, atraumatic, no cyanosis or edema  Lab Results:    Basic Metabolic Panel: No results found for this basename: NA:2,K:2,CL:2,CO2:2,GLUCOSE:2,BUN:2,CREATININE:2,CALCIUM:2,MG:2,PHOS:2 in the last 72 hours Liver Function Tests: No results found for this basename: AST:2,ALT:2,ALKPHOS:2,BILITOT:2,PROT:2,ALBUMIN:2 in the last 72 hours No results found for this basename: LIPASE:2,AMYLASE:2 in the last 72 hours No results found for this basename: AMMONIA:2 in the last 72 hours CBC: No results found for this basename: WBC:2,NEUTROABS:2,HGB:2,HCT:2,MCV:2,PLT:2 in the last 72 hours Cardiac Enzymes: No results found for this basename: CKTOTAL:3,CKMB:3,CKMBINDEX:3,TROPONINI:3 in the last 72 hours BNP: No results found for this basename: PROBNP:3 in the last 72 hours D-Dimer: No results found for this basename: DDIMER:2 in the last 72 hours CBG: No results found for this basename: GLUCAP:6 in the last 72 hours Hemoglobin A1C: No results found for this basename: HGBA1C in the last 72 hours Fasting Lipid Panel: No results found for this basename:  CHOL,HDL,LDLCALC,TRIG,CHOLHDL,LDLDIRECT in the last 72 hours Thyroid Function Tests: No results found for this basename: TSH,T4TOTAL,FREET4,T3FREE,THYROIDAB in the last 72 hours Anemia Panel: No results found for this basename: VITAMINB12,FOLATE,FERRITIN,TIBC,IRON,RETICCTPCT in the last 72 hours Coagulation: No results found for this basename: LABPROT:2,INR:2 in the last 72 hours Urine Drug Screen: Drugs of Abuse  No results found for this basename: labopia, cocainscrnur, labbenz, amphetmu, thcu, labbarb    Alcohol Level: No results found for this basename: ETH:2 in the last 72 hours Urinalysis: No results found for this basename: COLORURINE:2,APPERANCEUR:2,LABSPEC:2,PHURINE:2,GLUCOSEU:2,HGBUR:2,BILIRUBINUR:2,KETONESUR:2,PROTEINUR:2,UROBILINOGEN:2,NITRITE:2,LEUKOCYTESUR:2 in the last 72 hours Misc. Labs:  ABGS No results found for this basename: PHART,PCO2,PO2ART,TCO2,HCO3 in the last 72 hours CULTURES Recent Results (from the past 240 hour(s))  CULTURE, BLOOD (ROUTINE X 2)     Status: Normal (Preliminary result)   Collection Time   07/19/12  4:07 AM      Component Value Range Status Comment   Specimen Description BLOOD RIGHT HAND   Final    Special Requests BOTTLES DRAWN AEROBIC AND ANAEROBIC 10CC EACH   Final    Culture NO GROWTH 3 DAYS   Final    Report Status PENDING   Incomplete   CULTURE, BLOOD (ROUTINE X 2)     Status: Normal (Preliminary result)   Collection Time   07/19/12  4:07 AM      Component Value Range Status Comment   Specimen Description BLOOD LEFT HAND   Final    Special Requests BOTTLES DRAWN AEROBIC ONLY 4CC   Final    Culture NO GROWTH 3 DAYS   Final    Report Status PENDING   Incomplete   CLOSTRIDIUM DIFFICILE BY PCR     Status: Normal   Collection Time   07/19/12 12:52 PM  Component Value Range Status Comment   C difficile by pcr NEGATIVE  NEGATIVE Final    Studies/Results: No results found.  Medications:  Prior to Admission:  Prescriptions  prior to admission  Medication Sig Dispense Refill  . amLODipine (NORVASC) 5 MG tablet Take 5 mg by mouth daily after breakfast.       . aspirin 325 MG tablet Take 325 mg by mouth daily.      . lansoprazole (PREVACID) 30 MG capsule Take 30 mg by mouth daily after breakfast.      . levothyroxine (SYNTHROID, LEVOTHROID) 112 MCG tablet Take 112 mcg by mouth daily.      . metoprolol succinate (TOPROL-XL) 50 MG 24 hr tablet Take 50 mg by mouth daily after breakfast. Take with or immediately following a meal.      . quinapril (ACCUPRIL) 20 MG tablet Take 20 mg by mouth daily after breakfast.       . atorvastatin (LIPITOR) 10 MG tablet Take 10 mg by mouth every evening.        Scheduled:   . amLODipine  5 mg Oral Daily  . aspirin  325 mg Oral Daily  . atorvastatin  10 mg Oral QPM  . ciprofloxacin  500 mg Oral BID  . enoxaparin (LOVENOX) injection  30 mg Subcutaneous Q24H  . levothyroxine  75 mcg Oral QAC breakfast  . lisinopril  20 mg Oral Daily  . metoprolol succinate  50 mg Oral Daily  . metroNIDAZOLE  500 mg Oral Q8H  . pantoprazole  40 mg Oral Daily  . polyethylene glycol  17 g Oral Daily  . potassium chloride  20 mEq Oral BID  . sodium chloride  3 mL Intravenous Q12H   Continuous:  ZOX:WRUEAV chloride, acetaminophen, guaiFENesin, ondansetron (ZOFRAN) IV, sodium chloride, sodium phosphate, sorbitol  Assesment: She was admitted with colitis. She feels much better. She has tolerated oral medications. Principal Problem:  *Colitis Active Problems:  ESOPHAGEAL STRICTURE  GERD  DYSPHAGIA UNSPECIFIED  Viral syndrome  CAP (community acquired pneumonia), possibly  Dehydration  Diarrhea  Hyponatremia    Plan: Discharge home today    LOS: 5 days   Elizabeth [name] 07/23/2012, 11:12 AM

## 2012-07-24 LAB — CULTURE, BLOOD (ROUTINE X 2): Culture: NO GROWTH

## 2012-07-25 NOTE — Discharge Summary (Signed)
Physician Discharge Summary  Patient ID: Elizabeth [name] MRN: [id number] DOB/AGE: 1918/08/13 79 y.o. Primary Care Physician:Alika Saladin L, MD Admit date: 07/18/2012 Discharge date: 07/25/2012    Discharge Diagnoses:   Principal Problem:  *Colitis Active Problems:  ESOPHAGEAL STRICTURE  GERD  DYSPHAGIA UNSPECIFIED  Viral syndrome  CAP (community acquired pneumonia), possibly  Dehydration  Diarrhea  Hyponatremia     Medication List     As of 07/25/2012  8:53 PM    TAKE these medications         amLODipine 5 MG tablet   Commonly known as: NORVASC   Take 5 mg by mouth daily after breakfast.      aspirin 325 MG tablet   Take 325 mg by mouth daily.      atorvastatin 10 MG tablet   Commonly known as: LIPITOR   Take 10 mg by mouth every evening.      ciprofloxacin 500 MG tablet   Commonly known as: CIPRO   Take 1 tablet (500 mg total) by mouth 2 (two) times daily.      lansoprazole 30 MG capsule   Commonly known as: PREVACID   Take 30 mg by mouth daily after breakfast.      levothyroxine 112 MCG tablet   Commonly known as: SYNTHROID, LEVOTHROID   Take 112 mcg by mouth daily.      metoprolol succinate 50 MG 24 hr tablet   Commonly known as: TOPROL-XL   Take 50 mg by mouth daily after breakfast. Take with or immediately following a meal.      metroNIDAZOLE 500 MG tablet   Commonly known as: FLAGYL   Take 1 tablet (500 mg total) by mouth every 8 (eight) hours.      quinapril 20 MG tablet   Commonly known as: ACCUPRIL   Take 20 mg by mouth daily after breakfast.        Discharged Condition: Improved    Consults: Gastroenterology  Significant Diagnostic Studies: Dg Chest 2 View  07/19/2012  *RADIOLOGY REPORT*  Clinical Data: Chills, diarrhea, vomiting and cough.  Lower abdominal pain.  CHEST - 2 VIEW  Comparison: Chest radiograph performed 01/11/2011  Findings: The lungs are well-aerated.  Mild bibasilar airspace opacities, left greater than right, may  reflect atelectasis or possibly pneumonia.  No definite pleural effusion or pneumothorax is seen.  The heart is borderline normal in size.  The patient is status post median sternotomy, with evidence of prior CABG.  Calcification is noted within the aortic arch.  No acute osseous abnormalities are seen.  IMPRESSION: Mild bibasilar airspace opacities, left greater than right, may reflect atelectasis or possibly pneumonia.   Original Report Authenticated By: Tonia Ghent, M.D.    Ct Chest Wo Contrast  07/19/2012  *RADIOLOGY REPORT*  Clinical Data: Cough, chills, diarrhea and vomiting.  Abdominal pain.  CT CHEST WITHOUT CONTRAST  Technique:  Multidetector CT imaging of the chest was performed following the standard protocol without IV contrast.  Comparison: Chest radiograph performed earlier today at 02:37 a.m.  Findings: Mild atelectasis is noted at the lung bases.  There is mild diffuse prominence of the vasculature.  The lungs are otherwise clear.  No focal airspace consolidation, pleural effusion or pneumothorax is seen.  Scattered calcification is noted at the mitral and aortic valves. The mediastinum is otherwise unremarkable in appearance.  A borderline prominent precarinal node is seen, measuring 1.1 cm in short axis; this has a normal fatty hilum, and is likely within normal limits.  No pericardial effusion is identified.  The great vessels are grossly unremarkable in appearance.  The patient is status post median sternotomy.  A moderate hiatal hernia is seen.  The visualized portions of the liver and spleen are unremarkable.  Scattered calcifications noted along the descending thoracic and proximal abdominal aorta.  There is soft tissue inflammation tracking about the visualized portions of the splenic flexure of the colon; would suggest clinical correlation for signs of colitis.  No acute osseous abnormalities are identified.  Mild degenerative change is noted at the lower cervical spine.  IMPRESSION:   1.  Soft tissue inflammation noted tracking about the visualized portions of the splenic flexure of the colon.  Suggest clinical correlation for evidence of colitis. 2.  Mild atelectasis at the lung bases; lungs otherwise clear. 3.  Mild vascular congestion suggested. 4.  Borderline prominent precarinal node, measuring 1.1 cm in short axis; given its normal fatty hilum, this is likely within normal limits. 5.  Moderate hiatal hernia noted. 6.  Scattered vascular calcifications seen.   Original Report Authenticated By: Tonia Ghent, M.D.    Ct Abdomen Pelvis W Contrast  07/19/2012  *RADIOLOGY REPORT*  Clinical Data: Abdominal pain, diarrhea, and vomiting.  Colitis.  CT ABDOMEN AND PELVIS WITH CONTRAST  Technique:  Multidetector CT imaging of the abdomen and pelvis was performed following the standard protocol during bolus administration of intravenous contrast.  Contrast: OMNIPAQUE IOHEXOL 300 MG/ML  SOLN  Comparison: None  Findings: A large hiatal hernia is demonstrated.  There is moderate colonic wall thickening localized to the splenic flexure of colon and proximal descending colon, consistent with colitis.  This distribution raises suspicion for ischemic colitis, although the differential diagnosis also includes infectious colitis.  There is no evidence of pneumatosis, extraluminal air, or abscess.  A small amount of free fluid is seen in the right pelvis.  Prior hysterectomy noted.  The adnexal regions are otherwise unremarkable in appearance.  Mild bilateral renal parenchymal scarring is seen, without evidence of mass or hydronephrosis.  Atherosclerotic calcification of abdominal aorta is seen without evidence of aneurysm.  The liver, gallbladder, pancreas, and adrenal glands are normal in appearance. No soft tissue masses or lymphadenopathy identified.  IMPRESSION:  1.  Moderate colitis involving the splenic flexure.  This distribution raises suspicion for ischemic colitis, although the differential  diagnosis also includes infectious etiologies. 2.  Small amount of free fluid in pelvis.  No evidence of abscess or other complication. 3.  Large hiatal hernia.  Critical Value/emergent results were called by telephone at the time of interpretation on 07/19/2012 at 1225 hours to the patient's floor nurse Darel Hong, who verbally acknowledged these results.   Original Report Authenticated By: Myles Rosenthal, M.D.    Dg Abd 2 Views  07/19/2012  *RADIOLOGY REPORT*  Clinical Data: Lower abdominal pain.  ABDOMEN - 2 VIEW  Comparison: Abdominal radiograph performed 03/03/2005  Findings: The visualized bowel gas pattern is unremarkable. Scattered air and stool filled loops of colon are seen; no abnormal dilatation of small bowel loops is seen to suggest small bowel obstruction.  No free intra-abdominal air is identified, though evaluation for free air is limited on a single supine view.  Degenerative change is noted at the lower lumbar spine, and mild sclerotic change is seen at the sacroiliac joints.  Airspace opacities noted on the chest radiograph are not readily characterized on the current study.  The patient is status post median sternotomy.  IMPRESSION:  1.  Unremarkable bowel gas  pattern; no free intra-abdominal air seen. 2.  Degenerative change at the lower lumbar spine.   Original Report Authenticated By: Tonia Ghent, M.D.     Lab Results: Basic Metabolic Panel: No results found for this basename: NA:2,K:2,CL:2,CO2:2,GLUCOSE:2,BUN:2,CREATININE:2,CALCIUM:2,MG:2,PHOS:2 in the last 72 hours Liver Function Tests: No results found for this basename: AST:2,ALT:2,ALKPHOS:2,BILITOT:2,PROT:2,ALBUMIN:2 in the last 72 hours   CBC: No results found for this basename: WBC:2,NEUTROABS:2,HGB:2,HCT:2,MCV:2,PLT:2 in the last 72 hours  Recent Results (from the past 240 hour(s))  CULTURE, BLOOD (ROUTINE X 2)     Status: Normal   Collection Time   07/19/12  4:07 AM      Component Value Range Status Comment   Specimen  Description BLOOD RIGHT HAND   Final    Special Requests BOTTLES DRAWN AEROBIC AND ANAEROBIC 10CC EACH   Final    Culture NO GROWTH 5 DAYS   Final    Report Status 07/24/2012 FINAL   Final   CULTURE, BLOOD (ROUTINE X 2)     Status: Normal   Collection Time   07/19/12  4:07 AM      Component Value Range Status Comment   Specimen Description BLOOD LEFT HAND   Final    Special Requests BOTTLES DRAWN AEROBIC ONLY 4CC   Final    Culture NO GROWTH 5 DAYS   Final    Report Status 07/24/2012 FINAL   Final   CLOSTRIDIUM DIFFICILE BY PCR     Status: Normal   Collection Time   07/19/12 12:52 PM      Component Value Range Status Comment   C difficile by pcr NEGATIVE  NEGATIVE Final      Hospital Course: She was admitted with fever and generalized discomfort. She had received a flu shot. She was coughing. Chest x-ray was felt to be questionable for pneumonia but I do not believe that she had pneumonia. She had a CT of the chest done that did not show definite pneumonia and in the upper part of the abdomen showed what appeared to be colitis. She then had CT of the abdomen that confirmed the diagnosis of colitis. She was started on Cipro and Flagyl and improved. Her C. difficile was negative. She had GI consultation it was felt that her treatment was appropriate. She continued to improve was switched to oral medications which she tolerated well and was ready for discharge.  Discharge Exam: Blood pressure 148/76, pulse 73, temperature 97.8 F (36.6 C), temperature source Oral, resp. rate 20, height 5\' 5"  (1.651 m), weight 53.524 kg (118 lb), SpO2 98.00%. She was awake and alert. She's very hard of hearing. Her abdomen was much softer. Her chest was clear.  Disposition: Home      Discharge Orders    Future Appointments: Provider: Department: Dept Phone: Center:   09/02/2012 1:30 PM Joselyn Arrow, NP [facility] [phone] [facility]     Future Orders Please Complete By Expires    Discharge patient           Signed: Fredirick Maudlin Pager [phone]  07/25/2012, 8:53 PM

## 2012-09-02 ENCOUNTER — Ambulatory Visit (INDEPENDENT_AMBULATORY_CARE_PROVIDER_SITE_OTHER): Payer: Medicare Other | Admitting: Urgent Care

## 2012-09-02 ENCOUNTER — Encounter: Payer: Self-pay | Admitting: Urgent Care

## 2012-09-02 VITALS — BP 119/64 | HR 82 | Temp 97.7°F | Ht 63.0 in | Wt 115.8 lb

## 2012-09-02 DIAGNOSIS — K219 Gastro-esophageal reflux disease without esophagitis: Secondary | ICD-10-CM

## 2012-09-02 DIAGNOSIS — K559 Vascular disorder of intestine, unspecified: Secondary | ICD-10-CM | POA: Insufficient documentation

## 2012-09-02 NOTE — Patient Instructions (Signed)
Continue prevacid 30mg  daily Call if abdominal pain returns or any other problems Office visit in 6 months

## 2012-09-03 ENCOUNTER — Encounter: Payer: Self-pay | Admitting: Urgent Care

## 2012-09-03 NOTE — Progress Notes (Signed)
Primary Care Physician:  Fredirick Maudlin, MD Primary Gastroenterologist:  Dr. Jena Gauss  Chief Complaint  Patient presents with  . Follow-up    ischemic colitis    HPI:  Elizabeth [name] is a 79 y.o. female here for follow up for ischemic colitis.  She was hospitalized at [facility] for acute abdominal pain & diarrhea.  CT suggested ischemic colitis of splenic flexure/proximal descending colon. She was treated with empiric cipro & flagyl.  She has not had a colonoscopy & is here to discuss this today.  She is doing very well.  She denies any abdominal pain, nausea, vomiting, or diarrhea.  She has had a great appetite.  She really does not want to pursue colonoscopy at this time given her age.  GERD well controlled on Prevacid 30mg  daily.  Past Medical History  Diagnosis Date  . Thyroid goiter     s/p surgery now on Synthroid  . B12 deficiency   . Coronary artery disease   . Zenker diverticulum   . Hyperlipidemia   . Hypertension   . Osteoporosis   . GERD (gastroesophageal reflux disease)   . Hypercholesterolemia   . Hypothyroidism     Past Surgical History  Procedure Laterality Date  . Esophagogastroduodenoscopy  6/10     Schatzki's ring, otherwise endoscopically normal esophagus, status post dilation (41F), with mucosal disruption through the cervical segment as well as the Schatzki's ring. Hiatal hernia. segment as well as the Schatzki's ring. Hiatal hernia.  . Coronary artery bypass graft      Status Post four-vessel   . Bunionectomy    . Appendectomy    . Abdominal hysterectomy    . Esophagogastroduodenoscopy  09/02/2007    Cervical esophageal web, Schatzki's ring, status post  dilation and disruption as described above. Otherwise, normal esophagus  . Esophagogastroduodenoscopy  August 2011    Zenker's diverticulum, Schatzki's ring s/p dilation, mod to large HH, instructions to pursue BPE if recurrent dysphagia to assess Zenker's  . Esophageal dilation    .  Esophagogastroduodenoscopy  03/12/12    Rourk-dilated 18F, cervical web, Schatzki's ring, small HH, small Zenker's diverticulum    Current Outpatient Prescriptions  Medication Sig Dispense Refill  . amLODipine (NORVASC) 5 MG tablet Take 5 mg by mouth daily after breakfast.       . aspirin 325 MG tablet Take 325 mg by mouth daily.      Marland Kitchen atorvastatin (LIPITOR) 10 MG tablet Take 10 mg by mouth every evening.       . lansoprazole (PREVACID) 30 MG capsule Take 30 mg by mouth daily after breakfast.      . levothyroxine (SYNTHROID, LEVOTHROID) 112 MCG tablet Take 112 mcg by mouth daily.      . quinapril (ACCUPRIL) 20 MG tablet Take 20 mg by mouth daily after breakfast.        No current facility-administered medications for this visit.    Allergies as of 09/02/2012  . (No Known Allergies)    Review of Systems: Gen: Denies any fever, chills, sweats, anorexia, fatigue, weakness, malaise, weight loss, and sleep disorder CV: Denies chest pain, angina, palpitations, syncope, orthopnea, PND, peripheral edema, and claudication. Resp: Denies dyspnea at rest, dyspnea with exercise, cough, sputum, wheezing, coughing up blood, and pleurisy. GI: Denies vomiting blood, jaundice, and fecal incontinence.   Derm: Denies rash, itching, dry skin, hives, moles, warts, or unhealing ulcers.  Psych: Denies depression, anxiety, memory loss, suicidal ideation, hallucinations, paranoia, and confusion. Heme: Denies bruising, bleeding, and enlarged  lymph nodes.  Physical Exam: BP 119/64  Pulse 82  Temp(Src) 97.7 F (36.5 C) (Oral)  Ht 5\' 3"  (1.6 m)  Wt 115 lb 12.8 oz (52.527 kg)  BMI 20.52 kg/m2 General:   Alert,  Well-developed, well-nourished, pleasant and cooperative in NAD.  Accompanied by her niece. Eyes:  Sclera clear, no icterus.   Conjunctiva pink. Mouth:  No deformity or lesions, oropharynx pink and moist. Neck:  Supple; no masses or thyromegaly. Heart:  Regular rate and rhythm; no murmurs, clicks,  rubs,  or gallops. Abdomen:  Normal bowel sounds.  No bruits.  Soft, non-tender and non-distended without masses, hepatosplenomegaly or hernias noted.  No guarding or rebound tenderness.   Rectal:  Deferred.  Msk:  Symmetrical without gross deformities.  Pulses:  Normal pulses noted. Extremities:  No edema. Neurologic:  Alert and oriented x4;  grossly normal neurologically. Skin:  Intact without significant lesions or rashes.

## 2012-09-03 NOTE — Assessment & Plan Note (Signed)
Requiring hospitalization.  All symptoms resolved.   She is doing very well.  She tells me today that given the fact she is 87, she really does not want to have a colonoscopy at this time.  She has good insight & understands that colon cancer or polyps could be undiagnosed.  She verbalizes understanding.  Call if any further problems

## 2012-09-03 NOTE — Assessment & Plan Note (Signed)
Well controlled on prevacid 30mg daily.   

## 2012-09-23 ENCOUNTER — Other Ambulatory Visit: Payer: Self-pay

## 2012-09-23 MED ORDER — LANSOPRAZOLE 30 MG PO CPDR: 30.0000 mg | DELAYED_RELEASE_CAPSULE | Freq: Every day | ORAL | Status: DC

## 2012-09-23 NOTE — Addendum Note (Signed)
Addended by: Joselyn Arrow on: 09/23/2012 12:53 PM   Modules accepted: Orders

## 2012-10-06 NOTE — Progress Notes (Signed)
There has been a [employer] on ODT & I am not sure if they are still making them. If she cannot swallow the capsule whole, she may open it and sprinkle the contents over 1 tablespoon of applesauce, Ensure pudding, cottage cheese, yogurt, or strained pears. Swallow the mixture right away. Do not crush or chew the medicine before swallowing. Do not store the mixture for future use.   Thanks

## 2012-10-06 NOTE — Progress Notes (Unsigned)
Pharmacist from [employer] order called- pt is having difficulty swallowing the lansoprazole capsules and she is requesting prevacid ODT. Per AS ok to switch. Pharmacist is aware, no PA is needed.

## 2012-10-07 NOTE — Progress Notes (Signed)
Tried to call pt- NA 

## 2012-10-10 NOTE — Progress Notes (Signed)
Tried to call pt- NA 

## 2012-10-13 NOTE — Progress Notes (Signed)
Pt is aware.  

## 2013-04-15 ENCOUNTER — Encounter (HOSPITAL_COMMUNITY): Payer: Self-pay | Admitting: Emergency Medicine

## 2013-04-15 ENCOUNTER — Emergency Department (HOSPITAL_COMMUNITY)
Admission: EM | Admit: 2013-04-15 | Discharge: 2013-04-15 | Disposition: A | Discharge status: Home / Self Care | Payer: Medicare Other | Attending: Emergency Medicine | Admitting: Emergency Medicine

## 2013-04-15 ENCOUNTER — Emergency Department (HOSPITAL_COMMUNITY): Payer: Medicare Other

## 2013-04-15 DIAGNOSIS — K219 Gastro-esophageal reflux disease without esophagitis: Secondary | ICD-10-CM | POA: Insufficient documentation

## 2013-04-15 DIAGNOSIS — E039 Hypothyroidism, unspecified: Secondary | ICD-10-CM | POA: Insufficient documentation

## 2013-04-15 DIAGNOSIS — Z7982 Long term (current) use of aspirin: Secondary | ICD-10-CM | POA: Insufficient documentation

## 2013-04-15 DIAGNOSIS — R059 Cough, unspecified: Secondary | ICD-10-CM | POA: Insufficient documentation

## 2013-04-15 DIAGNOSIS — Z79899 Other long term (current) drug therapy: Secondary | ICD-10-CM | POA: Insufficient documentation

## 2013-04-15 DIAGNOSIS — Z951 Presence of aortocoronary bypass graft: Secondary | ICD-10-CM | POA: Insufficient documentation

## 2013-04-15 DIAGNOSIS — E7800 Pure hypercholesterolemia, unspecified: Secondary | ICD-10-CM | POA: Insufficient documentation

## 2013-04-15 DIAGNOSIS — E785 Hyperlipidemia, unspecified: Secondary | ICD-10-CM | POA: Insufficient documentation

## 2013-04-15 DIAGNOSIS — R05 Cough: Secondary | ICD-10-CM | POA: Insufficient documentation

## 2013-04-15 DIAGNOSIS — N390 Urinary tract infection, site not specified: Secondary | ICD-10-CM | POA: Insufficient documentation

## 2013-04-15 DIAGNOSIS — Z8739 Personal history of other diseases of the musculoskeletal system and connective tissue: Secondary | ICD-10-CM | POA: Insufficient documentation

## 2013-04-15 DIAGNOSIS — I10 Essential (primary) hypertension: Secondary | ICD-10-CM | POA: Insufficient documentation

## 2013-04-15 DIAGNOSIS — I2510 Atherosclerotic heart disease of native coronary artery without angina pectoris: Secondary | ICD-10-CM | POA: Insufficient documentation

## 2013-04-15 LAB — CBC WITH DIFFERENTIAL/PLATELET
Basophils Absolute: 0.1 10*3/uL (ref 0.0–0.1)
Basophils Relative: 1 % (ref 0–1)
MCHC: 33 g/dL (ref 30.0–36.0)
Monocytes Absolute: 0.6 10*3/uL (ref 0.1–1.0)
Neutro Abs: 5.8 10*3/uL (ref 1.7–7.7)
Neutrophils Relative %: 65 % (ref 43–77)
RDW: 14.6 % (ref 11.5–15.5)

## 2013-04-15 LAB — BASIC METABOLIC PANEL
Chloride: 101 mEq/L (ref 96–112)
Creatinine, Ser: 1.09 mg/dL (ref 0.50–1.10)
GFR calc Af Amer: 50 mL/min — ABNORMAL LOW (ref >90)
GFR calc non Af Amer: 43 mL/min — ABNORMAL LOW (ref >90)

## 2013-04-15 LAB — URINALYSIS, ROUTINE W REFLEX MICROSCOPIC
Ketones, ur: NEGATIVE mg/dL
Nitrite: NEGATIVE
Protein, ur: 100 mg/dL — AB

## 2013-04-15 LAB — URINE MICROSCOPIC-ADD ON

## 2013-04-15 MED ORDER — HYDROMORPHONE HCL PF 1 MG/ML IJ SOLN
0.5000 mg | Freq: Once | INTRAMUSCULAR | Status: AC
  Administered 2013-04-15: 0.5 mg via INTRAVENOUS
  Filled 2013-04-15: qty 1

## 2013-04-15 MED ORDER — IOHEXOL 300 MG/ML  SOLN
100.0000 mL | Freq: Once | INTRAMUSCULAR | Status: AC | PRN
  Administered 2013-04-15: 100 mL via INTRAVENOUS

## 2013-04-15 MED ORDER — DEXTROSE 5 % IV SOLN
1.0000 g | Freq: Once | INTRAVENOUS | Status: AC
  Administered 2013-04-15: 1 g via INTRAVENOUS
  Filled 2013-04-15: qty 10

## 2013-04-15 MED ORDER — IOHEXOL 300 MG/ML  SOLN
50.0000 mL | Freq: Once | INTRAMUSCULAR | Status: AC | PRN
  Administered 2013-04-15: 50 mL via ORAL

## 2013-04-15 MED ORDER — SODIUM CHLORIDE 0.9 % IV BOLUS (SEPSIS)
500.0000 mL | Freq: Once | INTRAVENOUS | Status: AC
  Administered 2013-04-15: 500 mL via INTRAVENOUS

## 2013-04-15 MED ORDER — ONDANSETRON HCL 4 MG/2ML IJ SOLN
4.0000 mg | Freq: Once | INTRAMUSCULAR | Status: AC
  Administered 2013-04-15: 4 mg via INTRAVENOUS
  Filled 2013-04-15: qty 2

## 2013-04-15 MED ORDER — CIPROFLOXACIN HCL 500 MG PO TABS: 500.0000 mg | ORAL_TABLET | Freq: Two times a day (BID) | ORAL | Status: DC

## 2013-04-15 NOTE — ED Notes (Signed)
Pt reports "kidney pain" that began this morning. Pt states "I feel like I have to pee a lot but nothing happens when I go". Pt states she's never had this type of pain before.

## 2013-04-15 NOTE — ED Notes (Signed)
Dr. Jerre Simon arrived to pt's room and successfully inserted foley catheter.

## 2013-04-15 NOTE — ED Notes (Signed)
Patient c/o "kidney pain." Patient pointing to lower abd, moaning in pain.

## 2013-04-15 NOTE — ED Notes (Signed)
Patient reports frequency. Per patient "When I go I only go a little bit."

## 2013-04-15 NOTE — ED Notes (Signed)
Foley catheter was not producing urine so staff re-evaluated positioning of catheter. Staff then attempted another foley catheter without success. Bladder scanner confirms at least of urine in pt's bladder. Dr. Estell Harpin notified and states he will attempt with coude catheter.

## 2013-04-15 NOTE — Consult Note (Signed)
Note #[id number]

## 2013-04-15 NOTE — ED Notes (Signed)
Attached leg bag and instructed caregiver on how to use it.

## 2013-04-15 NOTE — ED Provider Notes (Signed)
CSN: [id number]     Arrival date & time 04/15/13  1511 History  This chart was scribed for Benny Lennert, MD by Ardelia Mems, ED Scribe. This patient was seen in room [facility]09/[facility] and the patient's care was started at 3:28 PM.  Chief Complaint  Patient presents with  . Abdominal Pain    Patient is a 79 y.o. female presenting with abdominal pain. The history is provided by the patient. No language interpreter was used.  Abdominal Pain Pain location:  Suprapubic Pain quality: aching   Pain radiates to:  Does not radiate Pain severity:  Moderate Onset quality:  Gradual Duration:  1 day Timing:  Constant Progression:  Worsening Chronicity:  New Context: awakening from sleep   Relieved by:  Nothing Worsened by:  Nothing tried Ineffective treatments:  None tried Associated symptoms: cough and dysuria   Associated symptoms: no chest pain, no chills, no diarrhea, no fatigue, no fever, no hematuria, no nausea, no shortness of breath and no vomiting   Risk factors: being elderly     HPI Comments: Elizabeth [name] is a 79 y.o. female who presents to the Emergency Department complaining of constant, moderate lower abdominal pain onset this morning described as "aching" and soreness". She reports associated dysuria, and urinary frequency, stating she has been urinating small amounts frequently. She dneies having a history of similar symptoms. She denies fever chills, nausea, emesis or any other symptoms.   Past Medical History  Diagnosis Date  . Thyroid goiter     s/p surgery now on Synthroid  . B12 deficiency   . Coronary artery disease   . Zenker diverticulum   . Hyperlipidemia   . Hypertension   . Osteoporosis   . GERD (gastroesophageal reflux disease)   . Hypercholesterolemia   . Hypothyroidism    Past Surgical History  Procedure Laterality Date  . Esophagogastroduodenoscopy  6/10     Schatzki's ring, otherwise endoscopically normal esophagus, status post dilation (14F), with  mucosal disruption through the cervical segment as well as the Schatzki's ring. Hiatal hernia. segment as well as the Schatzki's ring. Hiatal hernia.  . Coronary artery bypass graft      Status Post four-vessel   . Bunionectomy    . Appendectomy    . Abdominal hysterectomy    . Esophagogastroduodenoscopy  09/02/2007    Cervical esophageal web, Schatzki's ring, status post  dilation and disruption as described above. Otherwise, normal esophagus  . Esophagogastroduodenoscopy  August 2011    Zenker's diverticulum, Schatzki's ring s/p dilation, mod to large HH, instructions to pursue BPE if recurrent dysphagia to assess Zenker's  . Esophageal dilation    . Esophagogastroduodenoscopy  03/12/12    Rourk-dilated 13F, cervical web, Schatzki's ring, small HH, small Zenker's diverticulum   Family History  Problem Relation Age of Onset  . Colon cancer Neg Hx    History  Substance Use Topics  . Smoking status: Never Smoker   . Smokeless tobacco: Not on file  . Alcohol Use: No   OB History   Grav Para Term Preterm Abortions TAB SAB Ect Mult Living                 Review of Systems  Constitutional: Negative for fever, chills, appetite change and fatigue.  HENT: Negative for congestion, sinus pressure and ear discharge.   Eyes: Negative for discharge.  Respiratory: Positive for cough. Negative for shortness of breath.   Cardiovascular: Negative for chest pain.  Gastrointestinal: Positive for abdominal pain.  Negative for nausea, vomiting and diarrhea.  Genitourinary: Positive for dysuria and frequency. Negative for hematuria.  Musculoskeletal: Negative for back pain.  Skin: Negative for rash.  Neurological: Negative for seizures and headaches.  Psychiatric/Behavioral: Negative for hallucinations.  All other systems reviewed and are negative.    Allergies  Review of patient's allergies indicates no known allergies.  Home Medications   Current Outpatient Rx  Name  Route  Sig  Dispense   Refill  . amLODipine (NORVASC) 5 MG tablet   Oral   Take 5 mg by mouth daily after breakfast.          . aspirin 325 MG tablet   Oral   Take 325 mg by mouth daily.         Marland Kitchen atorvastatin (LIPITOR) 10 MG tablet   Oral   Take 10 mg by mouth every evening.          . lansoprazole (PREVACID) 30 MG capsule   Oral   Take 1 capsule (30 mg total) by mouth daily after breakfast.   31 capsule   11   . levothyroxine (SYNTHROID, LEVOTHROID) 112 MCG tablet   Oral   Take 112 mcg by mouth daily.         . quinapril (ACCUPRIL) 20 MG tablet   Oral   Take 20 mg by mouth daily after breakfast.           Triage Vitals; BP 115/60  Pulse 83  Temp(Src) 97.4 F (36.3 C) (Oral)  Resp 20  Ht 5\' 3"  (1.6 m)  Wt 115 lb (52.164 kg)  BMI 20.38 kg/m2  SpO2 96%  Physical Exam  Nursing note and vitals reviewed. Constitutional: She is oriented to person, place, and time. She appears well-developed.  HENT:  Head: Normocephalic.  Eyes: Conjunctivae and EOM are normal. No scleral icterus.  Neck: Neck supple. No thyromegaly present.  Cardiovascular: Normal rate and regular rhythm.  Exam reveals no gallop and no friction rub.   No murmur heard. Pulmonary/Chest: No stridor. She has no wheezes. She has no rales. She exhibits no tenderness.  Abdominal: There is tenderness (Mild suprapubic tenderness). There is no rebound.  Musculoskeletal: Normal range of motion. She exhibits no edema.  Lymphadenopathy:    She has no cervical adenopathy.  Neurological: She is oriented to person, place, and time. She exhibits normal muscle tone. Coordination normal.  Skin: No rash noted. No erythema.  Psychiatric: She has a normal mood and affect. Her behavior is normal.    ED Course  Procedures (including critical care time)  DIAGNOSTIC STUDIES: Oxygen Saturation is 96% on RA, adequate by my interpretation.    COORDINATION OF CARE: 3:32 PM- Pt and relative advised of plan for treatment, including plan  to obtain medications along with diagnostic lab work and radiology and pt agrees.  Medications  sodium chloride 0.9 % bolus 500 mL (0 mLs Intravenous Stopped 04/15/13 1805)  sodium chloride 0.9 % bolus 500 mL (0 mLs Intravenous Stopped 04/15/13 1954)  iohexol (OMNIPAQUE) 300 MG/ML solution 50 mL (50 mLs Oral Contrast Given 04/15/13 1809)  HYDROmorphone (DILAUDID) injection 0.5 mg (0.5 mg Intravenous Given 04/15/13 1814)  HYDROmorphone (DILAUDID) injection 0.5 mg (0.5 mg Intravenous Given 04/15/13 1930)  ondansetron (ZOFRAN) injection 4 mg (4 mg Intravenous Given 04/15/13 2130)    Labs Review Labs Reviewed  CBC WITH DIFFERENTIAL - Abnormal; Notable for the following:    Hemoglobin 11.8 (*)    HCT 35.8 (*)    All other  components within normal limits  BASIC METABOLIC PANEL - Abnormal; Notable for the following:    GFR calc non Af Amer 43 (*)    GFR calc Af Amer 50 (*)    All other components within normal limits  URINALYSIS, ROUTINE W REFLEX MICROSCOPIC   Imaging Review No results found.  MDM  No diagnosis found. Uti,  With retension.  Dr. Jerre Simon saw pt and placed foley.  He will see the pt in 2 weeks   The chart was scribed for me under my direct supervision.  I personally performed the history, physical, and medical decision making and all procedures in the evaluation of this patient.Benny Lennert, MD 04/15/13 2258

## 2013-04-15 NOTE — ED Notes (Signed)
Dr. Estell Harpin and another RN attempted to drain bladder with coude catheter without success. Urologist called to evaluate pt.

## 2013-04-16 NOTE — Consult Note (Signed)
[name], Elizabeth [name]                ACCOUNT NO.:  [id number]  MEDICAL RECORD NO.:  [id number]  LOCATION:  [facility]09                         FACILITY:  [facility]  PHYSICIAN:  Ky Barban, M.D.DATE OF BIRTH:  31-Oct-1918  DATE OF CONSULTATION:  04/15/2013 DATE OF DISCHARGE:  04/15/2013                                CONSULTATION   CHIEF COMPLAINT:  Urinary retention.  HISTORY:  A 79 year old female who complains of lower abdominal pain and inability to urinate.  No fever, chills, or gross hematuria.  No history of similar problems in the past.  No fever, chills, or nausea, vomiting.  PAST MEDICAL HISTORY:  History of thyroid goiter, she had surgery, so she is on Synthroid, B12 deficiency, coronary artery disease, Zenker's diverticulum, hyperlipidemia, hypertension, osteoporosis, GERD, hypercholesterolemia, and hypothyroidism.  PAST SURGICAL HISTORY:  Include procedure is upper GI endoscopy, coronary artery bypass graft, 4-vessel graft, bunionectomy, appendectomy, abdominal hysterectomy and upper endoscopy.  FAMILY HISTORY:  Colon cancer runs in her family.  PERSONAL HISTORY:  Never smoked.  Does not take any illicit drugs. Never drinks.  REVIEW OF SYSTEM:  Otherwise unremarkable.  ALLERGIES:  No known drug allergies.  PHYSICAL EXAMINATION:  GENERAL:  Moderately built female, not in significant distress, fully conscious, alert, oriented.  ABDOMEN:  Soft, flat.  Liver, spleen, and kidneys are not palpable.  Bladder appears to be distended and slightly tenderness in the suprapubic area.  PELVIC: She has atrophic vagina.  No abdominal mass.  The bladder appears to be distended on vaginal examination.  IMPRESSION:  Acute urinary retention, atrophic vaginitis.  I was able to insert Foley catheter with the help of a stylet.  It is draining clear urine.  She feels better, and at this point, I left the room.     Ky Barban, M.D.     MIJ/MEDQ  D:  04/15/2013  T:   04/16/2013  Job:  [id number]

## 2013-04-17 LAB — URINE CULTURE

## 2013-05-23 DIAGNOSIS — A0472 Enterocolitis due to Clostridium difficile, not specified as recurrent: Secondary | ICD-10-CM

## 2013-05-23 HISTORY — DX: Enterocolitis due to Clostridium difficile, not specified as recurrent: A04.72

## 2013-05-29 ENCOUNTER — Inpatient Hospital Stay (HOSPITAL_COMMUNITY)
Admission: EM | Admit: 2013-05-29 | Discharge: 2013-06-01 | DRG: 372 | Disposition: A | Discharge status: Home Health | Payer: Medicare Other | Attending: Pulmonary Disease | Admitting: Pulmonary Disease

## 2013-05-29 ENCOUNTER — Encounter (HOSPITAL_COMMUNITY): Payer: Self-pay | Admitting: Emergency Medicine

## 2013-05-29 ENCOUNTER — Emergency Department (HOSPITAL_COMMUNITY): Payer: Medicare Other

## 2013-05-29 DIAGNOSIS — M810 Age-related osteoporosis without current pathological fracture: Secondary | ICD-10-CM | POA: Diagnosis present

## 2013-05-29 DIAGNOSIS — E860 Dehydration: Secondary | ICD-10-CM | POA: Diagnosis present

## 2013-05-29 DIAGNOSIS — I10 Essential (primary) hypertension: Secondary | ICD-10-CM | POA: Diagnosis present

## 2013-05-29 DIAGNOSIS — I959 Hypotension, unspecified: Secondary | ICD-10-CM | POA: Diagnosis present

## 2013-05-29 DIAGNOSIS — K921 Melena: Secondary | ICD-10-CM | POA: Diagnosis present

## 2013-05-29 DIAGNOSIS — E7800 Pure hypercholesterolemia, unspecified: Secondary | ICD-10-CM | POA: Diagnosis present

## 2013-05-29 DIAGNOSIS — A0472 Enterocolitis due to Clostridium difficile, not specified as recurrent: Principal | ICD-10-CM | POA: Diagnosis present

## 2013-05-29 DIAGNOSIS — Z951 Presence of aortocoronary bypass graft: Secondary | ICD-10-CM

## 2013-05-29 DIAGNOSIS — D62 Acute posthemorrhagic anemia: Secondary | ICD-10-CM | POA: Diagnosis present

## 2013-05-29 DIAGNOSIS — K529 Noninfective gastroenteritis and colitis, unspecified: Secondary | ICD-10-CM

## 2013-05-29 DIAGNOSIS — E785 Hyperlipidemia, unspecified: Secondary | ICD-10-CM | POA: Diagnosis present

## 2013-05-29 DIAGNOSIS — K219 Gastro-esophageal reflux disease without esophagitis: Secondary | ICD-10-CM | POA: Diagnosis present

## 2013-05-29 DIAGNOSIS — E538 Deficiency of other specified B group vitamins: Secondary | ICD-10-CM | POA: Diagnosis present

## 2013-05-29 DIAGNOSIS — N179 Acute kidney failure, unspecified: Secondary | ICD-10-CM | POA: Diagnosis present

## 2013-05-29 DIAGNOSIS — I2510 Atherosclerotic heart disease of native coronary artery without angina pectoris: Secondary | ICD-10-CM | POA: Diagnosis present

## 2013-05-29 DIAGNOSIS — E039 Hypothyroidism, unspecified: Secondary | ICD-10-CM | POA: Diagnosis present

## 2013-05-29 DIAGNOSIS — Z66 Do not resuscitate: Secondary | ICD-10-CM | POA: Diagnosis present

## 2013-05-29 DIAGNOSIS — K5289 Other specified noninfective gastroenteritis and colitis: Secondary | ICD-10-CM

## 2013-05-29 LAB — COMPREHENSIVE METABOLIC PANEL
AST: 21 U/L (ref 0–37)
Albumin: 2.9 g/dL — ABNORMAL LOW (ref 3.5–5.2)
BUN: 35 mg/dL — ABNORMAL HIGH (ref 6–23)
Calcium: 9.3 mg/dL (ref 8.4–10.5)
Creatinine, Ser: 1.16 mg/dL — ABNORMAL HIGH (ref 0.50–1.10)
GFR calc non Af Amer: 40 mL/min — ABNORMAL LOW (ref >90)
Total Bilirubin: 0.6 mg/dL (ref 0.3–1.2)
Total Protein: 6.8 g/dL (ref 6.0–8.3)

## 2013-05-29 LAB — CBC WITH DIFFERENTIAL/PLATELET
Basophils Absolute: 0 10*3/uL (ref 0.0–0.1)
Basophils Relative: 0 % (ref 0–1)
Eosinophils Absolute: 0 10*3/uL (ref 0.0–0.7)
HCT: 35.1 % — ABNORMAL LOW (ref 36.0–46.0)
Hemoglobin: 11.4 g/dL — ABNORMAL LOW (ref 12.0–15.0)
Lymphocytes Relative: 20 % (ref 12–46)
MCH: 27 pg (ref 26.0–34.0)
MCHC: 32.5 g/dL (ref 30.0–36.0)
Monocytes Absolute: 0.5 10*3/uL (ref 0.1–1.0)
Monocytes Relative: 7 % (ref 3–12)
Neutro Abs: 6.1 10*3/uL (ref 1.7–7.7)
Platelets: 190 10*3/uL (ref 150–400)
RBC: 4.23 MIL/uL (ref 3.87–5.11)
RDW: 14.6 % (ref 11.5–15.5)

## 2013-05-29 LAB — URINALYSIS, ROUTINE W REFLEX MICROSCOPIC
Bilirubin Urine: NEGATIVE
Glucose, UA: NEGATIVE mg/dL
Leukocytes, UA: NEGATIVE
Protein, ur: NEGATIVE mg/dL
Specific Gravity, Urine: 1.02 (ref 1.005–1.030)
pH: 6 (ref 5.0–8.0)

## 2013-05-29 LAB — OCCULT BLOOD, POC DEVICE: Fecal Occult Bld: POSITIVE — AB

## 2013-05-29 MED ORDER — METRONIDAZOLE IN NACL 5-0.79 MG/ML-% IV SOLN
500.0000 mg | Freq: Once | INTRAVENOUS | Status: DC
  Filled 2013-05-29: qty 100

## 2013-05-29 MED ORDER — IOHEXOL 300 MG/ML  SOLN
100.0000 mL | Freq: Once | INTRAMUSCULAR | Status: AC | PRN
  Administered 2013-05-29: 100 mL via INTRAVENOUS

## 2013-05-29 MED ORDER — SODIUM CHLORIDE 0.9 % IV BOLUS (SEPSIS)
1000.0000 mL | Freq: Once | INTRAVENOUS | Status: AC
  Administered 2013-05-29: 1000 mL via INTRAVENOUS

## 2013-05-29 MED ORDER — IOHEXOL 300 MG/ML  SOLN
50.0000 mL | Freq: Once | INTRAMUSCULAR | Status: AC | PRN
  Administered 2013-05-29: 50 mL via ORAL

## 2013-05-29 MED ORDER — CIPROFLOXACIN IN D5W 400 MG/200ML IV SOLN
400.0000 mg | Freq: Once | INTRAVENOUS | Status: AC
  Administered 2013-05-29: 400 mg via INTRAVENOUS
  Filled 2013-05-29: qty 200

## 2013-05-29 NOTE — H&P (Signed)
[facility] History and Physical  Elizabeth [name]:[id number] DOB: 1918/11/03 DOA: 05/29/2013   PCP: Fredirick Maudlin, MD  Specialists: She has seen gastroenterology in the past  Chief Complaint: Diarrhea with blood in stool  HPI: Elizabeth [name] is a 79 y.o. female with a past with history of hypertension, hypothyroidism who was admitted earlier this year with similar problem and was diagnosed with ischemic colitis. Patient is a very poor historian. She is slightly confused. I spoke to her nephew, but the patient doesn't live with him. Couldn't get a hold of the patient's neice. However, it appears, that she's been having diarrhea for the last few days and has had lower abdominal pain. Initially, there was confusion regarding blood in the urine versus blood in the stool, but it was later found out that she was actually having some blood in the stool. Patient complains of lower abdominal pain. There's been no history of nausea, vomiting. No changes in her medications recently. No antibiotics recently. No history of fever or chills. She was found to have low blood pressure when she presented initially to the hospital, which has improved with IV fluids. So history is very limited at this time due to patient's mental status.  Home Medications: Prior to Admission medications   Medication Sig Start Date End Date Taking? Authorizing Provider  amLODipine (NORVASC) 5 MG tablet Take 5 mg by mouth daily after breakfast.  12/10/11  Yes Historical Provider, MD  aspirin 325 MG tablet Take 325 mg by mouth every morning.    Yes Historical Provider, MD  atorvastatin (LIPITOR) 10 MG tablet Take 10 mg by mouth every morning.  12/10/11  Yes Historical Provider, MD  levothyroxine (SYNTHROID, LEVOTHROID) 100 MCG tablet Take 100 mcg by mouth daily before breakfast.   Yes Historical Provider, MD  quinapril (ACCUPRIL) 20 MG tablet Take 20 mg by mouth daily after breakfast.  12/10/11  Yes Historical Provider, MD     Allergies: No Known Allergies  Past Medical History: Past Medical History  Diagnosis Date  . Thyroid goiter     s/p surgery now on Synthroid  . B12 deficiency   . Coronary artery disease   . Zenker diverticulum   . Hyperlipidemia   . Hypertension   . Osteoporosis   . GERD (gastroesophageal reflux disease)   . Hypercholesterolemia   . Hypothyroidism     Past Surgical History  Procedure Laterality Date  . Esophagogastroduodenoscopy  6/10     Schatzki's ring, otherwise endoscopically normal esophagus, status post dilation (68F), with mucosal disruption through the cervical segment as well as the Schatzki's ring. Hiatal hernia. segment as well as the Schatzki's ring. Hiatal hernia.  . Coronary artery bypass graft      Status Post four-vessel   . Bunionectomy    . Appendectomy    . Abdominal hysterectomy    . Esophagogastroduodenoscopy  09/02/2007    Cervical esophageal web, Schatzki's ring, status post  dilation and disruption as described above. Otherwise, normal esophagus  . Esophagogastroduodenoscopy  August 2011    Zenker's diverticulum, Schatzki's ring s/p dilation, mod to large HH, instructions to pursue BPE if recurrent dysphagia to assess Zenker's  . Esophageal dilation    . Esophagogastroduodenoscopy  03/12/12    Rourk-dilated 52F, cervical web, Schatzki's ring, small HH, small Zenker's diverticulum    Social History: Patient lives with her neice. No further information is available at this time.  Family History:  Family History  Problem Relation Age of Onset  .  Colon cancer Neg Hx    family history is unknown and the patient is unable to provide.  Review of Systems - unable to obtain due to patient's confusion  Physical Examination  Filed Vitals:   05/29/13 1600 05/29/13 2009 05/29/13 2300  BP: 97/53 118/54 122/78  Pulse: 94 74 78  Temp: 98.1 F (36.7 C)    TempSrc: Oral    Resp: 20 18 20   SpO2: 100% 98% 100%    General appearance: alert,  cooperative, appears stated age and no distress Head: Normocephalic, without obvious abnormality, atraumatic Eyes: conjunctivae/corneas clear. PERRL, EOM's intact. Throat: lips, mucosa, and tongue normal; teeth and gums normal Neck: no adenopathy, no carotid bruit, no JVD, supple, symmetrical, trachea midline and thyroid not enlarged, symmetric, no tenderness/mass/nodules Resp: clear to auscultation bilaterally Cardio: regular rate and rhythm, S1, S2 normal, no murmur, click, rub or gallop GI: Abdomen is soft. There is tenderness in the left lower quadrant and the left upper quadrant. No rebound, rigidity, or guarding. No masses, or organomegaly. Bowel sounds are present. Extremities: extremities normal, atraumatic, no cyanosis or edema Pulses: 2+ and symmetric Skin: Skin color, texture, turgor normal. No rashes or lesions Lymph nodes: Cervical, supraclavicular, and axillary nodes normal. Neurologic: She is alert, but somewhat confused. She is extremely hard of hearing. She was able to move all her extremities and follow certain commands. No focal deficits appreciated.  Laboratory Data: Results for orders placed during the hospital encounter of 05/29/13 (from the past 48 hour(s))  CBC WITH DIFFERENTIAL     Status: Abnormal   Collection Time    05/29/13  5:33 PM      Result Value Range   WBC 8.4  4.0 - 10.5 K/uL   RBC 4.23  3.87 - 5.11 MIL/uL   Hemoglobin 11.4 (*) 12.0 - 15.0 g/dL   HCT 08.6 (*) 57.8 - 46.9 %   MCV 83.0  78.0 - 100.0 fL   MCH 27.0  26.0 - 34.0 pg   MCHC 32.5  30.0 - 36.0 g/dL   RDW 62.9  52.8 - 41.3 %   Platelets 190  150 - 400 K/uL   Neutrophils Relative % 73  43 - 77 %   Neutro Abs 6.1  1.7 - 7.7 K/uL   Lymphocytes Relative 20  12 - 46 %   Lymphs Abs 1.7  0.7 - 4.0 K/uL   Monocytes Relative 7  3 - 12 %   Monocytes Absolute 0.5  0.1 - 1.0 K/uL   Eosinophils Relative 0  0 - 5 %   Eosinophils Absolute 0.0  0.0 - 0.7 K/uL   Basophils Relative 0  0 - 1 %   Basophils  Absolute 0.0  0.0 - 0.1 K/uL  COMPREHENSIVE METABOLIC PANEL     Status: Abnormal   Collection Time    05/29/13  5:33 PM      Result Value Range   Sodium 138  135 - 145 mEq/L   Potassium 4.8  3.5 - 5.1 mEq/L   Chloride 102  96 - 112 mEq/L   CO2 25  19 - 32 mEq/L   Glucose, Bld 124 (*) 70 - 99 mg/dL   BUN 35 (*) 6 - 23 mg/dL   Creatinine, Ser 2.44 (*) 0.50 - 1.10 mg/dL   Calcium 9.3  8.4 - 01.0 mg/dL   Total Protein 6.8  6.0 - 8.3 g/dL   Albumin 2.9 (*) 3.5 - 5.2 g/dL   AST 21  0 - 37  U/L   ALT 12  0 - 35 U/L   Alkaline Phosphatase 86  39 - 117 U/L   Total Bilirubin 0.6  0.3 - 1.2 mg/dL   GFR calc non Af Amer 40 (*) >90 mL/min   GFR calc Af Amer 46 (*) >90 mL/min   Comment: (NOTE)     The eGFR has been calculated using the CKD EPI equation.     This calculation has not been validated in all clinical situations.     eGFR's persistently <90 mL/min signify possible Chronic Kidney     Disease.  URINALYSIS, ROUTINE W REFLEX MICROSCOPIC     Status: Abnormal   Collection Time    05/29/13  8:43 PM      Result Value Range   Color, Urine AMBER (*) YELLOW   Comment: BIOCHEMICALS MAY BE AFFECTED BY COLOR   APPearance CLEAR  CLEAR   Specific Gravity, Urine 1.020  1.005 - 1.030   pH 6.0  5.0 - 8.0   Glucose, UA NEGATIVE  NEGATIVE mg/dL   Hgb urine dipstick NEGATIVE  NEGATIVE   Bilirubin Urine NEGATIVE  NEGATIVE   Ketones, ur NEGATIVE  NEGATIVE mg/dL   Protein, ur NEGATIVE  NEGATIVE mg/dL   Urobilinogen, UA 0.2  0.0 - 1.0 mg/dL   Nitrite NEGATIVE  NEGATIVE   Leukocytes, UA NEGATIVE  NEGATIVE   Comment: MICROSCOPIC NOT DONE ON URINES WITH NEGATIVE PROTEIN, BLOOD, LEUKOCYTES, NITRITE, OR GLUCOSE <1000 mg/dL.  OCCULT BLOOD, POC DEVICE     Status: Abnormal   Collection Time    05/29/13  9:46 PM      Result Value Range   Fecal Occult Bld POSITIVE (*) NEGATIVE    Radiology Reports: Ct Abdomen Pelvis W Contrast  05/29/2013   CLINICAL DATA:  Hematuria; diarrhea. Lower abdominal pain and  vomiting.  EXAM: CT ABDOMEN AND PELVIS WITH CONTRAST  TECHNIQUE: Multidetector CT imaging of the abdomen and pelvis was performed using the standard protocol following bolus administration of intravenous contrast.  CONTRAST:  100 mL of Omnipaque 300 IV contrast  COMPARISON:  CT of the abdomen and pelvis performed 04/15/2013  FINDINGS: Mild bibasilar atelectasis or scarring is noted. The patient is status post median sternotomy. A moderate to large hiatal hernia is seen, partially filled with contrast.  The liver and spleen are unremarkable in appearance. The gallbladder is within normal limits. The pancreas and adrenal glands are unremarkable.  Bilateral renal atrophy is noted, more prominent on the left, with areas of renal scarring. There is no evidence of hydronephrosis. No renal or ureteral stones are seen. No perinephric stranding is appreciated.  No free fluid is identified. The small bowel is unremarkable in appearance. The stomach is within normal limits. No acute vascular abnormalities are seen. Scattered calcification is noted along the abdominal aorta and its branches.  The patient is status post appendectomy.  Note is made of diffuse colonic wall thickening involving the distal transverse and descending colon, with surrounding soft tissue inflammation, compatible with colitis. This is most likely infectious in nature, though an inflammatory process might have a similar appearance. There is no definite evidence for ischemia. No significant free fluid is seen; there is no evidence of abscess formation. The remainder of the colon is unremarkable in appearance. Contrast progresses to the distal sigmoid colon.  The bladder is moderately distended and grossly unremarkable in appearance. The prostate remains normal in size. No inguinal lymphadenopathy is seen.  No acute osseous abnormalities are identified.  IMPRESSION: 1.  Diffuse colitis involving the distal transverse and descending colon, with surrounding  soft tissue inflammation. This is most likely infectious in nature, though an inflammatory process might have a similar appearance. 2. Bilateral renal atrophy, more prominent on the left, with areas of renal scarring. 3. Moderate to large hiatal hernia, partially filled with contrast. 4. Mild bibasilar atelectasis or scarring noted. 5. Scattered calcification along the abdominal aorta and its branches.   Electronically Signed   By: Roanna Raider M.D.   On: 05/29/2013 23:30     Problem List  Principal Problem:   Acute colitis Active Problems:   Hematochezia   HTN (hypertension), benign   Hypothyroidism   Hypotension, unspecified   ARF (acute renal failure)   Anemia due to acute blood loss   Assessment: This is a 79 year old, Caucasian female, who presents with the episodes of diarrhea with the blood in stool. CT of the abdomen, pelvis shows evidence for diffuse colitis involving the distal transverse and the descending colon. Differential diagnoses include infectious versus ischemic colitis. She also appears to be dehydrated with mild acute renal failure  Plan: #1 acute colitis with hematochezia: She'll be placed on ciprofloxacin and Flagyl. Stool will be sent off for pathogen panel. Contact precautions will be utilized. She was seen by gastroenterology earlier this year and she refused colonoscopy. We will defer GI consultation to her primary care physician at this time. Pain control as needed.  #2 anemia due to acute blood loss: Hemoglobin is actually quite close to what it was a few weeks ago. We will monitor hemoglobin closely and transfuse as needed.  #3 hypotension: Blood pressure was low initially, when she came in, but has improved with IV fluids. Monitor closely on telemetry.  #3 history of hypertension: I discussed with the nephew regarding her blood pressures at home. These are not being monitored closely. I have recommended that blood pressure be monitored closely while she is  at home. Is quite possible that she may be experiencing asymptomatic hypotension causing ischemic colitis.  #4 history of hypothyroidism: Continue with her thyroid replacement therapy.  #5 dehydration with mild acute renal failure: IV fluids will be provided and renal function will be monitored closely.   DVT Prophylaxis: SCDs Code Status: CODE STATUS was discussed with the patient's nephew Thornton Dales, who is her healthcare power of attorney. She is a DNR/DNI. Family Communication: Discussed with the patient's nephew. His contact information is under demographics.  Disposition Plan: Admit to telemetry for now   Further management decisions will depend on results of further testing and patient's response to treatment.  [facility]  [facility] Pager [phone]  If 7PM-7AM, please contact night-coverage [email or web address] Password [facility]  05/29/2013, 11:57 PM

## 2013-05-29 NOTE — ED Notes (Signed)
Pt has had 2 liquid brown foul smelling stools with a small amt flecks of bright red blood.

## 2013-05-29 NOTE — ED Notes (Signed)
Hematuria, frequency diarrhea low abd pain,vomiting

## 2013-05-29 NOTE — ED Provider Notes (Signed)
CSN: [id number]     Arrival date & time 05/29/13  1535 History   First MD Initiated Contact with Patient 05/29/13 1842     Chief Complaint  Patient presents with  . Hematuria   is extremely vague historian. History is obtained from patient or patient's nephew who accompanies her (Consider location/radiation/quality/duration/timing/severity/associated sxs/prior Treatment) HPI Plan of low abdominal pain since today. Also complains of diarrhea. She is uncertain whether she has hematuria or blood per rectum. She denies dysuria denies fever denies vomiting. She is presently hungry. No treatment prior to coming here nothing makes symptoms better or worse. No other associated symptoms. Past Medical History  Diagnosis Date  . Thyroid goiter     s/p surgery now on Synthroid  . B12 deficiency   . Coronary artery disease   . Zenker diverticulum   . Hyperlipidemia   . Hypertension   . Osteoporosis   . GERD (gastroesophageal reflux disease)   . Hypercholesterolemia   . Hypothyroidism    diverticulitis Past Surgical History  Procedure Laterality Date  . Esophagogastroduodenoscopy  6/10     Schatzki's ring, otherwise endoscopically normal esophagus, status post dilation (32F), with mucosal disruption through the cervical segment as well as the Schatzki's ring. Hiatal hernia. segment as well as the Schatzki's ring. Hiatal hernia.  . Coronary artery bypass graft      Status Post four-vessel   . Bunionectomy    . Appendectomy    . Abdominal hysterectomy    . Esophagogastroduodenoscopy  09/02/2007    Cervical esophageal web, Schatzki's ring, status post  dilation and disruption as described above. Otherwise, normal esophagus  . Esophagogastroduodenoscopy  August 2011    Zenker's diverticulum, Schatzki's ring s/p dilation, mod to large HH, instructions to pursue BPE if recurrent dysphagia to assess Zenker's  . Esophageal dilation    . Esophagogastroduodenoscopy  03/12/12    Rourk-dilated 49F,  cervical web, Schatzki's ring, small HH, small Zenker's diverticulum   Family History  Problem Relation Age of Onset  . Colon cancer Neg Hx    History  Substance Use Topics  . Smoking status: Never Smoker   . Smokeless tobacco: Never Used  . Alcohol Use: No   OB History   Grav Para Term Preterm Abortions TAB SAB Ect Mult Living            0     Review of Systems  Constitutional: Negative.   HENT: Negative.   Respiratory: Negative.   Cardiovascular: Negative.   Gastrointestinal: Positive for abdominal pain and diarrhea.  Genitourinary: Positive for hematuria.  Musculoskeletal: Negative.   Skin: Negative.   Neurological: Negative.   Psychiatric/Behavioral: Negative.   All other systems reviewed and are negative.    Allergies  Review of patient's allergies indicates no known allergies.  Home Medications   Current Outpatient Rx  Name  Route  Sig  Dispense  Refill  . amLODipine (NORVASC) 5 MG tablet   Oral   Take 5 mg by mouth daily after breakfast.          . aspirin 325 MG tablet   Oral   Take 325 mg by mouth daily.         Marland Kitchen atorvastatin (LIPITOR) 10 MG tablet   Oral   Take 10 mg by mouth every evening.          . ciprofloxacin (CIPRO) 500 MG tablet   Oral   Take 1 tablet (500 mg total) by mouth 2 (two) times daily. One po  bid x 7 days   14 tablet   0   . lansoprazole (PREVACID) 30 MG capsule   Oral   Take 1 capsule (30 mg total) by mouth daily after breakfast.   31 capsule   11   . levothyroxine (SYNTHROID, LEVOTHROID) 112 MCG tablet   Oral   Take 112 mcg by mouth daily.         . quinapril (ACCUPRIL) 20 MG tablet   Oral   Take 20 mg by mouth daily after breakfast.           BP 97/53  Pulse 94  Temp(Src) 98.1 F (36.7 C) (Oral)  Resp 20  SpO2 100% Physical Exam  Nursing note and vitals reviewed. Constitutional: She appears well-developed and well-nourished.  HENT:  Head: Normocephalic and atraumatic.  Eyes: Conjunctivae are  normal. Pupils are equal, round, and reactive to light.  Neck: Neck supple. No tracheal deviation present. No thyromegaly present.  Cardiovascular: Normal rate and regular rhythm.   No murmur heard. Pulmonary/Chest: Effort normal and breath sounds normal.  Abdominal: Soft. Bowel sounds are normal. She exhibits no distension. There is tenderness.  Tender at infraumbilical area  Genitourinary: Vagina normal. Guaiac positive stool.  No gross blood on bimanual exam. Rectum normal tone blood tinged stool  Musculoskeletal: Normal range of motion. She exhibits no edema and no tenderness.  Neurological: She is alert. Coordination normal.  Skin: Skin is warm and dry. No rash noted.  Psychiatric: She has a normal mood and affect.    ED Course  Procedures (including critical care time) Labs Review Labs Reviewed  CBC WITH DIFFERENTIAL - Abnormal; Notable for the following:    Hemoglobin 11.4 (*)    HCT 35.1 (*)    All other components within normal limits  COMPREHENSIVE METABOLIC PANEL - Abnormal; Notable for the following:    Glucose, Bld 124 (*)    BUN 35 (*)    Creatinine, Ser 1.16 (*)    Albumin 2.9 (*)    GFR calc non Af Amer 40 (*)    GFR calc Af Amer 46 (*)    All other components within normal limits  URINALYSIS, ROUTINE W REFLEX MICROSCOPIC   Imaging Review No results found.  EKG Interpretation   None      Results for orders placed during the hospital encounter of 05/29/13  CBC WITH DIFFERENTIAL      Result Value Range   WBC 8.4  4.0 - 10.5 K/uL   RBC 4.23  3.87 - 5.11 MIL/uL   Hemoglobin 11.4 (*) 12.0 - 15.0 g/dL   HCT 45.4 (*) 09.8 - 11.9 %   MCV 83.0  78.0 - 100.0 fL   MCH 27.0  26.0 - 34.0 pg   MCHC 32.5  30.0 - 36.0 g/dL   RDW 14.7  82.9 - 56.2 %   Platelets 190  150 - 400 K/uL   Neutrophils Relative % 73  43 - 77 %   Neutro Abs 6.1  1.7 - 7.7 K/uL   Lymphocytes Relative 20  12 - 46 %   Lymphs Abs 1.7  0.7 - 4.0 K/uL   Monocytes Relative 7  3 - 12 %    Monocytes Absolute 0.5  0.1 - 1.0 K/uL   Eosinophils Relative 0  0 - 5 %   Eosinophils Absolute 0.0  0.0 - 0.7 K/uL   Basophils Relative 0  0 - 1 %   Basophils Absolute 0.0  0.0 - 0.1 K/uL  COMPREHENSIVE METABOLIC PANEL      Result Value Range   Sodium 138  135 - 145 mEq/L   Potassium 4.8  3.5 - 5.1 mEq/L   Chloride 102  96 - 112 mEq/L   CO2 25  19 - 32 mEq/L   Glucose, Bld 124 (*) 70 - 99 mg/dL   BUN 35 (*) 6 - 23 mg/dL   Creatinine, Ser 1.61 (*) 0.50 - 1.10 mg/dL   Calcium 9.3  8.4 - 09.6 mg/dL   Total Protein 6.8  6.0 - 8.3 g/dL   Albumin 2.9 (*) 3.5 - 5.2 g/dL   AST 21  0 - 37 U/L   ALT 12  0 - 35 U/L   Alkaline Phosphatase 86  39 - 117 U/L   Total Bilirubin 0.6  0.3 - 1.2 mg/dL   GFR calc non Af Amer 40 (*) >90 mL/min   GFR calc Af Amer 46 (*) >90 mL/min  URINALYSIS, ROUTINE W REFLEX MICROSCOPIC      Result Value Range   Color, Urine AMBER (*) YELLOW   APPearance CLEAR  CLEAR   Specific Gravity, Urine 1.020  1.005 - 1.030   pH 6.0  5.0 - 8.0   Glucose, UA NEGATIVE  NEGATIVE mg/dL   Hgb urine dipstick NEGATIVE  NEGATIVE   Bilirubin Urine NEGATIVE  NEGATIVE   Ketones, ur NEGATIVE  NEGATIVE mg/dL   Protein, ur NEGATIVE  NEGATIVE mg/dL   Urobilinogen, UA 0.2  0.0 - 1.0 mg/dL   Nitrite NEGATIVE  NEGATIVE   Leukocytes, UA NEGATIVE  NEGATIVE   No results found.  10:35 PM patient resting comfortably. MDM  No diagnosis found. Pt signed out to Dr. Estell Harpin 1135 pm No evidence of hematuria. Bleeding is coming from right Diagnosis #1 GI bleed #2 abdominal pain #3 hypotension #4 anemia #5 renal insufficiency   Doug Sou, MD 05/29/13 2241

## 2013-05-29 NOTE — ED Notes (Signed)
Patient up to bedside commode with assist to have bowel movement.

## 2013-05-30 DIAGNOSIS — K5289 Other specified noninfective gastroenteritis and colitis: Secondary | ICD-10-CM

## 2013-05-30 LAB — TYPE AND SCREEN
ABO/RH(D): O POS
Antibody Screen: NEGATIVE

## 2013-05-30 LAB — COMPREHENSIVE METABOLIC PANEL
AST: 17 U/L (ref 0–37)
Alkaline Phosphatase: 64 U/L (ref 39–117)
CO2: 22 mEq/L (ref 19–32)
Calcium: 7.8 mg/dL — ABNORMAL LOW (ref 8.4–10.5)
Creatinine, Ser: 1.07 mg/dL (ref 0.50–1.10)
GFR calc Af Amer: 51 mL/min — ABNORMAL LOW (ref >90)
GFR calc non Af Amer: 44 mL/min — ABNORMAL LOW (ref >90)
Glucose, Bld: 95 mg/dL (ref 70–99)

## 2013-05-30 LAB — CBC
Hemoglobin: 9.7 g/dL — ABNORMAL LOW (ref 12.0–15.0)
RBC: 3.61 MIL/uL — ABNORMAL LOW (ref 3.87–5.11)
RDW: 14.7 % (ref 11.5–15.5)

## 2013-05-30 MED ORDER — MORPHINE SULFATE 2 MG/ML IJ SOLN: 0.5000 mg | INTRAMUSCULAR | Status: DC | PRN

## 2013-05-30 MED ORDER — SODIUM CHLORIDE 0.9 % IV SOLN
INTRAVENOUS | Status: AC
  Administered 2013-05-30: 03:00:00 via INTRAVENOUS

## 2013-05-30 MED ORDER — ACETAMINOPHEN 650 MG RE SUPP: 650.0000 mg | Freq: Four times a day (QID) | RECTAL | Status: DC | PRN

## 2013-05-30 MED ORDER — CIPROFLOXACIN IN D5W 400 MG/200ML IV SOLN
400.0000 mg | Freq: Two times a day (BID) | INTRAVENOUS | Status: DC
  Administered 2013-05-30 – 2013-05-31 (×4): 400 mg via INTRAVENOUS
  Filled 2013-05-30 (×5): qty 200

## 2013-05-30 MED ORDER — ONDANSETRON HCL 4 MG/2ML IJ SOLN: 4.0000 mg | Freq: Four times a day (QID) | INTRAMUSCULAR | Status: DC | PRN

## 2013-05-30 MED ORDER — LEVOTHYROXINE SODIUM 100 MCG PO TABS
100.0000 ug | ORAL_TABLET | Freq: Every day | ORAL | Status: DC
  Administered 2013-05-30 – 2013-06-01 (×3): 100 ug via ORAL
  Filled 2013-05-30 (×3): qty 1

## 2013-05-30 MED ORDER — METRONIDAZOLE IN NACL 5-0.79 MG/ML-% IV SOLN
500.0000 mg | Freq: Three times a day (TID) | INTRAVENOUS | Status: DC
  Administered 2013-05-30 – 2013-06-01 (×7): 500 mg via INTRAVENOUS
  Filled 2013-05-30 (×8): qty 100

## 2013-05-30 MED ORDER — ACETAMINOPHEN 325 MG PO TABS: 650.0000 mg | ORAL_TABLET | Freq: Four times a day (QID) | ORAL | Status: DC | PRN

## 2013-05-30 MED ORDER — PANTOPRAZOLE SODIUM 40 MG PO TBEC
40.0000 mg | DELAYED_RELEASE_TABLET | Freq: Every day | ORAL | Status: DC
  Administered 2013-05-31 – 2013-06-01 (×2): 40 mg via ORAL
  Filled 2013-05-30 (×2): qty 1

## 2013-05-30 MED ORDER — PANTOPRAZOLE SODIUM 40 MG IV SOLR: 40.0000 mg | Freq: Two times a day (BID) | INTRAVENOUS | Status: DC

## 2013-05-30 MED ORDER — ONDANSETRON HCL 4 MG PO TABS: 4.0000 mg | ORAL_TABLET | Freq: Four times a day (QID) | ORAL | Status: DC | PRN

## 2013-05-30 MED ORDER — PANTOPRAZOLE SODIUM 40 MG IV SOLR
40.0000 mg | Freq: Two times a day (BID) | INTRAVENOUS | Status: DC
  Administered 2013-05-30 (×2): 40 mg via INTRAVENOUS
  Filled 2013-05-30 (×2): qty 40

## 2013-05-30 NOTE — Progress Notes (Signed)
Subjective: She says her stomach feels better. She has no new complaints. Her diarrhea has improved.  Objective: Vital signs in last 24 hours: Temp:  [97.5 F (36.4 C)-98.3 F (36.8 C)] 98.3 F (36.8 C) (11/08 0631) Pulse Rate:  [74-94] 79 (11/08 0631) Resp:  [18-20] 18 (11/08 0631) BP: (90-128)/(47-78) 90/47 mmHg (11/08 0631) SpO2:  [96 %-100 %] 97 % (11/08 0631) Weight:  [50.7 kg (111 lb 12.4 oz)] 50.7 kg (111 lb 12.4 oz) (11/08 0048) Weight change:  Last BM Date: 05/30/13  Intake/Output from previous day: 11/07 0701 - 11/08 0700 In: -  Out: 2 [Stool:2]  PHYSICAL EXAM General appearance: alert, cooperative, mild distress and Mildly confused Resp: clear to auscultation bilaterally Cardio: regular rate and rhythm, S1, S2 normal, no murmur, click, rub or gallop GI: She has some minimal abdominal tenderness now. Bowel sounds are somewhat hyperactive Extremities: extremities normal, atraumatic, no cyanosis or edema  Lab Results:    Basic Metabolic Panel:  Recent Labs  11/91/47 1733 05/30/13 0710  NA 138 135  K 4.8 4.2  CL 102 106  CO2 25 22  GLUCOSE 124* 95  BUN 35* 26*  CREATININE 1.16* 1.07  CALCIUM 9.3 7.8*   Liver Function Tests:  Recent Labs  05/29/13 1733 05/30/13 0710  AST 21 17  ALT 12 9  ALKPHOS 86 64  BILITOT 0.6 0.8  PROT 6.8 5.2*  ALBUMIN 2.9* 2.3*   No results found for this basename: LIPASE, AMYLASE,  in the last 72 hours No results found for this basename: AMMONIA,  in the last 72 hours CBC:  Recent Labs  05/29/13 1733 05/30/13 0710  WBC 8.4 4.7  NEUTROABS 6.1  --   HGB 11.4* 9.7*  HCT 35.1* 29.8*  MCV 83.0 82.5  PLT 190 166   Cardiac Enzymes: No results found for this basename: CKTOTAL, CKMB, CKMBINDEX, TROPONINI,  in the last 72 hours BNP: No results found for this basename: PROBNP,  in the last 72 hours D-Dimer: No results found for this basename: DDIMER,  in the last 72 hours CBG: No results found for this basename:  GLUCAP,  in the last 72 hours Hemoglobin A1C: No results found for this basename: HGBA1C,  in the last 72 hours Fasting Lipid Panel: No results found for this basename: CHOL, HDL, LDLCALC, TRIG, CHOLHDL, LDLDIRECT,  in the last 72 hours Thyroid Function Tests: No results found for this basename: TSH, T4TOTAL, FREET4, T3FREE, THYROIDAB,  in the last 72 hours Anemia Panel: No results found for this basename: VITAMINB12, FOLATE, FERRITIN, TIBC, IRON, RETICCTPCT,  in the last 72 hours Coagulation: No results found for this basename: LABPROT, INR,  in the last 72 hours Urine Drug Screen: Drugs of Abuse  No results found for this basename: labopia, cocainscrnur, labbenz, amphetmu, thcu, labbarb    Alcohol Level: No results found for this basename: ETH,  in the last 72 hours Urinalysis:  Recent Labs  05/29/13 2043  COLORURINE AMBER*  LABSPEC 1.020  PHURINE 6.0  GLUCOSEU NEGATIVE  HGBUR NEGATIVE  BILIRUBINUR NEGATIVE  KETONESUR NEGATIVE  PROTEINUR NEGATIVE  UROBILINOGEN 0.2  NITRITE NEGATIVE  LEUKOCYTESUR NEGATIVE   Misc. Labs:  ABGS No results found for this basename: PHART, PCO2, PO2ART, TCO2, HCO3,  in the last 72 hours CULTURES No results found for this or any previous visit (from the past 240 hour(s)). Studies/Results: Ct Abdomen Pelvis W Contrast  05/30/2013   ADDENDUM REPORT: 05/30/2013 00:36  ADDENDUM: Note that the report should also include:  The patient is status post hysterectomy; the line of the report regarding the prostate should be disregarded. No suspicious adnexal masses are seen.   Electronically Signed   By: Roanna Raider M.D.   On: 05/30/2013 00:36   05/30/2013   CLINICAL DATA:  Hematuria; diarrhea. Lower abdominal pain and vomiting.  EXAM: CT ABDOMEN AND PELVIS WITH CONTRAST  TECHNIQUE: Multidetector CT imaging of the abdomen and pelvis was performed using the standard protocol following bolus administration of intravenous contrast.  CONTRAST:  100 mL of  Omnipaque 300 IV contrast  COMPARISON:  CT of the abdomen and pelvis performed 04/15/2013  FINDINGS: Mild bibasilar atelectasis or scarring is noted. The patient is status post median sternotomy. A moderate to large hiatal hernia is seen, partially filled with contrast.  The liver and spleen are unremarkable in appearance. The gallbladder is within normal limits. The pancreas and adrenal glands are unremarkable.  Bilateral renal atrophy is noted, more prominent on the left, with areas of renal scarring. There is no evidence of hydronephrosis. No renal or ureteral stones are seen. No perinephric stranding is appreciated.  No free fluid is identified. The small bowel is unremarkable in appearance. The stomach is within normal limits. No acute vascular abnormalities are seen. Scattered calcification is noted along the abdominal aorta and its branches.  The patient is status post appendectomy.  Note is made of diffuse colonic wall thickening involving the distal transverse and descending colon, with surrounding soft tissue inflammation, compatible with colitis. This is most likely infectious in nature, though an inflammatory process might have a similar appearance. There is no definite evidence for ischemia. No significant free fluid is seen; there is no evidence of abscess formation. The remainder of the colon is unremarkable in appearance. Contrast progresses to the distal sigmoid colon.  The bladder is moderately distended and grossly unremarkable in appearance. The prostate remains normal in size. No inguinal lymphadenopathy is seen.  No acute osseous abnormalities are identified.  IMPRESSION: 1. Diffuse colitis involving the distal transverse and descending colon, with surrounding soft tissue inflammation. This is most likely infectious in nature, though an inflammatory process might have a similar appearance. 2. Bilateral renal atrophy, more prominent on the left, with areas of renal scarring. 3. Moderate to large  hiatal hernia, partially filled with contrast. 4. Mild bibasilar atelectasis or scarring noted. 5. Scattered calcification along the abdominal aorta and its branches.  Electronically Signed: By: Roanna Raider M.D. On: 05/29/2013 23:30    Medications:  Scheduled: . ciprofloxacin  400 mg Intravenous Q12H  . levothyroxine  100 mcg Oral QAC breakfast  . metronidazole  500 mg Intravenous Once  . metronidazole  500 mg Intravenous Q8H  . pantoprazole (PROTONIX) IV  40 mg Intravenous Q12H   Continuous: . sodium chloride 75 mL/hr at 05/30/13 0230   WUJ:[id number], acetaminophen, morphine injection, ondansetron (ZOFRAN) IV, ondansetron  Assesment: She was admitted with acute colitis and seems to have improved. She has had some bleeding. She has hypertension and is well controlled now. When she came to the emergency department she was hypotensive and that has responded to IV fluids. Principal Problem:   Acute colitis Active Problems:   Hematochezia   HTN (hypertension), benign   Hypothyroidism   Hypotension, unspecified   ARF (acute renal failure)   Anemia due to acute blood loss    Plan: I will ask for GI consultation    LOS: 1 day   Rosina Cressler L 05/30/2013, 9:37 AM

## 2013-05-30 NOTE — Consult Note (Signed)
Referring Provider: No ref. provider found Primary Care Physician:  Fredirick Maudlin, MD Primary Gastroenterologist:  DR. Jena Gauss  Reason for Consultation:  COLITIS  HPI:  LAST SEEN AND EVALUATED FEB 2014. PMHx: ISCHEMIC COLITIS DEC 2013. DECLINED TCS FEB 2014. PT ADMITTED WITH ABDOMINAL APIN AND DIARRHEA. UNABLE TO GIVE ADDITIONAL HISTORY. PT DOES NOT ANSWERS QUESTIONS. SHE IS CONCERNED ABOUT ITCHING IN HER RIGHT ARM/IV. SHE STATES HER ABDOMINAL PAIN IS BETTER.   Past Medical History  Diagnosis Date  . Thyroid goiter     s/p surgery now on Synthroid  . B12 deficiency   . Coronary artery disease   . Zenker diverticulum   . Hyperlipidemia   . Hypertension   . Osteoporosis   . GERD (gastroesophageal reflux disease)   . Hypercholesterolemia   . Hypothyroidism     Past Surgical History  Procedure Laterality Date  . Esophagogastroduodenoscopy  6/10     Schatzki's ring, otherwise endoscopically normal esophagus, status post dilation (85F), with mucosal disruption through the cervical segment as well as the Schatzki's ring. Hiatal hernia. segment as well as the Schatzki's ring. Hiatal hernia.  . Coronary artery bypass graft      Status Post four-vessel   . Bunionectomy    . Appendectomy    . Abdominal hysterectomy    . Esophagogastroduodenoscopy  09/02/2007    Cervical esophageal web, Schatzki's ring, status post  dilation and disruption as described above. Otherwise, normal esophagus  . Esophagogastroduodenoscopy  August 2011    Zenker's diverticulum, Schatzki's ring s/p dilation, mod to large HH, instructions to pursue BPE if recurrent dysphagia to assess Zenker's  . Esophageal dilation    . Esophagogastroduodenoscopy  03/12/12    Rourk-dilated 45F, cervical web, Schatzki's ring, small HH, small Zenker's diverticulum    Prior to Admission medications   Medication Sig Start Date End Date Taking? Authorizing Provider  amLODipine (NORVASC) 5 MG tablet Take 5 mg by mouth daily  after breakfast.  12/10/11  Yes Historical Provider, MD  aspirin 325 MG tablet Take 325 mg by mouth every morning.    Yes Historical Provider, MD  atorvastatin (LIPITOR) 10 MG tablet Take 10 mg by mouth every morning.  12/10/11  Yes Historical Provider, MD  levothyroxine (SYNTHROID, LEVOTHROID) 100 MCG tablet Take 100 mcg by mouth daily before breakfast.   Yes Historical Provider, MD  quinapril (ACCUPRIL) 20 MG tablet Take 20 mg by mouth daily after breakfast.  12/10/11  Yes Historical Provider, MD    Current Facility-Administered Medications  Medication Dose Route Frequency Provider Last Rate Last Dose  .         Marland Kitchen acetaminophen (TYLENOL) tablet 650 mg  650 mg Oral Q6H PRN Osvaldo Shipper, MD       Or  . acetaminophen (TYLENOL) suppository 650 mg  650 mg Rectal Q6H PRN Osvaldo Shipper, MD      . ciprofloxacin (CIPRO) IVPB 400 mg  400 mg Intravenous Q12H Osvaldo Shipper, MD   400 mg at 05/30/13 1045  . levothyroxine (SYNTHROID, LEVOTHROID) tablet 100 mcg  100 mcg Oral QAC breakfast Osvaldo Shipper, MD   100 mcg at 05/30/13 0931  . metroNIDAZOLE (FLAGYL) IVPB 500 mg  500 mg Intravenous Once Benny Lennert, MD      . metroNIDAZOLE (FLAGYL) IVPB 500 mg  500 mg Intravenous Q8H Osvaldo Shipper, MD   500 mg at 05/30/13 0931                                .  pantoprazole (PROTONIX) injection 40 mg  40 mg Intravenous Q12H Osvaldo Shipper, MD   40 mg at 05/30/13 0931    Allergies as of 05/29/2013  . (No Known Allergies)    Family History:  Colon Cancer  negative                         History   Social History  . Marital Status: Widowed    Spouse Name: N/A    Number of Children: N/A  . Years of Education: N/A   Occupational History  . retired    Social History Main Topics  . Smoking status: Never Smoker   . Smokeless tobacco: Never Used  . Alcohol Use: No  . Drug Use: No  . Sexual Activity: Not Currently   Other Topics Concern  . Not on file   Social History Narrative  . No  narrative on file    Review of Systems: PER HPI OTHERWISE ALL SYSTEMS ARE NEGATIVE. LIMITED DUE TO COGNITIVE DYSFUNCTION  Vitals: Blood pressure 90/47, pulse 79, temperature 98.3 F (36.8 C), temperature source Oral, resp. rate 18, height 5\' 3"  (1.6 m), weight 111 lb 12.4 oz (50.7 kg), SpO2 97.00%.  Physical Exam: General:   Alert,  pleasant and cooperative in NAD Head:  Normocephalic and atraumatic. Eyes:   Conjunctiva pink. Mouth:  No lesions Neck:  Supple;. Lungs:  Clear throughout to auscultation.   No wheezes. No acute distress. Heart:  Regular rate and rhythm; no murmurs Abdomen:  Soft, nontender and nondistended. No masses noted. Normal bowel sounds, without guarding, and without rebound.   Extremities:  With  Edema in RIGHT ANTECUBITAL REGION  AT SITE OF IV. Neurologic:  Alert and  oriented x4;  grossly normal neurologically. Cervical Nodes:  No significant cervical adenopathy. Psych:  Alert and cooperative. Normal mood and affect.  Lab Results:  Recent Labs  05/29/13 1733 05/30/13 0710  WBC 8.4 4.7  HGB 11.4* 9.7*  HCT 35.1* 29.8*  PLT 190 166   BMET  Recent Labs  05/29/13 1733 05/30/13 0710  NA 138 135  K 4.8 4.2  CL 102 106  CO2 25 22  GLUCOSE 124* 95  BUN 35* 26*  CREATININE 1.16* 1.07  CALCIUM 9.3 7.8*   LFT  Recent Labs  05/30/13 0710  PROT 5.2*  ALBUMIN 2.3*  AST 17  ALT 9  ALKPHOS 64  BILITOT 0.8    Studies/Results: NOV 8-Ct abd/pelvic w/ ivc LARGE HIATAL HERNIA, DISTAL COLITIS  Impression: ADMITTED WITH ACUTE COLITIS-INFECTIOUS V. ISCHEMIC. CLINICALLY IMPROVED.  Plan: 1. CIP/FLAG FOR 10 DAYS 2. SOFT DIET 3. PROTONIX DAILY 4. AWAIT GI STOOL PATHOGEN PANEL 5. SUPPORTIVE CARE     LOS: 1 day   [facility]  05/30/2013, 11:46 AM

## 2013-05-31 LAB — CBC WITH DIFFERENTIAL/PLATELET
Basophils Relative: 1 % (ref 0–1)
Eosinophils Relative: 3 % (ref 0–5)
HCT: 29.3 % — ABNORMAL LOW (ref 36.0–46.0)
Hemoglobin: 9.4 g/dL — ABNORMAL LOW (ref 12.0–15.0)
Lymphocytes Relative: 40 % (ref 12–46)
Lymphs Abs: 1.7 10*3/uL (ref 0.7–4.0)
MCHC: 32.1 g/dL (ref 30.0–36.0)
MCV: 83.5 fL (ref 78.0–100.0)
Monocytes Absolute: 0.5 10*3/uL (ref 0.1–1.0)
RBC: 3.51 MIL/uL — ABNORMAL LOW (ref 3.87–5.11)
WBC: 4.1 10*3/uL (ref 4.0–10.5)

## 2013-05-31 MED ORDER — SODIUM CHLORIDE 0.9 % IV SOLN
INTRAVENOUS | Status: DC
  Administered 2013-05-31: 1000 mL via INTRAVENOUS
  Administered 2013-06-01: 07:00:00 via INTRAVENOUS

## 2013-05-31 NOTE — Progress Notes (Signed)
Subjective: She feels better. She's having less diarrhea. She has no other new complaints.  Objective: Vital signs in last 24 hours: Temp:  [98.3 F (36.8 C)-98.6 F (37 C)] 98.6 F (37 C) (11/08 2115) Pulse Rate:  [68-80] 68 (11/08 2115) Resp:  [18] 18 (11/08 2115) BP: (102-124)/(51-55) 124/55 mmHg (11/08 2115) SpO2:  [96 %-100 %] 96 % (11/08 2115) Weight change:  Last BM Date: 05/30/13  Intake/Output from previous day: 11/08 0701 - 11/09 0700 In: 300 [P.O.:300] Out: 410 [Urine:404; Stool:6]  PHYSICAL EXAM General appearance: alert, cooperative and no distress Resp: clear to auscultation bilaterally Cardio: regular rate and rhythm, S1, S2 normal, no murmur, click, rub or gallop GI: Minimally tender with hyperactive bowel sounds Extremities: extremities normal, atraumatic, no cyanosis or edema  Lab Results:    Basic Metabolic Panel:  Recent Labs  16/10/96 1733 05/30/13 0710  NA 138 135  K 4.8 4.2  CL 102 106  CO2 25 22  GLUCOSE 124* 95  BUN 35* 26*  CREATININE 1.16* 1.07  CALCIUM 9.3 7.8*   Liver Function Tests:  Recent Labs  05/29/13 1733 05/30/13 0710  AST 21 17  ALT 12 9  ALKPHOS 86 64  BILITOT 0.6 0.8  PROT 6.8 5.2*  ALBUMIN 2.9* 2.3*   No results found for this basename: LIPASE, AMYLASE,  in the last 72 hours No results found for this basename: AMMONIA,  in the last 72 hours CBC:  Recent Labs  05/29/13 1733 05/30/13 0710 05/31/13 0627  WBC 8.4 4.7 4.1  NEUTROABS 6.1  --  1.8  HGB 11.4* 9.7* 9.4*  HCT 35.1* 29.8* 29.3*  MCV 83.0 82.5 83.5  PLT 190 166 143*   Cardiac Enzymes: No results found for this basename: CKTOTAL, CKMB, CKMBINDEX, TROPONINI,  in the last 72 hours BNP: No results found for this basename: PROBNP,  in the last 72 hours D-Dimer: No results found for this basename: DDIMER,  in the last 72 hours CBG: No results found for this basename: GLUCAP,  in the last 72 hours Hemoglobin A1C: No results found for this  basename: HGBA1C,  in the last 72 hours Fasting Lipid Panel: No results found for this basename: CHOL, HDL, LDLCALC, TRIG, CHOLHDL, LDLDIRECT,  in the last 72 hours Thyroid Function Tests: No results found for this basename: TSH, T4TOTAL, FREET4, T3FREE, THYROIDAB,  in the last 72 hours Anemia Panel: No results found for this basename: VITAMINB12, FOLATE, FERRITIN, TIBC, IRON, RETICCTPCT,  in the last 72 hours Coagulation: No results found for this basename: LABPROT, INR,  in the last 72 hours Urine Drug Screen: Drugs of Abuse  No results found for this basename: labopia, cocainscrnur, labbenz, amphetmu, thcu, labbarb    Alcohol Level: No results found for this basename: ETH,  in the last 72 hours Urinalysis:  Recent Labs  05/29/13 2043  COLORURINE AMBER*  LABSPEC 1.020  PHURINE 6.0  GLUCOSEU NEGATIVE  HGBUR NEGATIVE  BILIRUBINUR NEGATIVE  KETONESUR NEGATIVE  PROTEINUR NEGATIVE  UROBILINOGEN 0.2  NITRITE NEGATIVE  LEUKOCYTESUR NEGATIVE   Misc. Labs:  ABGS No results found for this basename: PHART, PCO2, PO2ART, TCO2, HCO3,  in the last 72 hours CULTURES No results found for this or any previous visit (from the past 240 hour(s)). Studies/Results: Ct Abdomen Pelvis W Contrast  05/30/2013   ADDENDUM REPORT: 05/30/2013 00:36  ADDENDUM: Note that the report should also include: The patient is status post hysterectomy; the line of the report regarding the prostate should be disregarded.  No suspicious adnexal masses are seen.   Electronically Signed   By: Roanna Raider M.D.   On: 05/30/2013 00:36   05/30/2013   CLINICAL DATA:  Hematuria; diarrhea. Lower abdominal pain and vomiting.  EXAM: CT ABDOMEN AND PELVIS WITH CONTRAST  TECHNIQUE: Multidetector CT imaging of the abdomen and pelvis was performed using the standard protocol following bolus administration of intravenous contrast.  CONTRAST:  100 mL of Omnipaque 300 IV contrast  COMPARISON:  CT of the abdomen and pelvis performed  04/15/2013  FINDINGS: Mild bibasilar atelectasis or scarring is noted. The patient is status post median sternotomy. A moderate to large hiatal hernia is seen, partially filled with contrast.  The liver and spleen are unremarkable in appearance. The gallbladder is within normal limits. The pancreas and adrenal glands are unremarkable.  Bilateral renal atrophy is noted, more prominent on the left, with areas of renal scarring. There is no evidence of hydronephrosis. No renal or ureteral stones are seen. No perinephric stranding is appreciated.  No free fluid is identified. The small bowel is unremarkable in appearance. The stomach is within normal limits. No acute vascular abnormalities are seen. Scattered calcification is noted along the abdominal aorta and its branches.  The patient is status post appendectomy.  Note is made of diffuse colonic wall thickening involving the distal transverse and descending colon, with surrounding soft tissue inflammation, compatible with colitis. This is most likely infectious in nature, though an inflammatory process might have a similar appearance. There is no definite evidence for ischemia. No significant free fluid is seen; there is no evidence of abscess formation. The remainder of the colon is unremarkable in appearance. Contrast progresses to the distal sigmoid colon.  The bladder is moderately distended and grossly unremarkable in appearance. The prostate remains normal in size. No inguinal lymphadenopathy is seen.  No acute osseous abnormalities are identified.  IMPRESSION: 1. Diffuse colitis involving the distal transverse and descending colon, with surrounding soft tissue inflammation. This is most likely infectious in nature, though an inflammatory process might have a similar appearance. 2. Bilateral renal atrophy, more prominent on the left, with areas of renal scarring. 3. Moderate to large hiatal hernia, partially filled with contrast. 4. Mild bibasilar atelectasis  or scarring noted. 5. Scattered calcification along the abdominal aorta and its branches.  Electronically Signed: By: Roanna Raider M.D. On: 05/29/2013 23:30    Medications:  Prior to Admission:  Prescriptions prior to admission  Medication Sig Dispense Refill  . amLODipine (NORVASC) 5 MG tablet Take 5 mg by mouth daily after breakfast.       . aspirin 325 MG tablet Take 325 mg by mouth every morning.       Marland Kitchen atorvastatin (LIPITOR) 10 MG tablet Take 10 mg by mouth every morning.       Marland Kitchen levothyroxine (SYNTHROID, LEVOTHROID) 100 MCG tablet Take 100 mcg by mouth daily before breakfast.      . quinapril (ACCUPRIL) 20 MG tablet Take 20 mg by mouth daily after breakfast.        Scheduled: . ciprofloxacin  400 mg Intravenous Q12H  . levothyroxine  100 mcg Oral QAC breakfast  . metronidazole  500 mg Intravenous Once  . metronidazole  500 mg Intravenous Q8H  . pantoprazole  40 mg Oral QAC breakfast   Continuous: . sodium chloride 1,000 mL (05/31/13 0102)   FAO:ZHYQMVHQIONGE, acetaminophen, morphine injection, ondansetron (ZOFRAN) IV, ondansetron  Assesment: She was admitted with acute colitis. She is anemic probably related to  hematochezia. She was hypotensive and that has resolved. She had acute renal failure and that is much improved. Her enteric pathogen panel is still pending Principal Problem:   Acute colitis Active Problems:   Hematochezia   HTN (hypertension), benign   Hypothyroidism   Hypotension, unspecified   ARF (acute renal failure)   Anemia due to acute blood loss    Plan: Continue current treatments.    LOS: 2 days   Anikah Hogge L 05/31/2013, 10:30 AM

## 2013-05-31 NOTE — Progress Notes (Signed)
Subjective: Since I last evaluated the patient HER BLOODY DIARRHEA HAS RESOLVED. TOLERATING POS. RIGHT ARM BETTER. NO NAUSEA OR VOMITING.  Objective: Vital signs in last 24 hours: Temp:  [98.3 F (36.8 C)-98.6 F (37 C)] 98.6 F (37 C) (11/08 2115) Pulse Rate:  [68-80] 68 (11/08 2115) Resp:  [18] 18 (11/08 2115) BP: (102-124)/(51-55) 124/55 mmHg (11/08 2115) SpO2:  [96 %-100 %] 96 % (11/08 2115) Last BM Date: 05/30/13  Intake/Output from previous day: 11/08 0701 - 11/09 0700 In: 300 [P.O.:300] Out: 410 [Urine:404; Stool:6] Intake/Output this shift:    General appearance: alert, cooperative and no distress Resp: clear to auscultation bilaterally Cardio: regular rate and rhythm GI: soft, non-tender; bowel sounds normal;  EXTREMITY: R ANTECUBITAL REGION-MILD ERYTHEMA AND NO EDEMA.  Lab Results:  Recent Labs  05/29/13 1733 05/30/13 0710 05/31/13 0627  WBC 8.4 4.7 4.1  HGB 11.4* 9.7* 9.4*  HCT 35.1* 29.8* 29.3*  PLT 190 166 143*   BMET  Recent Labs  05/29/13 1733 05/30/13 0710  NA 138 135  K 4.8 4.2  CL 102 106  CO2 25 22  GLUCOSE 124* 95  BUN 35* 26*  CREATININE 1.16* 1.07  CALCIUM 9.3 7.8*   LFT  Recent Labs  05/30/13 0710  PROT 5.2*  ALBUMIN 2.3*  AST 17  ALT 9  ALKPHOS 64  BILITOT 0.8   Studies/Results: Ct Abdomen Pelvis W Contrast  05/30/2013   ADDENDUM REPORT: 05/30/2013 00:36  ADDENDUM: Note that the report should also include: The patient is status post hysterectomy; the line of the report regarding the prostate should be disregarded. No suspicious adnexal masses are seen.   Electronically Signed   By: Roanna Raider M.D.   On: 05/30/2013 00:36   05/30/2013   CLINICAL DATA:  Hematuria; diarrhea. Lower abdominal pain and vomiting.  EXAM: CT ABDOMEN AND PELVIS WITH CONTRAST  TECHNIQUE: Multidetector CT imaging of the abdomen and pelvis was performed using the standard protocol following bolus administration of intravenous contrast.  CONTRAST:   100 mL of Omnipaque 300 IV contrast  COMPARISON:  CT of the abdomen and pelvis performed 04/15/2013  FINDINGS: Mild bibasilar atelectasis or scarring is noted. The patient is status post median sternotomy. A moderate to large hiatal hernia is seen, partially filled with contrast.  The liver and spleen are unremarkable in appearance. The gallbladder is within normal limits. The pancreas and adrenal glands are unremarkable.  Bilateral renal atrophy is noted, more prominent on the left, with areas of renal scarring. There is no evidence of hydronephrosis. No renal or ureteral stones are seen. No perinephric stranding is appreciated.  No free fluid is identified. The small bowel is unremarkable in appearance. The stomach is within normal limits. No acute vascular abnormalities are seen. Scattered calcification is noted along the abdominal aorta and its branches.  The patient is status post appendectomy.  Note is made of diffuse colonic wall thickening involving the distal transverse and descending colon, with surrounding soft tissue inflammation, compatible with colitis. This is most likely infectious in nature, though an inflammatory process might have a similar appearance. There is no definite evidence for ischemia. No significant free fluid is seen; there is no evidence of abscess formation. The remainder of the colon is unremarkable in appearance. Contrast progresses to the distal sigmoid colon.  The bladder is moderately distended and grossly unremarkable in appearance. The prostate remains normal in size. No inguinal lymphadenopathy is seen.  No acute osseous abnormalities are identified.  IMPRESSION: 1.  Diffuse colitis involving the distal transverse and descending colon, with surrounding soft tissue inflammation. This is most likely infectious in nature, though an inflammatory process might have a similar appearance. 2. Bilateral renal atrophy, more prominent on the left, with areas of renal scarring. 3.  Moderate to large hiatal hernia, partially filled with contrast. 4. Mild bibasilar atelectasis or scarring noted. 5. Scattered calcification along the abdominal aorta and its branches.  Electronically Signed: By: Roanna Raider M.D. On: 05/29/2013 23:30    Medications: I have reviewed the patient's current medications.  Assessment/Plan: ADMITTED WITH COLITIS-INFECTIOUS V. ISCHEMIC. CLINICALLY IMPROVED.  PLAN: 1. COMPLETE ABX-10 DAY COURSE 2. SUPPORTIVE CARE  3. AWAIT FINAL GI PATH PANEL   LOS: 2 days   Sandi Fields 05/31/2013, 10:19 AM

## 2013-06-01 DIAGNOSIS — A0472 Enterocolitis due to Clostridium difficile, not specified as recurrent: Secondary | ICD-10-CM

## 2013-06-01 LAB — GI PATHOGEN PANEL BY PCR, STOOL
Cryptosporidium by PCR: NEGATIVE
E coli (ETEC) LT/ST: NEGATIVE
E coli 0157 by PCR: NEGATIVE
Norovirus GI/GII: NEGATIVE
Salmonella by PCR: NEGATIVE
Shigella by PCR: NEGATIVE

## 2013-06-01 MED ORDER — METRONIDAZOLE 500 MG PO TABS: 500.0000 mg | ORAL_TABLET | Freq: Three times a day (TID) | ORAL | Status: DC

## 2013-06-01 MED ORDER — METRONIDAZOLE 500 MG PO TABS
500.0000 mg | ORAL_TABLET | Freq: Three times a day (TID) | ORAL | Status: DC
  Administered 2013-06-01: 500 mg via ORAL
  Filled 2013-06-01: qty 1

## 2013-06-01 MED ORDER — SACCHAROMYCES BOULARDII 250 MG PO CAPS: 250.0000 mg | ORAL_CAPSULE | Freq: Two times a day (BID) | ORAL | Status: DC

## 2013-06-01 MED ORDER — SACCHAROMYCES BOULARDII 250 MG PO CAPS
250.0000 mg | ORAL_CAPSULE | Freq: Two times a day (BID) | ORAL | Status: DC
  Administered 2013-06-01: 250 mg via ORAL
  Filled 2013-06-01: qty 1

## 2013-06-01 NOTE — Progress Notes (Signed)
Subjective: She says she feels much better. She has no new complaints. Her breathing is okay. Her diarrhea has resolved. Her enteric pathogen panel shows she is positive for C. difficile.  Objective: Vital signs in last 24 hours: Temp:  [97 F (36.1 C)-98.1 F (36.7 C)] 98 F (36.7 C) (11/10 0617) Pulse Rate:  [67-75] 69 (11/10 0617) Resp:  [18-20] 18 (11/10 0617) BP: (115-137)/(60-99) 115/99 mmHg (11/10 0617) SpO2:  [99 %-100 %] 100 % (11/10 0617) Weight change:  Last BM Date: 05/31/13  Intake/Output from previous day: 11/09 0701 - 11/10 0700 In: 1175 [P.O.:360; I.V.:815] Out: 900 [Urine:900]  PHYSICAL EXAM General appearance: alert, cooperative and no distress Resp: clear to auscultation bilaterally Cardio: regular rate and rhythm, S1, S2 normal, no murmur, click, rub or gallop GI: No abdominal tenderness now. Extremities: extremities normal, atraumatic, no cyanosis or edema  Lab Results:    Basic Metabolic Panel:  Recent Labs  96/04/54 1733 05/30/13 0710  NA 138 135  K 4.8 4.2  CL 102 106  CO2 25 22  GLUCOSE 124* 95  BUN 35* 26*  CREATININE 1.16* 1.07  CALCIUM 9.3 7.8*   Liver Function Tests:  Recent Labs  05/29/13 1733 05/30/13 0710  AST 21 17  ALT 12 9  ALKPHOS 86 64  BILITOT 0.6 0.8  PROT 6.8 5.2*  ALBUMIN 2.9* 2.3*   No results found for this basename: LIPASE, AMYLASE,  in the last 72 hours No results found for this basename: AMMONIA,  in the last 72 hours CBC:  Recent Labs  05/29/13 1733 05/30/13 0710 05/31/13 0627  WBC 8.4 4.7 4.1  NEUTROABS 6.1  --  1.8  HGB 11.4* 9.7* 9.4*  HCT 35.1* 29.8* 29.3*  MCV 83.0 82.5 83.5  PLT 190 166 143*   Cardiac Enzymes: No results found for this basename: CKTOTAL, CKMB, CKMBINDEX, TROPONINI,  in the last 72 hours BNP: No results found for this basename: PROBNP,  in the last 72 hours D-Dimer: No results found for this basename: DDIMER,  in the last 72 hours CBG: No results found for this  basename: GLUCAP,  in the last 72 hours Hemoglobin A1C: No results found for this basename: HGBA1C,  in the last 72 hours Fasting Lipid Panel: No results found for this basename: CHOL, HDL, LDLCALC, TRIG, CHOLHDL, LDLDIRECT,  in the last 72 hours Thyroid Function Tests: No results found for this basename: TSH, T4TOTAL, FREET4, T3FREE, THYROIDAB,  in the last 72 hours Anemia Panel: No results found for this basename: VITAMINB12, FOLATE, FERRITIN, TIBC, IRON, RETICCTPCT,  in the last 72 hours Coagulation: No results found for this basename: LABPROT, INR,  in the last 72 hours Urine Drug Screen: Drugs of Abuse  No results found for this basename: labopia, cocainscrnur, labbenz, amphetmu, thcu, labbarb    Alcohol Level: No results found for this basename: ETH,  in the last 72 hours Urinalysis:  Recent Labs  05/29/13 2043  COLORURINE AMBER*  LABSPEC 1.020  PHURINE 6.0  GLUCOSEU NEGATIVE  HGBUR NEGATIVE  BILIRUBINUR NEGATIVE  KETONESUR NEGATIVE  PROTEINUR NEGATIVE  UROBILINOGEN 0.2  NITRITE NEGATIVE  LEUKOCYTESUR NEGATIVE   Misc. Labs:  ABGS No results found for this basename: PHART, PCO2, PO2ART, TCO2, HCO3,  in the last 72 hours CULTURES No results found for this or any previous visit (from the past 240 hour(s)). Studies/Results: No results found.  Medications:  Prior to Admission:  Prescriptions prior to admission  Medication Sig Dispense Refill  . amLODipine (NORVASC) 5  MG tablet Take 5 mg by mouth daily after breakfast.       . aspirin 325 MG tablet Take 325 mg by mouth every morning.       Marland Kitchen atorvastatin (LIPITOR) 10 MG tablet Take 10 mg by mouth every morning.       Marland Kitchen levothyroxine (SYNTHROID, LEVOTHROID) 100 MCG tablet Take 100 mcg by mouth daily before breakfast.      . quinapril (ACCUPRIL) 20 MG tablet Take 20 mg by mouth daily after breakfast.        Scheduled: . levothyroxine  100 mcg Oral QAC breakfast  . metroNIDAZOLE  500 mg Oral Q8H  . pantoprazole   40 mg Oral QAC breakfast  . saccharomyces boulardii  250 mg Oral BID   Continuous: . sodium chloride 50 mL/hr at 06/01/13 0981   XBJ:[id number], acetaminophen, morphine injection, ondansetron (ZOFRAN) IV, ondansetron  Assesment: She has C. difficile colitis and is much improved. Her diarrhea has resolved. She had acute renal failure which I think was due to dehydration. She is anemic and some of that is due to blood loss I think some of it is from chronic disease Principal Problem:   Acute colitis Active Problems:   Hematochezia   HTN (hypertension), benign   Hypothyroidism   Hypotension, unspecified   ARF (acute renal failure)   Anemia due to acute blood loss    Plan: Discharge home today    LOS: 3 days   Tiney Zipper L 06/01/2013, 8:57 AM

## 2013-06-01 NOTE — Progress Notes (Signed)
Subjective: No rectal bleeding. Eating breakfast. Continues to ask why she had diarrhea but easily distracted and talking about breakfast. Unable to obtain complete information from her.   Objective: Vital signs in last 24 hours: Temp:  [97 F (36.1 C)-98.1 F (36.7 C)] 98 F (36.7 C) (11/10 0617) Pulse Rate:  [67-75] 69 (11/10 0617) Resp:  [18-20] 18 (11/10 0617) BP: (115-137)/(60-99) 115/99 mmHg (11/10 0617) SpO2:  [99 %-100 %] 100 % (11/10 0617) Last BM Date: 05/31/13 General:   Alert and oriented to person only Heart:  S1, S2 present, no murmurs noted.  Lungs: Clear to auscultation bilaterally, without wheezing, rales, or rhonchi.  Abdomen:  Bowel sounds present, soft, non-tender, non-distended. No HSM or hernias noted. No rebound or guarding. No masses appreciated  Psych:  Alert and cooperative. Normal mood and affect.  Intake/Output from previous day: 11/09 0701 - 11/10 0700 In: 1175 [P.O.:360; I.V.:815] Out: 900 [Urine:900] Intake/Output this shift:    Lab Results:  Recent Labs  05/29/13 1733 05/30/13 0710 05/31/13 0627  WBC 8.4 4.7 4.1  HGB 11.4* 9.7* 9.4*  HCT 35.1* 29.8* 29.3*  PLT 190 166 143*   BMET  Recent Labs  05/29/13 1733 05/30/13 0710  NA 138 135  K 4.8 4.2  CL 102 106  CO2 25 22  GLUCOSE 124* 95  BUN 35* 26*  CREATININE 1.16* 1.07  CALCIUM 9.3 7.8*   LFT  Recent Labs  05/29/13 1733 05/30/13 0710  PROT 6.8 5.2*  ALBUMIN 2.9* 2.3*  AST 21 17  ALT 12 9  ALKPHOS 86 64  BILITOT 0.6 0.8   GI PATHOGEN PANEL: Cdiff Positive   Assessment: 79 year old female admitted with colitis and found to be positive for Cdiff on GI pathogen panel. Clinically improved since admission, with hopeful discharge soon.   Plan: Stop Cipro due to positive Cdiff on GI pathogen panel. Continue Flagyl, convert to oral Add probiotic X 3 months Hopeful discharge soon Outpatient follow-up in our office; we will arrange  Nira Retort, ANP-BC [facility]astro[facility]       LOS: 3 days    06/01/2013, 7:50 AM

## 2013-06-01 NOTE — Progress Notes (Signed)
REVIEWED.  PT NEEDS FLAGYL TID FOR 10 DAYS AS OP.

## 2013-06-01 NOTE — Progress Notes (Signed)
UR chart review completed.  

## 2013-06-01 NOTE — Care Management Note (Signed)
    Page 1 of 1   06/01/2013     10:54:39 AM   CARE MANAGEMENT NOTE 06/01/2013  Patient:  Elizabeth [name], Elizabeth [name]   Account Number:  [id number]  Date Initiated:  06/01/2013  Documentation initiated by:  Sharrie Rothman  Subjective/Objective Assessment:   Pt admitted from home with c diff colitis. Pt lives alone but has a niece that lives next door and pt will be staying with her for a few days. Pt is fairly independent with ADLs' and has a cane for prn use.     Action/Plan:   HH RN arranged with [facility]. Alroy Bailiff of [facility] is aware and will collect the pts information from the chart. HH services to start within 48 hours. Pt and pts nurse aware of discharge arrangements.   Anticipated DC Date:  06/01/2013   Anticipated DC Plan:  HOME W HOME HEALTH SERVICES      DC Planning Services  CM consult      [facility] Choice  HOME HEALTH   Choice offered to / List presented to:  C-1 Patient        HH arranged  HH-1 RN      [facility] agency  [facility]   Status of service:  Completed, signed off Medicare Important Message given?  YES (If response is "NO", the following Medicare IM given date fields will be blank) Date Medicare IM given:  06/01/2013 Date Additional Medicare IM given:    Discharge Disposition:  HOME W HOME HEALTH SERVICES  Per UR Regulation:    If discussed at Long Length of Stay Meetings, dates discussed:    Comments:  06/01/13 1055 Arlyss Queen, RN BSN CM

## 2013-06-03 ENCOUNTER — Telehealth: Payer: Self-pay | Admitting: Gastroenterology

## 2013-06-03 NOTE — Telephone Encounter (Signed)
Recently inpatient with Cdiff. Please have patient return in about 6 weeks for hospital follow-up.

## 2013-06-04 ENCOUNTER — Encounter: Payer: Self-pay | Admitting: Gastroenterology

## 2013-06-04 NOTE — Telephone Encounter (Signed)
Pt is aware of OV on 12/22 at 2 with [facility] and appt card was mailed

## 2013-06-05 NOTE — Discharge Summary (Signed)
Physician Discharge Summary  Patient ID: Elizabeth [name] MRN: [id number] DOB/AGE: 1918/11/28 79 y.o. Primary Care Physician:Reese Stockman L, MD Admit date: 05/29/2013 Discharge date: 06/05/2013    Discharge Diagnoses:   Principal Problem:   Clostridium difficile colitis Active Problems:   Dehydration   Acute colitis   Hematochezia   HTN (hypertension), benign   Hypothyroidism   Hypotension, unspecified   ARF (acute renal failure)   Anemia due to acute blood loss     Medication List         amLODipine 5 MG tablet  Commonly known as:  NORVASC  Take 5 mg by mouth daily after breakfast.     aspirin 325 MG tablet  Take 325 mg by mouth every morning.     atorvastatin 10 MG tablet  Commonly known as:  LIPITOR  Take 10 mg by mouth every morning.     levothyroxine 100 MCG tablet  Commonly known as:  SYNTHROID, LEVOTHROID  Take 100 mcg by mouth daily before breakfast.     metroNIDAZOLE 500 MG tablet  Commonly known as:  FLAGYL  Take 1 tablet (500 mg total) by mouth 3 (three) times daily.     quinapril 20 MG tablet  Commonly known as:  ACCUPRIL  Take 20 mg by mouth daily after breakfast.     saccharomyces boulardii 250 MG capsule  Commonly known as:  FLORASTOR  Take 1 capsule (250 mg total) by mouth 2 (two) times daily.        Discharged Condition: Improved    Consults: Gastroenterology  Significant Diagnostic Studies: Ct Abdomen Pelvis W Contrast  05/30/2013   ADDENDUM REPORT: 05/30/2013 00:36  ADDENDUM: Note that the report should also include: The patient is status post hysterectomy; the line of the report regarding the prostate should be disregarded. No suspicious adnexal masses are seen.   Electronically Signed   By: Roanna Raider M.D.   On: 05/30/2013 00:36   05/30/2013   CLINICAL DATA:  Hematuria; diarrhea. Lower abdominal pain and vomiting.  EXAM: CT ABDOMEN AND PELVIS WITH CONTRAST  TECHNIQUE: Multidetector CT imaging of the abdomen and pelvis was  performed using the standard protocol following bolus administration of intravenous contrast.  CONTRAST:  100 mL of Omnipaque 300 IV contrast  COMPARISON:  CT of the abdomen and pelvis performed 04/15/2013  FINDINGS: Mild bibasilar atelectasis or scarring is noted. The patient is status post median sternotomy. A moderate to large hiatal hernia is seen, partially filled with contrast.  The liver and spleen are unremarkable in appearance. The gallbladder is within normal limits. The pancreas and adrenal glands are unremarkable.  Bilateral renal atrophy is noted, more prominent on the left, with areas of renal scarring. There is no evidence of hydronephrosis. No renal or ureteral stones are seen. No perinephric stranding is appreciated.  No free fluid is identified. The small bowel is unremarkable in appearance. The stomach is within normal limits. No acute vascular abnormalities are seen. Scattered calcification is noted along the abdominal aorta and its branches.  The patient is status post appendectomy.  Note is made of diffuse colonic wall thickening involving the distal transverse and descending colon, with surrounding soft tissue inflammation, compatible with colitis. This is most likely infectious in nature, though an inflammatory process might have a similar appearance. There is no definite evidence for ischemia. No significant free fluid is seen; there is no evidence of abscess formation. The remainder of the colon is unremarkable in appearance. Contrast progresses to the distal sigmoid  colon.  The bladder is moderately distended and grossly unremarkable in appearance. The prostate remains normal in size. No inguinal lymphadenopathy is seen.  No acute osseous abnormalities are identified.  IMPRESSION: 1. Diffuse colitis involving the distal transverse and descending colon, with surrounding soft tissue inflammation. This is most likely infectious in nature, though an inflammatory process might have a similar  appearance. 2. Bilateral renal atrophy, more prominent on the left, with areas of renal scarring. 3. Moderate to large hiatal hernia, partially filled with contrast. 4. Mild bibasilar atelectasis or scarring noted. 5. Scattered calcification along the abdominal aorta and its branches.  Electronically Signed: By: Roanna Raider M.D. On: 05/29/2013 23:30    Lab Results: Basic Metabolic Panel: No results found for this basename: NA, K, CL, CO2, GLUCOSE, BUN, CREATININE, CALCIUM, MG, PHOS,  in the last 72 hours Liver Function Tests: No results found for this basename: AST, ALT, ALKPHOS, BILITOT, PROT, ALBUMIN,  in the last 72 hours   CBC: No results found for this basename: WBC, NEUTROABS, HGB, HCT, MCV, PLT,  in the last 72 hours  No results found for this or any previous visit (from the past 240 hour(s)).   Hospital Course: This is a 79 year old who came to the emergency of abdominal discomfort nausea and diarrhea. CT scan showed what appeared to the clivus. She was started on treatment for colitis which could have been infectious or ischemic. She improved rapidly in the next day said she felt much better. She still had some diarrhea however her abdominal pain was improved but not results. She had GI consultation and since she was improving and it was felt that she did not need any acute intervention at this time. Her stool came back positive for Clostridium difficile. She was being treated with Flagyl and this was changed to by mouth. By the time of discharge she was able to eat had no further nausea no diarrhea and was back at baseline  Discharge Exam: Blood pressure 115/99, pulse 69, temperature 98 F (36.7 C), temperature source Oral, resp. rate 18, height 5\' 3"  (1.6 m), weight 50.7 kg (111 lb 12.4 oz), SpO2 100.00%. She is awake and alert. She is very hard of hearing. Her chest is clear. Her heart is regular. Her abdomen is soft without masses  Disposition: Home with home health services       Discharge Orders   Future Appointments Provider Department Dept Phone   07/13/2013 2:00 PM Nira Retort, NP [facility] [phone]   Future Orders Complete By Expires   Discharge patient  As directed    Face-to-face encounter (required for Medicare/Medicaid patients)  As directed    Comments:     I Briggett Tuccillo L certify that this patient is under my care and that I, or a nurse practitioner or physician's assistant working with me, had a face-to-face encounter that meets the physician face-to-face encounter requirements with this patient on 06/01/2013. The encounter with the patient was in whole, or in part for the following medical condition(s) which is the primary reason for home health care (List medical condition): Clostridium difficile colitis   Questions:     The encounter with the patient was in whole, or in part, for the following medical condition, which is the primary reason for home health care:  Clostridium difficile colitis   I certify that, based on my findings, the following services are medically necessary home health services:  Nursing   My clinical findings support the need for  the above services:  Unsafe ambulation due to balance issues   Further, I certify that my clinical findings support that this patient is homebound due to:  Ambulates short distances less than 300 feet   Reason for Medically Necessary Home Health Services:  Skilled Nursing- Change/Decline in Patient Status   Home Health  As directed    Questions:     To provide the following care/treatments:  RN      Follow-up Information   Follow up with [facility].   Contact information:   [address] [place] [address] [phone]      Signed: Fredirick Maudlin Pager [phone]  06/05/2013, 7:58 AM

## 2013-07-13 ENCOUNTER — Ambulatory Visit: Payer: Medicare Other | Admitting: Gastroenterology

## 2013-08-12 ENCOUNTER — Telehealth: Payer: Self-pay | Admitting: Gastroenterology

## 2013-08-12 ENCOUNTER — Ambulatory Visit: Payer: Medicare Other | Admitting: Gastroenterology

## 2013-08-12 NOTE — Telephone Encounter (Signed)
Pt was a no show

## 2013-08-13 NOTE — Telephone Encounter (Signed)
Please send letter.

## 2013-08-19 ENCOUNTER — Encounter: Payer: Self-pay | Admitting: Gastroenterology

## 2013-08-19 NOTE — Telephone Encounter (Signed)
MAILED LETTER °

## 2013-10-05 ENCOUNTER — Ambulatory Visit: Payer: Medicare Other | Admitting: Gastroenterology

## 2013-10-12 ENCOUNTER — Telehealth: Payer: Self-pay

## 2013-10-12 NOTE — Telephone Encounter (Signed)
Eat soft foods, take small bites, chew well.  Needs OV. She no-showed in Jan 2015.

## 2013-10-12 NOTE — Telephone Encounter (Signed)
Tried to call with no answer  

## 2013-10-12 NOTE — Telephone Encounter (Signed)
Pt is having trouble swallowing her food. When she eats food get stuck will not go up or down. Please advise

## 2013-10-26 NOTE — Telephone Encounter (Signed)
Mailed letter for her to make follow up appointment

## 2013-11-02 ENCOUNTER — Encounter (INDEPENDENT_AMBULATORY_CARE_PROVIDER_SITE_OTHER): Payer: Self-pay

## 2013-11-02 ENCOUNTER — Encounter: Payer: Self-pay | Admitting: Gastroenterology

## 2013-11-02 ENCOUNTER — Ambulatory Visit (INDEPENDENT_AMBULATORY_CARE_PROVIDER_SITE_OTHER): Payer: Medicare Other | Admitting: Gastroenterology

## 2013-11-02 VITALS — BP 93/48 | HR 71 | Temp 97.3°F | Ht 63.0 in | Wt 116.8 lb

## 2013-11-02 DIAGNOSIS — A0472 Enterocolitis due to Clostridium difficile, not specified as recurrent: Secondary | ICD-10-CM

## 2013-11-02 DIAGNOSIS — R1312 Dysphagia, oropharyngeal phase: Secondary | ICD-10-CM | POA: Insufficient documentation

## 2013-11-02 NOTE — Progress Notes (Signed)
Referring Provider: Fredirick MaudlinHawkins, Edward L, MD Primary Care Physician:  Fredirick MaudlinHAWKINS,EDWARD L, MD Primary GI: Dr. Jena Gaussourk   Chief Complaint  Patient presents with  . Dysphagia    HPI:   Elizabeth [name] is a 80 year old female presenting today with dysphagia. Hx of esophageal cervical web and Schatzki's ring s/p dilation  in 2013 by Dr. Jena Gaussourk. Inpatient for C diff colitis in Nov 2014, treated with Flagyl. Clinically improved without evidence of diarrhea now.  Notes increase in sinus drainage. Notes improvement after empiric dilation 2013. Oropharyngeal component. Has to use tea or water to get food down. Points to mouth and throat as issues with eating. Does not appear to have an outright esophageal component. Easily distracted, difficult to obtain a full history due to mild dementia.   Past Medical History  Diagnosis Date  . Thyroid goiter     s/p surgery now on Synthroid  . B12 deficiency   . Coronary artery disease   . Zenker diverticulum   . Hyperlipidemia   . Hypertension   . Osteoporosis   . GERD (gastroesophageal reflux disease)   . Hypercholesterolemia   . Hypothyroidism   . C. difficile colitis Nov 2014    Treated with Flagyl    Past Surgical History  Procedure Laterality Date  . Esophagogastroduodenoscopy  6/10     Schatzki's ring, otherwise endoscopically normal esophagus, status post dilation (58F), with mucosal disruption through the cervical segment as well as the Schatzki's ring. Hiatal hernia. segment as well as the Schatzki's ring. Hiatal hernia.  . Coronary artery bypass graft      Status Post four-vessel   . Bunionectomy    . Appendectomy    . Abdominal hysterectomy    . Esophagogastroduodenoscopy  09/02/2007    Cervical esophageal web, Schatzki's ring, status post  dilation and disruption as described above. Otherwise, normal esophagus  . Esophagogastroduodenoscopy  August 2011    Zenker's diverticulum, Schatzki's ring s/p dilation, mod to large HH, instructions  to pursue BPE if recurrent dysphagia to assess Zenker's  . Esophageal dilation    . Esophagogastroduodenoscopy  03/12/12    Rourk-dilated 18F, cervical web, Schatzki's ring, small HH, small Zenker's diverticulum    Current Outpatient Prescriptions  Medication Sig Dispense Refill  . amLODipine (NORVASC) 5 MG tablet Take 5 mg by mouth daily after breakfast.       . aspirin 325 MG tablet Take 325 mg by mouth every morning.       Marland Kitchen. atorvastatin (LIPITOR) 10 MG tablet Take 10 mg by mouth every morning.       Marland Kitchen. levothyroxine (SYNTHROID, LEVOTHROID) 100 MCG tablet Take 100 mcg by mouth daily before breakfast.      . quinapril (ACCUPRIL) 20 MG tablet Take 20 mg by mouth daily after breakfast.       . saccharomyces boulardii (FLORASTOR) 250 MG capsule Take 1 capsule (250 mg total) by mouth 2 (two) times daily.  60 capsule  3   No current facility-administered medications for this visit.    Allergies as of 11/02/2013  . (No Known Allergies)    Family History  Problem Relation Age of Onset  . Colon cancer Neg Hx     History   Social History  . Marital Status: Widowed    Spouse Name: N/A    Number of Children: N/A  . Years of Education: N/A   Occupational History  . retired    Social History Main Topics  . Smoking status: Never Smoker   .  Smokeless tobacco: Never Used  . Alcohol Use: No  . Drug Use: No  . Sexual Activity: Not Currently   Other Topics Concern  . None   Social History Narrative  . None    Review of Systems: As mentioned in HPI  Physical Exam: BP 93/48  Pulse 71  Temp(Src) 97.3 F (36.3 C) (Oral)  Ht 5\' 3"  (1.6 m)  Wt 116 lb 12.8 oz (52.98 kg)  BMI 20.70 kg/m2 General:   Alert and oriented. No distress noted. Pleasant and cooperative. Mild confusion regarding year. Difficult to keep on track, quite pleasant and talkative.  Head:  Normocephalic and atraumatic. Eyes:  Conjuctiva clear without scleral icterus. Mouth:  Oral mucosa pink and moist.    Heart:  S1, S2 present without murmurs, rubs, or gallops. Regular rate and rhythm. Abdomen:  +BS, soft, non-tender and non-distended. No rebound or guarding. No HSM or masses noted. Msk:  Symmetrical without gross deformities. Normal posture. Extremities:  Without edema. Psych:  Alert and cooperative. Normal mood and affect.

## 2013-11-02 NOTE — Patient Instructions (Signed)
You have been scheduled for an xray of your esophagus.  Further recommendations to follow!

## 2013-11-04 ENCOUNTER — Encounter: Payer: Self-pay | Admitting: Gastroenterology

## 2013-11-04 NOTE — Assessment & Plan Note (Signed)
Mild/moderate disease without complicating features in Nov 2014. Improved with Flagyl. Continue probiotic. Avoid antibiotics unless necessary. PPI use avoided unless absolutely necessary.

## 2013-11-04 NOTE — Assessment & Plan Note (Signed)
80 year old female with what appears to be more of an oropharyngeal component of dysphagia; last EGD in 2013 with dilation of cervical web and Schatzki's ring. I would like to obtain an updated BPE. If any evidence of stricture or indication for EGD, will proceed with EGD and dilation with Dr. Jena Gaussourk. Further recommendations to follow.

## 2013-11-05 ENCOUNTER — Ambulatory Visit (HOSPITAL_COMMUNITY)
Admission: RE | Admit: 2013-11-05 | Discharge: 2013-11-05 | Disposition: A | Discharge status: Home / Self Care | Payer: Medicare Other | Source: Ambulatory Visit | Attending: Gastroenterology | Admitting: Gastroenterology

## 2013-11-05 DIAGNOSIS — K449 Diaphragmatic hernia without obstruction or gangrene: Secondary | ICD-10-CM | POA: Insufficient documentation

## 2013-11-05 DIAGNOSIS — R1310 Dysphagia, unspecified: Secondary | ICD-10-CM | POA: Insufficient documentation

## 2013-11-05 DIAGNOSIS — K224 Dyskinesia of esophagus: Secondary | ICD-10-CM | POA: Insufficient documentation

## 2013-11-05 DIAGNOSIS — K225 Diverticulum of esophagus, acquired: Secondary | ICD-10-CM | POA: Insufficient documentation

## 2013-11-05 DIAGNOSIS — R1312 Dysphagia, oropharyngeal phase: Secondary | ICD-10-CM

## 2013-11-05 NOTE — Progress Notes (Signed)
cc'd to pcp 

## 2013-11-09 ENCOUNTER — Telehealth: Payer: Self-pay | Admitting: Internal Medicine

## 2013-11-09 NOTE — Telephone Encounter (Signed)
Routing to AS 

## 2013-11-09 NOTE — Telephone Encounter (Signed)
Caregiver (Phobe) called to see if we were going to scheduled an EGD on patient, because she hasn't heard anything from us yet. I told her that we haven't gotten any further recommendations from RMR and he may be waiting on the results from last week before scheduling. Please advise and call caregiver back.

## 2013-11-11 ENCOUNTER — Other Ambulatory Visit: Payer: Self-pay | Admitting: Internal Medicine

## 2013-11-11 ENCOUNTER — Encounter (HOSPITAL_COMMUNITY): Payer: Self-pay

## 2013-11-11 DIAGNOSIS — R1312 Dysphagia, oropharyngeal phase: Secondary | ICD-10-CM

## 2013-11-11 NOTE — Progress Notes (Signed)
Quick Note:  BPE reviewed.  Enlarged cricopharyngeal muscle with moderate-sized Zenker's diverticulum unchanged from previous exam. Obstruction of the 12.5 mm diameter barium tablet at the level of the cricopharyngeus muscle, similar to the previous study. Large hiatal hernia. Diffuse age-related dysmotility. Laryngeal penetration without aspiration.  Would benefit from repeat EGD/ED with Dr. Jena Gaussourk. May ultimately need Speech Pathology evaluation due to diffuse dysmotility Follow soft foods, eat slowly, chew well. Sit upright during and after eating.  Proceed with EGD/ED with RMR.  ______

## 2013-11-11 NOTE — Telephone Encounter (Signed)
I have made a result note. Needs EGD/ED

## 2013-11-11 NOTE — Telephone Encounter (Signed)
pts niece is aware of results and recommendations.

## 2013-11-12 ENCOUNTER — Telehealth: Payer: Self-pay | Admitting: *Deleted

## 2013-11-12 NOTE — Telephone Encounter (Signed)
I tried to call back but no answer nor machine, Ill try back

## 2013-11-12 NOTE — Telephone Encounter (Signed)
Kathie RhodesBetty called for pt about her TCS, Kathie RhodesBetty is pt's niece and she would like to change pt's procedure. Please advise Kathie RhodesBetty at [phone]

## 2013-11-12 NOTE — Telephone Encounter (Signed)
EGD/ED r/s to May 7th at 8:00

## 2013-11-26 ENCOUNTER — Encounter (HOSPITAL_COMMUNITY)
Admission: RE | Disposition: A | Discharge status: Home / Self Care | Payer: Self-pay | Source: Ambulatory Visit | Attending: Internal Medicine

## 2013-11-26 ENCOUNTER — Telehealth: Payer: Self-pay | Admitting: *Deleted

## 2013-11-26 ENCOUNTER — Ambulatory Visit (HOSPITAL_COMMUNITY)
Admission: RE | Admit: 2013-11-26 | Discharge: 2013-11-26 | Disposition: A | Discharge status: Home / Self Care | Payer: Medicare Other | Source: Ambulatory Visit | Attending: Internal Medicine | Admitting: Internal Medicine

## 2013-11-26 ENCOUNTER — Encounter (HOSPITAL_COMMUNITY): Payer: Self-pay | Admitting: *Deleted

## 2013-11-26 DIAGNOSIS — K449 Diaphragmatic hernia without obstruction or gangrene: Secondary | ICD-10-CM | POA: Insufficient documentation

## 2013-11-26 DIAGNOSIS — M810 Age-related osteoporosis without current pathological fracture: Secondary | ICD-10-CM | POA: Insufficient documentation

## 2013-11-26 DIAGNOSIS — I2510 Atherosclerotic heart disease of native coronary artery without angina pectoris: Secondary | ICD-10-CM | POA: Insufficient documentation

## 2013-11-26 DIAGNOSIS — K222 Esophageal obstruction: Secondary | ICD-10-CM

## 2013-11-26 DIAGNOSIS — K225 Diverticulum of esophagus, acquired: Secondary | ICD-10-CM

## 2013-11-26 DIAGNOSIS — I10 Essential (primary) hypertension: Secondary | ICD-10-CM | POA: Insufficient documentation

## 2013-11-26 DIAGNOSIS — E039 Hypothyroidism, unspecified: Secondary | ICD-10-CM | POA: Insufficient documentation

## 2013-11-26 DIAGNOSIS — Q393 Congenital stenosis and stricture of esophagus: Principal | ICD-10-CM

## 2013-11-26 DIAGNOSIS — Q394 Esophageal web: Secondary | ICD-10-CM

## 2013-11-26 DIAGNOSIS — E538 Deficiency of other specified B group vitamins: Secondary | ICD-10-CM | POA: Insufficient documentation

## 2013-11-26 DIAGNOSIS — R1312 Dysphagia, oropharyngeal phase: Secondary | ICD-10-CM

## 2013-11-26 DIAGNOSIS — Q391 Atresia of esophagus with tracheo-esophageal fistula: Secondary | ICD-10-CM

## 2013-11-26 DIAGNOSIS — E785 Hyperlipidemia, unspecified: Secondary | ICD-10-CM | POA: Insufficient documentation

## 2013-11-26 DIAGNOSIS — F0390 Unspecified dementia without behavioral disturbance: Secondary | ICD-10-CM | POA: Insufficient documentation

## 2013-11-26 DIAGNOSIS — Z7982 Long term (current) use of aspirin: Secondary | ICD-10-CM | POA: Insufficient documentation

## 2013-11-26 DIAGNOSIS — Z951 Presence of aortocoronary bypass graft: Secondary | ICD-10-CM | POA: Insufficient documentation

## 2013-11-26 DIAGNOSIS — K219 Gastro-esophageal reflux disease without esophagitis: Secondary | ICD-10-CM | POA: Insufficient documentation

## 2013-11-26 DIAGNOSIS — E7800 Pure hypercholesterolemia, unspecified: Secondary | ICD-10-CM | POA: Insufficient documentation

## 2013-11-26 HISTORY — PX: MALONEY DILATION: SHX5535

## 2013-11-26 HISTORY — PX: ESOPHAGOGASTRODUODENOSCOPY: SHX5428

## 2013-11-26 SURGERY — EGD (ESOPHAGOGASTRODUODENOSCOPY)
Anesthesia: Moderate Sedation

## 2013-11-26 MED ORDER — SIMETHICONE 40 MG/0.6ML PO SUSP
ORAL | Status: DC | PRN
  Administered 2013-11-26: 08:00:00

## 2013-11-26 MED ORDER — SODIUM CHLORIDE 0.9 % IV SOLN
INTRAVENOUS | Status: DC
  Administered 2013-11-26: 08:00:00 via INTRAVENOUS

## 2013-11-26 MED ORDER — MEPERIDINE HCL 100 MG/ML IJ SOLN
INTRAMUSCULAR | Status: DC | PRN
  Administered 2013-11-26 (×2): 25 mg via INTRAVENOUS

## 2013-11-26 MED ORDER — LIDOCAINE VISCOUS 2 % MT SOLN
OROMUCOSAL | Status: DC | PRN
  Administered 2013-11-26: 1 via OROMUCOSAL

## 2013-11-26 MED ORDER — LIDOCAINE VISCOUS 2 % MT SOLN
OROMUCOSAL | Status: AC
  Filled 2013-11-26: qty 15

## 2013-11-26 MED ORDER — ONDANSETRON HCL 4 MG/2ML IJ SOLN
INTRAMUSCULAR | Status: AC
  Filled 2013-11-26: qty 2

## 2013-11-26 MED ORDER — LANSOPRAZOLE 30 MG PO CPDR: 30.0000 mg | DELAYED_RELEASE_CAPSULE | Freq: Every day | ORAL | Status: DC

## 2013-11-26 MED ORDER — ONDANSETRON HCL 4 MG/2ML IJ SOLN
INTRAMUSCULAR | Status: DC | PRN
  Administered 2013-11-26: 4 mg via INTRAVENOUS

## 2013-11-26 MED ORDER — MIDAZOLAM HCL 5 MG/5ML IJ SOLN
INTRAMUSCULAR | Status: DC | PRN
  Administered 2013-11-26 (×3): 1 mg via INTRAVENOUS

## 2013-11-26 MED ORDER — MIDAZOLAM HCL 5 MG/5ML IJ SOLN
INTRAMUSCULAR | Status: AC
  Filled 2013-11-26: qty 5

## 2013-11-26 MED ORDER — MEPERIDINE HCL 100 MG/ML IJ SOLN
INTRAMUSCULAR | Status: AC
  Filled 2013-11-26: qty 1

## 2013-11-26 NOTE — Telephone Encounter (Signed)
rx per RMR recommendations has been sent to the pharmacy

## 2013-11-26 NOTE — Telephone Encounter (Signed)
Pt needs her RX sent to [facility].

## 2013-11-26 NOTE — Discharge Instructions (Addendum)
EGD Discharge instructions Please read the instructions outlined below and refer to this sheet in the next few weeks. These discharge instructions provide you with general information on caring for yourself after you leave the hospital. Your doctor may also give you specific instructions. While your treatment has been planned according to the most current medical practices available, unavoidable complications occasionally occur. If you have any problems or questions after discharge, please call your doctor. ACTIVITY You may resume your regular activity but move at a slower pace for the next 24 hours.  Take frequent rest periods for the next 24 hours.  Walking will help expel (get rid of) the air and reduce the bloated feeling in your abdomen.  No driving for 24 hours (because of the anesthesia (medicine) used during the test).  You may shower.  Do not sign any important legal documents or operate any machinery for 24 hours (because of the anesthesia used during the test).  NUTRITION Drink plenty of fluids.  You may resume your normal diet.  Begin with a light meal and progress to your normal diet.  Avoid alcoholic beverages for 24 hours or as instructed by your caregiver.  MEDICATIONS You may resume your normal medications unless your caregiver tells you otherwise.  WHAT YOU CAN EXPECT TODAY You may experience abdominal discomfort such as a feeling of fullness or gas pains.  FOLLOW-UP Your doctor will discuss the results of your test with you.  SEEK IMMEDIATE MEDICAL ATTENTION IF ANY OF THE FOLLOWING OCCUR: Excessive nausea (feeling sick to your stomach) and/or vomiting.  Severe abdominal pain and distention (swelling).  Trouble swallowing.  Temperature over 101 F (37.8 C).  Rectal bleeding or vomiting of blood.     Resume Prevacid 30 mg daily  If you have recurrent swallowing difficulties, you may need to see the ears, nose and throat doctor regarding the pouch in the back of  your throat  Office visit with us in 6 months

## 2013-11-26 NOTE — H&P (View-Only) (Signed)
Referring Provider: Fredirick MaudlinHawkins, Edward L, MD Primary Care Physician:  Fredirick MaudlinHAWKINS,EDWARD L, MD Primary GI: Dr. Jena Gaussourk   Chief Complaint  Patient presents with  . Dysphagia    HPI:   Elizabeth [name] is a 80 year old female presenting today with dysphagia. Hx of esophageal cervical web and Schatzki's ring s/p dilation  in 2013 by Dr. Jena Gaussourk. Inpatient for C diff colitis in Nov 2014, treated with Flagyl. Clinically improved without evidence of diarrhea now.  Notes increase in sinus drainage. Notes improvement after empiric dilation 2013. Oropharyngeal component. Has to use tea or water to get food down. Points to mouth and throat as issues with eating. Does not appear to have an outright esophageal component. Easily distracted, difficult to obtain a full history due to mild dementia.   Past Medical History  Diagnosis Date  . Thyroid goiter     s/p surgery now on Synthroid  . B12 deficiency   . Coronary artery disease   . Zenker diverticulum   . Hyperlipidemia   . Hypertension   . Osteoporosis   . GERD (gastroesophageal reflux disease)   . Hypercholesterolemia   . Hypothyroidism   . C. difficile colitis Nov 2014    Treated with Flagyl    Past Surgical History  Procedure Laterality Date  . Esophagogastroduodenoscopy  6/10     Schatzki's ring, otherwise endoscopically normal esophagus, status post dilation (58F), with mucosal disruption through the cervical segment as well as the Schatzki's ring. Hiatal hernia. segment as well as the Schatzki's ring. Hiatal hernia.  . Coronary artery bypass graft      Status Post four-vessel   . Bunionectomy    . Appendectomy    . Abdominal hysterectomy    . Esophagogastroduodenoscopy  09/02/2007    Cervical esophageal web, Schatzki's ring, status post  dilation and disruption as described above. Otherwise, normal esophagus  . Esophagogastroduodenoscopy  August 2011    Zenker's diverticulum, Schatzki's ring s/p dilation, mod to large HH, instructions  to pursue BPE if recurrent dysphagia to assess Zenker's  . Esophageal dilation    . Esophagogastroduodenoscopy  03/12/12    Rourk-dilated 18F, cervical web, Schatzki's ring, small HH, small Zenker's diverticulum    Current Outpatient Prescriptions  Medication Sig Dispense Refill  . amLODipine (NORVASC) 5 MG tablet Take 5 mg by mouth daily after breakfast.       . aspirin 325 MG tablet Take 325 mg by mouth every morning.       Marland Kitchen. atorvastatin (LIPITOR) 10 MG tablet Take 10 mg by mouth every morning.       Marland Kitchen. levothyroxine (SYNTHROID, LEVOTHROID) 100 MCG tablet Take 100 mcg by mouth daily before breakfast.      . quinapril (ACCUPRIL) 20 MG tablet Take 20 mg by mouth daily after breakfast.       . saccharomyces boulardii (FLORASTOR) 250 MG capsule Take 1 capsule (250 mg total) by mouth 2 (two) times daily.  60 capsule  3   No current facility-administered medications for this visit.    Allergies as of 11/02/2013  . (No Known Allergies)    Family History  Problem Relation Age of Onset  . Colon cancer Neg Hx     History   Social History  . Marital Status: Widowed    Spouse Name: N/A    Number of Children: N/A  . Years of Education: N/A   Occupational History  . retired    Social History Main Topics  . Smoking status: Never Smoker   .  Smokeless tobacco: Never Used  . Alcohol Use: No  . Drug Use: No  . Sexual Activity: Not Currently   Other Topics Concern  . None   Social History Narrative  . None    Review of Systems: As mentioned in HPI  Physical Exam: BP 93/48  Pulse 71  Temp(Src) 97.3 F (36.3 C) (Oral)  Ht 5\' 3"  (1.6 m)  Wt 116 lb 12.8 oz (52.98 kg)  BMI 20.70 kg/m2 General:   Alert and oriented. No distress noted. Pleasant and cooperative. Mild confusion regarding year. Difficult to keep on track, quite pleasant and talkative.  Head:  Normocephalic and atraumatic. Eyes:  Conjuctiva clear without scleral icterus. Mouth:  Oral mucosa pink and moist.    Heart:  S1, S2 present without murmurs, rubs, or gallops. Regular rate and rhythm. Abdomen:  +BS, soft, non-tender and non-distended. No rebound or guarding. No HSM or masses noted. Msk:  Symmetrical without gross deformities. Normal posture. Extremities:  Without edema. Psych:  Alert and cooperative. Normal mood and affect.

## 2013-11-26 NOTE — Op Note (Signed)
[facility] [address] [place], [id number]   ENDOSCOPY PROCEDURE REPORT  PATIENT: Elizabeth [name], Elizabeth L.  MR#: [id number] BIRTHDATE: [birth year] , 89  yrs. old GENDER: Female ENDOSCOPIST: R.  Roetta SessionsMichael Jamerson Vonbargen, MD FACP [facility] REFERRED BY:  Kari BaarsEdward Hawkins, M.D. PROCEDURE DATE:  11/26/2013 PROCEDURE:     EGD with Elease HashimotoMaloney dilation  INDICATIONS:      recurrent esophageal dysphagia in the setting of a cricopharyngeal web, Zenker's diverticulum and Schatzki's ring  INFORMED CONSENT:   The risks, benefits, limitations, alternatives and imponderables have been discussed.  The potential for biopsy, esophogeal dilation, etc. have also been reviewed.  Questions have been answered.  All parties agreeable.  Please see the history and physical in the medical record for more information.  MEDICATIONS:    Versed 3 mg IV and Demerol 50 mg IV in divided doses. Xylocaine gel orally. Zofran 4 mg IV.  DESCRIPTION OF PROCEDURE:   The EG-[id number] ([id number]  endoscope was introduced through the mouth and advanced to the second portion of the duodenum without difficulty or limitations.  The mucosal surfaces were surveyed very carefully during advancement of the scope and upon withdrawal.  Retroflexion view of the proximal stomach and esophagogastric junction was performed.      FINDINGS:   Examination of the hypopharynx/cricopharyngeal area demonstrated a Zenker's diverticulum that appeared to be slightly larger than previously seen. Patient had a critical Schatzki's ring distally which initially held passage of the scope with general pressure is able to push through it with a simultaneous small dilation of the ring occurring. There is no esophagitis or evidence of Barrett's esophagus. Stomach empty. Large hiatal hernia present. Abnormal gastric mucosa. Patent pylorus. Normal first and second portion of the duodenum.   THERAPEUTIC / DIAGNOSTIC MANEUVERS PERFORMED:  utilizing a 24  French Maloney dilator, carefully advanced to through the hypopharynx and on to full insertion with only mild resistance upon full insertion. A look back revealed an apparent cervical esophageal web nicely dilated along with nice disruption of the shots Q. drink. There is minimal bleeding and no. Complication related to this maneuver.   COMPLICATIONS:  None  IMPRESSION:    Zenker's diverticulum. Cervical esophageal web and Schatzki's ring  -- status post Maloney dilation as described above. Large hiatal hernia.   At least some of her oropharyngeal dysphagia symptoms may be related to an enlarging Zenker's diverticulum.  RECOMMENDATIONS: Resume Prevacid 30 mg daily-  take indefinitely. Office visit with us in 6 months.  If she has persisting oropharyngeal dysphagia symptoms, she may benefit from ENT referral for consideration of repair of the Zenker's diverticulum.    _______________________________ R. Roetta SessionsMichael Hoke Baer, MD FACP [facility] eSigned:  R. Roetta SessionsMichael Kashana Breach, MD FACP [facility] 11/26/2013 8:50 AM     CC:  PATIENT NAME:  Elizabeth [name], Elizabeth L. MR#: [id number]

## 2013-11-26 NOTE — Interval H&P Note (Signed)
History and Physical Interval Note:  11/26/2013 8:09 AM  Elizabeth FeyEstelle L [name]  has presented today for surgery, with the diagnosis of Oropharyngeal dysphagia  The various methods of treatment have been discussed with the patient and family. After consideration of risks, benefits and other options for treatment, the patient has consented to  Procedure(s) with comments: ESOPHAGOGASTRODUODENOSCOPY (EGD) (N/A) - 1:45-rescheduled to [address] notified pt  SAVORY DILATION (N/A) MALONEY DILATION (N/A) as a surgical intervention .  The patient's history has been reviewed, patient examined, no change in status, stable for surgery.  I have reviewed the patient's chart and labs.  Questions were answered to the patient's satisfaction.     Gerrit Friendsobert M Naylin Burkle  No change. EGD with esophageal dilation /appropriate.The risks, benefits, limitations, alternatives and imponderables have been reviewed with the patient. Potential for esophageal dilation, biopsy, etc. have also been reviewed.  Questions have been answered. All parties agreeable.

## 2013-12-01 ENCOUNTER — Encounter (HOSPITAL_COMMUNITY): Payer: Self-pay | Admitting: Internal Medicine

## 2013-12-17 ENCOUNTER — Telehealth: Payer: Self-pay | Admitting: Internal Medicine

## 2013-12-17 NOTE — Telephone Encounter (Signed)
Per RMR discharge instructions, pt needs to have an ov in 6 months.

## 2013-12-17 NOTE — Telephone Encounter (Signed)
Reminder in epic and patient aware

## 2013-12-17 NOTE — Telephone Encounter (Signed)
Thanks, I will NIC that and let patient know that I will be in touch closer to that time.

## 2013-12-17 NOTE — Telephone Encounter (Signed)
Pt's caregiver called to see when patient needed to follow up again. She said she thinks it's in 6 months, but not sure. I didn't see anything other than recommendations to follow per RMR. Please advise when I should NIC patient for a follow up.

## 2014-04-06 ENCOUNTER — Telehealth: Payer: Self-pay | Admitting: Internal Medicine

## 2014-04-06 NOTE — Telephone Encounter (Signed)
PATIENT FRIEND, [name], CALLED AND STATED THAT PATIENT IS CHOKING ON HER FOOD AND NEEDS APPOINTMENT.  PLEASE ADVISE ON WHEN TO SCHEDULE PATIENT.  CALL [name] AT [phone]

## 2014-04-06 NOTE — Telephone Encounter (Signed)
[name] came to the office to speak with me. She said Ms. Virrueta is her aunt and she is getting choked on her food. Per RMR procedure note from May- pt will need to see an ENT if she continues to have difficulty swallowing. She on on the recall list for November to see [place]. Informed pts niece that I would speak with RMR and see if he wants to go ahead and refer pt to ENT or if he wants pt to be seen in the office first and I would call her tomorrow.   Pt is eating soft foods now and drinking plenty of liquids, asked her to make sure all meats are ground, if she consumes meat and if she gets worse or the food gets stuck in her throat she should go to the ED. She verbalized understanding and agreed with the plan.   Dr.Rourk- do you want pt seen in the office or do you want to refer pt to ENT?

## 2014-04-07 ENCOUNTER — Other Ambulatory Visit: Payer: Self-pay

## 2014-04-07 DIAGNOSIS — R1312 Dysphagia, oropharyngeal phase: Secondary | ICD-10-CM

## 2014-04-07 DIAGNOSIS — K222 Esophageal obstruction: Secondary | ICD-10-CM

## 2014-04-07 DIAGNOSIS — R1310 Dysphagia, unspecified: Secondary | ICD-10-CM

## 2014-04-07 NOTE — Telephone Encounter (Signed)
Ginger, please schedule. 

## 2014-04-07 NOTE — Telephone Encounter (Signed)
Patient needs a barium pill esophagram now to evaluate her swallowing difficulties. Further recommendations based on results.

## 2014-04-07 NOTE — Telephone Encounter (Signed)
Pt is set up for barium pill esophagram on 04/19/14 @ 1:00. Pt's family is aware of appointment

## 2014-04-08 ENCOUNTER — Emergency Department (HOSPITAL_COMMUNITY)
Admission: EM | Admit: 2014-04-08 | Discharge: 2014-04-09 | Disposition: A | Discharge status: Home / Self Care | Payer: Medicare Other | Attending: Emergency Medicine | Admitting: Emergency Medicine

## 2014-04-08 ENCOUNTER — Encounter (HOSPITAL_COMMUNITY): Payer: Self-pay | Admitting: Emergency Medicine

## 2014-04-08 ENCOUNTER — Emergency Department (HOSPITAL_COMMUNITY): Payer: Medicare Other

## 2014-04-08 DIAGNOSIS — Z951 Presence of aortocoronary bypass graft: Secondary | ICD-10-CM | POA: Insufficient documentation

## 2014-04-08 DIAGNOSIS — S0003XA Contusion of scalp, initial encounter: Secondary | ICD-10-CM | POA: Insufficient documentation

## 2014-04-08 DIAGNOSIS — W010XXA Fall on same level from slipping, tripping and stumbling without subsequent striking against object, initial encounter: Secondary | ICD-10-CM | POA: Diagnosis not present

## 2014-04-08 DIAGNOSIS — Z8619 Personal history of other infectious and parasitic diseases: Secondary | ICD-10-CM | POA: Diagnosis not present

## 2014-04-08 DIAGNOSIS — Z79899 Other long term (current) drug therapy: Secondary | ICD-10-CM | POA: Diagnosis not present

## 2014-04-08 DIAGNOSIS — E049 Nontoxic goiter, unspecified: Secondary | ICD-10-CM | POA: Diagnosis not present

## 2014-04-08 DIAGNOSIS — S1093XA Contusion of unspecified part of neck, initial encounter: Principal | ICD-10-CM

## 2014-04-08 DIAGNOSIS — Z7982 Long term (current) use of aspirin: Secondary | ICD-10-CM | POA: Diagnosis not present

## 2014-04-08 DIAGNOSIS — Y929 Unspecified place or not applicable: Secondary | ICD-10-CM | POA: Diagnosis not present

## 2014-04-08 DIAGNOSIS — S59919A Unspecified injury of unspecified forearm, initial encounter: Secondary | ICD-10-CM

## 2014-04-08 DIAGNOSIS — I2510 Atherosclerotic heart disease of native coronary artery without angina pectoris: Secondary | ICD-10-CM | POA: Insufficient documentation

## 2014-04-08 DIAGNOSIS — S59909A Unspecified injury of unspecified elbow, initial encounter: Secondary | ICD-10-CM | POA: Diagnosis not present

## 2014-04-08 DIAGNOSIS — Y9389 Activity, other specified: Secondary | ICD-10-CM | POA: Diagnosis not present

## 2014-04-08 DIAGNOSIS — E7800 Pure hypercholesterolemia, unspecified: Secondary | ICD-10-CM | POA: Diagnosis not present

## 2014-04-08 DIAGNOSIS — M25532 Pain in left wrist: Secondary | ICD-10-CM

## 2014-04-08 DIAGNOSIS — Z8775 Personal history of (corrected) congenital malformations of respiratory system: Secondary | ICD-10-CM | POA: Insufficient documentation

## 2014-04-08 DIAGNOSIS — S199XXA Unspecified injury of neck, initial encounter: Secondary | ICD-10-CM

## 2014-04-08 DIAGNOSIS — I10 Essential (primary) hypertension: Secondary | ICD-10-CM | POA: Diagnosis not present

## 2014-04-08 DIAGNOSIS — S6990XA Unspecified injury of unspecified wrist, hand and finger(s), initial encounter: Secondary | ICD-10-CM | POA: Insufficient documentation

## 2014-04-08 DIAGNOSIS — S0993XA Unspecified injury of face, initial encounter: Secondary | ICD-10-CM | POA: Insufficient documentation

## 2014-04-08 DIAGNOSIS — K219 Gastro-esophageal reflux disease without esophagitis: Secondary | ICD-10-CM | POA: Insufficient documentation

## 2014-04-08 DIAGNOSIS — S0083XA Contusion of other part of head, initial encounter: Secondary | ICD-10-CM | POA: Insufficient documentation

## 2014-04-08 MED ORDER — HYDROCODONE-ACETAMINOPHEN 5-325 MG PO TABS: 2.0000 | ORAL_TABLET | ORAL | Status: DC | PRN

## 2014-04-08 NOTE — Discharge Instructions (Signed)

## 2014-04-08 NOTE — ED Notes (Signed)
Pt with deep lac to inside of upper lip, pt's top dentures broke with fall, swelling and bruising noted to nose, below nose, abrasions noted to nose and right temple, dried blood noted to lips and pt cleaned of dried blood

## 2014-04-08 NOTE — ED Notes (Signed)
Family at bedside. 

## 2014-04-08 NOTE — ED Provider Notes (Addendum)
CSN: [id number]     Arrival date & time 04/08/14  1932 History   First MD Initiated Contact with Patient 04/08/14 1934     Chief Complaint  Patient presents with  . Fall      HPI  Pt presents after falling forward onto concrete.  C/O facial bruising, left wrist pain, lateral neck pain.  Past Medical History  Diagnosis Date  . Thyroid goiter     s/p surgery now on Synthroid  . B12 deficiency   . Coronary artery disease   . Zenker diverticulum   . Hyperlipidemia   . Hypertension   . Osteoporosis   . GERD (gastroesophageal reflux disease)   . Hypercholesterolemia   . Hypothyroidism   . C. difficile colitis Nov 2014    Treated with Flagyl   Past Surgical History  Procedure Laterality Date  . Esophagogastroduodenoscopy  6/10     Schatzki's ring, otherwise endoscopically normal esophagus, status post dilation (45F), with mucosal disruption through the cervical segment as well as the Schatzki's ring. Hiatal hernia. segment as well as the Schatzki's ring. Hiatal hernia.  . Coronary artery bypass graft      Status Post four-vessel   . Bunionectomy    . Appendectomy    . Abdominal hysterectomy    . Esophagogastroduodenoscopy  09/02/2007    Cervical esophageal web, Schatzki's ring, status post  dilation and disruption as described above. Otherwise, normal esophagus  . Esophagogastroduodenoscopy  August 2011    Zenker's diverticulum, Schatzki's ring s/p dilation, mod to large HH, instructions to pursue BPE if recurrent dysphagia to assess Zenker's  . Esophageal dilation    . Esophagogastroduodenoscopy  03/12/12    Rourk-dilated 5F, cervical web, Schatzki's ring, small HH, small Zenker's diverticulum  . Esophagogastroduodenoscopy N/A 11/26/2013    Procedure: ESOPHAGOGASTRODUODENOSCOPY (EGD);  Surgeon: Corbin Ade, MD;  [facility]: [facility];  Service: Endoscopy;  Laterality: N/A;  1:45-rescheduled to [address] notified pt   . Maloney dilation N/A 11/26/2013    Procedure:  Elease Hashimoto DILATION;  Surgeon: Corbin Ade, MD;  Location: [facility];  Service: Endoscopy;  Laterality: N/A;   Family History  Problem Relation Age of Onset  . Colon cancer Neg Hx    History  Substance Use Topics  . Smoking status: Never Smoker   . Smokeless tobacco: Never Used  . Alcohol Use: No   OB History   Grav Para Term Preterm Abortions TAB SAB Ect Mult Living            0     Review of Systems  Constitutional: Negative for fever, chills, diaphoresis, appetite change and fatigue.  HENT: Negative for mouth sores, sore throat and trouble swallowing.        Facial contusions  Eyes: Negative for visual disturbance.  Respiratory: Negative for cough, chest tightness, shortness of breath and wheezing.   Cardiovascular: Negative for chest pain.  Gastrointestinal: Negative for nausea, vomiting, abdominal pain, diarrhea and abdominal distention.  Endocrine: Negative for polydipsia, polyphagia and polyuria.  Genitourinary: Negative for dysuria, frequency and hematuria.  Musculoskeletal: Negative for gait problem.       Lt wrist pain  Skin: Negative for color change, pallor and rash.  Neurological: Negative for dizziness, syncope, light-headedness and headaches.  Hematological: Does not bruise/bleed easily.  Psychiatric/Behavioral: Negative for behavioral problems and confusion.      Allergies  Review of patient's allergies indicates no known allergies.  Home Medications   Prior to Admission medications  Medication Sig Start Date End Date Taking? Authorizing Provider  amLODipine (NORVASC) 5 MG tablet Take 5 mg by mouth daily after breakfast.  12/10/11  Yes Historical Provider, MD  aspirin 325 MG tablet Take 325 mg by mouth every morning.    Yes Historical Provider, MD  atorvastatin (LIPITOR) 10 MG tablet Take 10 mg by mouth every morning.  12/10/11  Yes Historical Provider, MD  fesoterodine (TOVIAZ) 4 MG TB24 tablet Take 4 mg by mouth daily.   Yes Historical Provider, MD   lansoprazole (PREVACID) 30 MG capsule Take 1 capsule (30 mg total) by mouth daily. 11/26/13  Yes Corbin Ade, MD  levothyroxine (SYNTHROID, LEVOTHROID) 75 MCG tablet Take 75 mcg by mouth daily before breakfast.   Yes Historical Provider, MD  quinapril (ACCUPRIL) 20 MG tablet Take 20 mg by mouth daily after breakfast.  12/10/11  Yes Historical Provider, MD  saccharomyces boulardii (FLORASTOR) 250 MG capsule Take 1 capsule (250 mg total) by mouth 2 (two) times daily. 06/01/13  Yes Fredirick Maudlin, MD  HYDROcodone-acetaminophen (NORCO/VICODIN) 5-325 MG per tablet Take 2 tablets by mouth every 4 (four) hours as needed. 04/08/14   Rolland Porter, MD   BP 134/70  Pulse 80  Temp(Src) 97.8 F (36.6 C) (Oral)  Resp 18  SpO2 94% Physical Exam  Constitutional: She is oriented to person, place, and time. She appears well-developed and well-nourished. No distress.  HENT:  Head: Normocephalic.  Facial contusions.  No blood over TM, mastoid, or from Ears, nose, and mouth.  Normal V1-V3 sensation. No dental trauma.  Eyes: Conjunctivae are normal. Pupils are equal, round, and reactive to light. No scleral icterus.  Neck: Normal range of motion. Neck supple. No thyromegaly present.    Cardiovascular: Normal rate and regular rhythm.  Exam reveals no gallop and no friction rub.   No murmur heard. Pulmonary/Chest: Effort normal and breath sounds normal. No respiratory distress. She has no wheezes. She has no rales.  Abdominal: Soft. Bowel sounds are normal. She exhibits no distension. There is no tenderness. There is no rebound.  Musculoskeletal: Normal range of motion.  Neurological: She is alert and oriented to person, place, and time.  Normal symmetric Strength to shoulder shrug, triceps, biceps, grip,wrist flex/extend,and intrinsics  Norma lsymmetric sensation above and below clavicles, and to all distributions to UEs. Norma symmetric strength to flex/.extend hip and knees, dorsi/plantar flex  ankles. Normal symmetric sensation to all distributions to LEs Patellar and achilles reflexes 1-2+. Downgoing Babinski lue limited by pain at wrist   Skin: Skin is warm and dry. No rash noted.  Psychiatric: She has a normal mood and affect. Her behavior is normal.    ED Course  Procedures (including critical care time) Labs Review Labs Reviewed - No data to display  Imaging Review Dg Wrist Complete Left  04/08/2014   CLINICAL DATA:  Wrist pain after falling today. Tripped and fell onto concrete.  EXAM: LEFT WRIST - COMPLETE 3+ VIEW  COMPARISON:  Report of previous study from 06/16/2002. That exam is not available for direct comparison.  FINDINGS: There is deformity the distal radius consistent with prior fracture. On the lateral view, there is question of an acute component at the articular surface. However, the majority of the deformity is favored to be chronic. The distal ulna is intact. There is soft tissue swelling at the wrist. Significant radiocarpal joint space narrowing. There is degenerative change in the first carpometacarpal joint.  IMPRESSION: 1. Deformity of the distal radius is  favored to be chronic. 2. Acute component is difficult to entirely exclude. Consider follow-up radiographs.   Electronically Signed   By: Rosalie Gums M.D.   On: 04/08/2014 22:38   Ct Head Wo Contrast  04/08/2014   CLINICAL DATA:  80 year old female with head, face and neck injury following fall. Headache, facial swelling and neck pain.  EXAM: CT HEAD WITHOUT CONTRAST  CT MAXILLOFACIAL WITHOUT CONTRAST  CT CERVICAL SPINE WITHOUT CONTRAST  TECHNIQUE: Multidetector CT imaging of the head, cervical spine, and maxillofacial structures were performed using the standard protocol without intravenous contrast. Multiplanar CT image reconstructions of the cervical spine and maxillofacial structures were also generated.  COMPARISON:  None.  FINDINGS: CT HEAD FINDINGS  Mild atrophy, remote bilateral cerebellar infarcts and  mild chronic small-vessel white matter ischemic changes are identified.  No acute intracranial abnormalities are identified, including mass lesion or mass effect, hydrocephalus, extra-axial fluid collection, midline shift, hemorrhage, or acute infarction. The visualized bony calvarium is unremarkable.  CT MAXILLOFACIAL FINDINGS  There is no evidence of fracture, subluxation or dislocation.  The paranasal sinuses, mastoid air cells and middle/ inner ears are clear.  No focal bony lesions are identified.  Mild degenerative changes at the TMJs are noted.  CT CERVICAL SPINE FINDINGS  3 mm anterolisthesis of C4 on C5 is noted.  There is no evidence of acute fracture or prevertebral soft tissue swelling.  Moderate degenerative disc disease and spondylosis at C5-6 and C6-7 noted as well as moderate facet arthropathy throughout the cervical spine.  No focal bony lesions are present.  IMPRESSION: 3 mm anterolisthesis of C4 on C5 -age indeterminate but may be degenerative. Consider flexion-extension views as indicated.  No evidence of acute cervical spine fracture or prevertebral soft tissue swelling.  No evidence of acute intracranial abnormality.  No acute bony facial abnormality.  Atrophy and remote bilateral cerebellar infarcts.   Electronically Signed   By: Laveda Abbe M.D.   On: 04/08/2014 21:30   Ct Cervical Spine Wo Contrast  04/08/2014   CLINICAL DATA:  80 year old female with head, face and neck injury following fall. Headache, facial swelling and neck pain.  EXAM: CT HEAD WITHOUT CONTRAST  CT MAXILLOFACIAL WITHOUT CONTRAST  CT CERVICAL SPINE WITHOUT CONTRAST  TECHNIQUE: Multidetector CT imaging of the head, cervical spine, and maxillofacial structures were performed using the standard protocol without intravenous contrast. Multiplanar CT image reconstructions of the cervical spine and maxillofacial structures were also generated.  COMPARISON:  None.  FINDINGS: CT HEAD FINDINGS  Mild atrophy, remote bilateral  cerebellar infarcts and mild chronic small-vessel white matter ischemic changes are identified.  No acute intracranial abnormalities are identified, including mass lesion or mass effect, hydrocephalus, extra-axial fluid collection, midline shift, hemorrhage, or acute infarction. The visualized bony calvarium is unremarkable.  CT MAXILLOFACIAL FINDINGS  There is no evidence of fracture, subluxation or dislocation.  The paranasal sinuses, mastoid air cells and middle/ inner ears are clear.  No focal bony lesions are identified.  Mild degenerative changes at the TMJs are noted.  CT CERVICAL SPINE FINDINGS  3 mm anterolisthesis of C4 on C5 is noted.  There is no evidence of acute fracture or prevertebral soft tissue swelling.  Moderate degenerative disc disease and spondylosis at C5-6 and C6-7 noted as well as moderate facet arthropathy throughout the cervical spine.  No focal bony lesions are present.  IMPRESSION: 3 mm anterolisthesis of C4 on C5 -age indeterminate but may be degenerative. Consider flexion-extension views as indicated.  No evidence  of acute cervical spine fracture or prevertebral soft tissue swelling.  No evidence of acute intracranial abnormality.  No acute bony facial abnormality.  Atrophy and remote bilateral cerebellar infarcts.   Electronically Signed   By: Laveda Abbe M.D.   On: 04/08/2014 21:30   Ct Maxillofacial Wo Cm  04/08/2014   CLINICAL DATA:  80 year old female with head, face and neck injury following fall. Headache, facial swelling and neck pain.  EXAM: CT HEAD WITHOUT CONTRAST  CT MAXILLOFACIAL WITHOUT CONTRAST  CT CERVICAL SPINE WITHOUT CONTRAST  TECHNIQUE: Multidetector CT imaging of the head, cervical spine, and maxillofacial structures were performed using the standard protocol without intravenous contrast. Multiplanar CT image reconstructions of the cervical spine and maxillofacial structures were also generated.  COMPARISON:  None.  FINDINGS: CT HEAD FINDINGS  Mild atrophy, remote  bilateral cerebellar infarcts and mild chronic small-vessel white matter ischemic changes are identified.  No acute intracranial abnormalities are identified, including mass lesion or mass effect, hydrocephalus, extra-axial fluid collection, midline shift, hemorrhage, or acute infarction. The visualized bony calvarium is unremarkable.  CT MAXILLOFACIAL FINDINGS  There is no evidence of fracture, subluxation or dislocation.  The paranasal sinuses, mastoid air cells and middle/ inner ears are clear.  No focal bony lesions are identified.  Mild degenerative changes at the TMJs are noted.  CT CERVICAL SPINE FINDINGS  3 mm anterolisthesis of C4 on C5 is noted.  There is no evidence of acute fracture or prevertebral soft tissue swelling.  Moderate degenerative disc disease and spondylosis at C5-6 and C6-7 noted as well as moderate facet arthropathy throughout the cervical spine.  No focal bony lesions are present.  IMPRESSION: 3 mm anterolisthesis of C4 on C5 -age indeterminate but may be degenerative. Consider flexion-extension views as indicated.  No evidence of acute cervical spine fracture or prevertebral soft tissue swelling.  No evidence of acute intracranial abnormality.  No acute bony facial abnormality.  Atrophy and remote bilateral cerebellar infarcts.   Electronically Signed   By: Laveda Abbe M.D.   On: 04/08/2014 21:30     EKG Interpretation None      MDM   Final diagnoses:  Facial contusion, initial encounter  Left wrist pain   No acute abnormalities noted on head, neck, or face CT. No pain to palpate over the posterior neck. No pain with flexion extension of the neck. She has some tenderness and soft tissue swelling over the radius. No palpable crepitus. X-ray findings discussed with her family member. She will be placed in a splint. Primary care or orthopedic followup if still painful in a week. This may be an occult fracture. This may be a contusion over area of previous fracture. She is well  aligned radiographically and anatomically. Place a splint. I reexamined her. Her skin is protected. Appropriate for discharge.    Rolland Porter, MD 04/08/14 [id number]  Rolland Porter, MD 05/04/14 [phone]

## 2014-04-08 NOTE — ED Notes (Signed)
MD at bedside. 

## 2014-04-08 NOTE — ED Notes (Signed)
Refilled pt ice pack gave a cool washcloth

## 2014-04-08 NOTE — ED Notes (Signed)
Pt apparently fell face-first onto concrete in front of a restaurant.  No apparent loc, has multiple facial abrasions and nose bleed, all bleeding controlled at this time. Pt is awake and alert at this time.

## 2014-04-09 NOTE — ED Notes (Signed)
Wrist splint applied

## 2014-04-19 ENCOUNTER — Inpatient Hospital Stay (HOSPITAL_COMMUNITY): Admission: RE | Admit: 2014-04-19 | Payer: Medicare Other | Source: Ambulatory Visit

## 2014-04-20 ENCOUNTER — Ambulatory Visit (HOSPITAL_COMMUNITY)
Admission: RE | Admit: 2014-04-20 | Discharge: 2014-04-20 | Disposition: A | Discharge status: Home / Self Care | Payer: Medicare Other | Source: Ambulatory Visit | Attending: Internal Medicine | Admitting: Internal Medicine

## 2014-04-20 DIAGNOSIS — K224 Dyskinesia of esophagus: Secondary | ICD-10-CM | POA: Insufficient documentation

## 2014-04-20 DIAGNOSIS — R1310 Dysphagia, unspecified: Secondary | ICD-10-CM

## 2014-04-20 NOTE — Progress Notes (Signed)
Patient ID: Elizabeth [name], female   DOB: February 21, 1919, 80 y.o.   MRN: [id number] Patient was not able to swallow the barium pill. No obvious tight stricture distally, however anyway. Zenker's diverticulum persists. This is more likely the cause of her symptoms. She should see the otolaryngologist to discuss closing the diverticulum.

## 2014-04-26 ENCOUNTER — Other Ambulatory Visit: Payer: Self-pay

## 2014-04-26 DIAGNOSIS — R1312 Dysphagia, oropharyngeal phase: Secondary | ICD-10-CM

## 2014-04-26 NOTE — Progress Notes (Signed)
Ginger: Please see recommendations per Dr. Jena Gaussourk.

## 2014-04-26 NOTE — Progress Notes (Signed)
Referral made to Dr.Teoh

## 2014-05-10 ENCOUNTER — Encounter: Payer: Self-pay | Admitting: Internal Medicine

## 2014-05-13 ENCOUNTER — Ambulatory Visit (INDEPENDENT_AMBULATORY_CARE_PROVIDER_SITE_OTHER): Payer: Medicare Other | Admitting: Otolaryngology

## 2014-05-13 DIAGNOSIS — R1312 Dysphagia, oropharyngeal phase: Secondary | ICD-10-CM

## 2014-06-10 ENCOUNTER — Other Ambulatory Visit (HOSPITAL_COMMUNITY): Payer: Self-pay | Admitting: Otolaryngology

## 2014-06-10 DIAGNOSIS — R1310 Dysphagia, unspecified: Secondary | ICD-10-CM

## 2014-06-15 ENCOUNTER — Ambulatory Visit (HOSPITAL_COMMUNITY)
Admission: RE | Admit: 2014-06-15 | Discharge: 2014-06-15 | Disposition: A | Discharge status: Home / Self Care | Payer: Medicare Other | Source: Ambulatory Visit | Attending: Otolaryngology | Admitting: Otolaryngology

## 2014-06-15 DIAGNOSIS — K225 Diverticulum of esophagus, acquired: Secondary | ICD-10-CM | POA: Diagnosis not present

## 2014-06-15 DIAGNOSIS — R1310 Dysphagia, unspecified: Secondary | ICD-10-CM

## 2014-06-15 DIAGNOSIS — K224 Dyskinesia of esophagus: Secondary | ICD-10-CM | POA: Diagnosis not present

## 2014-06-15 DIAGNOSIS — K449 Diaphragmatic hernia without obstruction or gangrene: Secondary | ICD-10-CM | POA: Insufficient documentation

## 2014-10-07 ENCOUNTER — Ambulatory Visit (INDEPENDENT_AMBULATORY_CARE_PROVIDER_SITE_OTHER): Payer: Medicare Other | Admitting: Otolaryngology

## 2014-10-07 DIAGNOSIS — H6121 Impacted cerumen, right ear: Secondary | ICD-10-CM | POA: Diagnosis not present

## 2014-10-07 DIAGNOSIS — H903 Sensorineural hearing loss, bilateral: Secondary | ICD-10-CM

## 2014-10-16 ENCOUNTER — Emergency Department (HOSPITAL_COMMUNITY): Payer: Medicare Other

## 2014-10-16 ENCOUNTER — Inpatient Hospital Stay (HOSPITAL_COMMUNITY)
Admission: EM | Admit: 2014-10-16 | Discharge: 2014-10-19 | DRG: 684 | Disposition: A | Discharge status: Home Health | Payer: Medicare Other | Attending: Pulmonary Disease | Admitting: Pulmonary Disease

## 2014-10-16 ENCOUNTER — Encounter (HOSPITAL_COMMUNITY): Payer: Self-pay | Admitting: *Deleted

## 2014-10-16 DIAGNOSIS — D638 Anemia in other chronic diseases classified elsewhere: Secondary | ICD-10-CM | POA: Diagnosis present

## 2014-10-16 DIAGNOSIS — N183 Chronic kidney disease, stage 3 (moderate): Secondary | ICD-10-CM | POA: Diagnosis present

## 2014-10-16 DIAGNOSIS — K529 Noninfective gastroenteritis and colitis, unspecified: Secondary | ICD-10-CM | POA: Diagnosis present

## 2014-10-16 DIAGNOSIS — R197 Diarrhea, unspecified: Secondary | ICD-10-CM | POA: Diagnosis not present

## 2014-10-16 DIAGNOSIS — K625 Hemorrhage of anus and rectum: Secondary | ICD-10-CM | POA: Diagnosis present

## 2014-10-16 DIAGNOSIS — I129 Hypertensive chronic kidney disease with stage 1 through stage 4 chronic kidney disease, or unspecified chronic kidney disease: Secondary | ICD-10-CM | POA: Diagnosis present

## 2014-10-16 DIAGNOSIS — E039 Hypothyroidism, unspecified: Secondary | ICD-10-CM | POA: Diagnosis present

## 2014-10-16 DIAGNOSIS — I2510 Atherosclerotic heart disease of native coronary artery without angina pectoris: Secondary | ICD-10-CM | POA: Diagnosis present

## 2014-10-16 DIAGNOSIS — R52 Pain, unspecified: Secondary | ICD-10-CM

## 2014-10-16 DIAGNOSIS — N179 Acute kidney failure, unspecified: Principal | ICD-10-CM | POA: Diagnosis present

## 2014-10-16 DIAGNOSIS — M810 Age-related osteoporosis without current pathological fracture: Secondary | ICD-10-CM | POA: Diagnosis present

## 2014-10-16 DIAGNOSIS — K219 Gastro-esophageal reflux disease without esophagitis: Secondary | ICD-10-CM | POA: Diagnosis present

## 2014-10-16 DIAGNOSIS — D649 Anemia, unspecified: Secondary | ICD-10-CM | POA: Diagnosis present

## 2014-10-16 DIAGNOSIS — E780 Pure hypercholesterolemia: Secondary | ICD-10-CM | POA: Diagnosis present

## 2014-10-16 DIAGNOSIS — E785 Hyperlipidemia, unspecified: Secondary | ICD-10-CM | POA: Diagnosis present

## 2014-10-16 DIAGNOSIS — E860 Dehydration: Secondary | ICD-10-CM | POA: Diagnosis not present

## 2014-10-16 DIAGNOSIS — I10 Essential (primary) hypertension: Secondary | ICD-10-CM | POA: Diagnosis present

## 2014-10-16 DIAGNOSIS — Z951 Presence of aortocoronary bypass graft: Secondary | ICD-10-CM | POA: Diagnosis not present

## 2014-10-16 LAB — CBC WITH DIFFERENTIAL/PLATELET
BASOS ABS: 0.2 10*3/uL — AB (ref 0.0–0.1)
Band Neutrophils: 0 % (ref 0–10)
Basophils Relative: 2 % — ABNORMAL HIGH (ref 0–1)
Blasts: 0 %
Eosinophils Absolute: 0 10*3/uL (ref 0.0–0.7)
Eosinophils Relative: 0 % (ref 0–5)
HCT: 35.6 % — ABNORMAL LOW (ref 36.0–46.0)
HEMOGLOBIN: 11.2 g/dL — AB (ref 12.0–15.0)
Lymphocytes Relative: 11 % — ABNORMAL LOW (ref 12–46)
Lymphs Abs: 1.1 10*3/uL (ref 0.7–4.0)
MCH: 26 pg (ref 26.0–34.0)
MCHC: 31.5 g/dL (ref 30.0–36.0)
MCV: 82.8 fL (ref 78.0–100.0)
Metamyelocytes Relative: 0 %
Monocytes Absolute: 0.6 10*3/uL (ref 0.1–1.0)
Monocytes Relative: 6 % (ref 3–12)
Myelocytes: 0 %
Neutro Abs: 8 10*3/uL — ABNORMAL HIGH (ref 1.7–7.7)
Neutrophils Relative %: 81 % — ABNORMAL HIGH (ref 43–77)
Platelets: 172 10*3/uL (ref 150–400)
Promyelocytes Absolute: 0 %
RBC: 4.3 MIL/uL (ref 3.87–5.11)
RDW: 15.5 % (ref 11.5–15.5)
WBC: 9.9 10*3/uL (ref 4.0–10.5)
nRBC: 0 /100 WBC

## 2014-10-16 LAB — COMPREHENSIVE METABOLIC PANEL
ALBUMIN: 3.4 g/dL — AB (ref 3.5–5.2)
ALT: 13 U/L (ref 0–35)
AST: 27 U/L (ref 0–37)
Alkaline Phosphatase: 111 U/L (ref 39–117)
Anion gap: 9 (ref 5–15)
BILIRUBIN TOTAL: 0.8 mg/dL (ref 0.3–1.2)
BUN: 47 mg/dL — AB (ref 6–23)
CALCIUM: 9 mg/dL (ref 8.4–10.5)
CO2: 22 mmol/L (ref 19–32)
Chloride: 107 mmol/L (ref 96–112)
Creatinine, Ser: 1.47 mg/dL — ABNORMAL HIGH (ref 0.50–1.10)
GFR calc Af Amer: 34 mL/min — ABNORMAL LOW (ref >90)
GFR, EST NON AFRICAN AMERICAN: 30 mL/min — AB (ref >90)
Glucose, Bld: 128 mg/dL — ABNORMAL HIGH (ref 70–99)
Potassium: 4.3 mmol/L (ref 3.5–5.1)
SODIUM: 138 mmol/L (ref 135–145)
Total Protein: 6.9 g/dL (ref 6.0–8.3)

## 2014-10-16 MED ORDER — ACETAMINOPHEN 325 MG PO TABS: 650.0000 mg | ORAL_TABLET | Freq: Four times a day (QID) | ORAL | Status: DC | PRN

## 2014-10-16 MED ORDER — KETOROLAC TROMETHAMINE 30 MG/ML IJ SOLN
15.0000 mg | Freq: Once | INTRAMUSCULAR | Status: AC
  Administered 2014-10-16: 15 mg via INTRAVENOUS
  Filled 2014-10-16: qty 1

## 2014-10-16 MED ORDER — AMLODIPINE BESYLATE 5 MG PO TABS
5.0000 mg | ORAL_TABLET | Freq: Every day | ORAL | Status: DC
  Administered 2014-10-18 – 2014-10-19 (×2): 5 mg via ORAL
  Filled 2014-10-16 (×3): qty 1

## 2014-10-16 MED ORDER — ACETAMINOPHEN 650 MG RE SUPP: 650.0000 mg | Freq: Four times a day (QID) | RECTAL | Status: DC | PRN

## 2014-10-16 MED ORDER — ONDANSETRON HCL 4 MG PO TABS: 4.0000 mg | ORAL_TABLET | Freq: Four times a day (QID) | ORAL | Status: DC | PRN

## 2014-10-16 MED ORDER — SODIUM CHLORIDE 0.9 % IV BOLUS (SEPSIS)
500.0000 mL | Freq: Once | INTRAVENOUS | Status: AC
  Administered 2014-10-16: 500 mL via INTRAVENOUS

## 2014-10-16 MED ORDER — SODIUM CHLORIDE 0.9 % IV SOLN
INTRAVENOUS | Status: AC
  Administered 2014-10-16 – 2014-10-17 (×2): 1000 mL via INTRAVENOUS

## 2014-10-16 MED ORDER — FESOTERODINE FUMARATE ER 4 MG PO TB24
4.0000 mg | ORAL_TABLET | Freq: Every day | ORAL | Status: DC
  Administered 2014-10-17 – 2014-10-19 (×3): 4 mg via ORAL
  Filled 2014-10-16 (×4): qty 1

## 2014-10-16 MED ORDER — METRONIDAZOLE IN NACL 5-0.79 MG/ML-% IV SOLN
500.0000 mg | Freq: Three times a day (TID) | INTRAVENOUS | Status: DC
  Administered 2014-10-16 – 2014-10-19 (×8): 500 mg via INTRAVENOUS
  Filled 2014-10-16 (×8): qty 100

## 2014-10-16 MED ORDER — PANTOPRAZOLE SODIUM 40 MG PO TBEC
40.0000 mg | DELAYED_RELEASE_TABLET | Freq: Two times a day (BID) | ORAL | Status: DC
  Administered 2014-10-16 – 2014-10-18 (×4): 40 mg via ORAL
  Filled 2014-10-16 (×4): qty 1

## 2014-10-16 MED ORDER — CIPROFLOXACIN IN D5W 200 MG/100ML IV SOLN
200.0000 mg | Freq: Two times a day (BID) | INTRAVENOUS | Status: DC
  Administered 2014-10-17 – 2014-10-19 (×6): 200 mg via INTRAVENOUS
  Filled 2014-10-16 (×8): qty 100

## 2014-10-16 MED ORDER — ATORVASTATIN CALCIUM 10 MG PO TABS
10.0000 mg | ORAL_TABLET | Freq: Every morning | ORAL | Status: DC
  Administered 2014-10-17 – 2014-10-19 (×3): 10 mg via ORAL
  Filled 2014-10-16 (×3): qty 1

## 2014-10-16 MED ORDER — ONDANSETRON HCL 4 MG/2ML IJ SOLN: 4.0000 mg | Freq: Four times a day (QID) | INTRAMUSCULAR | Status: DC | PRN

## 2014-10-16 MED ORDER — LEVOTHYROXINE SODIUM 75 MCG PO TABS
75.0000 ug | ORAL_TABLET | Freq: Every day | ORAL | Status: DC
  Administered 2014-10-17 – 2014-10-19 (×3): 75 ug via ORAL
  Filled 2014-10-16 (×3): qty 1

## 2014-10-16 MED ORDER — HYDROCODONE-ACETAMINOPHEN 5-325 MG PO TABS: 2.0000 | ORAL_TABLET | Freq: Four times a day (QID) | ORAL | Status: DC | PRN

## 2014-10-16 MED ORDER — SACCHAROMYCES BOULARDII 250 MG PO CAPS
250.0000 mg | ORAL_CAPSULE | Freq: Two times a day (BID) | ORAL | Status: DC
Start: 2014-10-16 — End: 2014-10-18
  Administered 2014-10-16 – 2014-10-18 (×4): 250 mg via ORAL
  Filled 2014-10-16 (×4): qty 1

## 2014-10-16 NOTE — ED Notes (Signed)
Pt c/o diarrhea since earlier today. (more than 1 episode) Pt also told her nephew that she thinks she saw a little blood.

## 2014-10-16 NOTE — ED Provider Notes (Signed)
CSN: [id number]     Arrival date & time 10/16/14  1851 History   First MD Initiated Contact with Patient 10/16/14 1925     Chief Complaint  Patient presents with  . Diarrhea     (Consider location/radiation/quality/duration/timing/severity/associated sxs/prior Treatment) Patient is a 81 y.o. female presenting with diarrhea. The history is provided by a relative (pt has had diarhea and poor po intake today).  Diarrhea Quality:  Semi-solid Severity:  Mild Onset quality:  Sudden Timing:  Intermittent Relieved by:  Nothing Associated symptoms: no abdominal pain and no headaches     Past Medical History  Diagnosis Date  . Thyroid goiter     s/p surgery now on Synthroid  . B12 deficiency   . Coronary artery disease   . Zenker diverticulum   . Hyperlipidemia   . Hypertension   . Osteoporosis   . GERD (gastroesophageal reflux disease)   . Hypercholesterolemia   . Hypothyroidism   . C. difficile colitis Nov 2014    Treated with Flagyl   Past Surgical History  Procedure Laterality Date  . Esophagogastroduodenoscopy  6/10     Schatzki's ring, otherwise endoscopically normal esophagus, status post dilation (60F), with mucosal disruption through the cervical segment as well as the Schatzki's ring. Hiatal hernia. segment as well as the Schatzki's ring. Hiatal hernia.  . Coronary artery bypass graft      Status Post four-vessel   . Bunionectomy    . Appendectomy    . Abdominal hysterectomy    . Esophagogastroduodenoscopy  09/02/2007    Cervical esophageal web, Schatzki's ring, status post  dilation and disruption as described above. Otherwise, normal esophagus  . Esophagogastroduodenoscopy  August 2011    Zenker's diverticulum, Schatzki's ring s/p dilation, mod to large HH, instructions to pursue BPE if recurrent dysphagia to assess Zenker's  . Esophageal dilation    . Esophagogastroduodenoscopy  03/12/12    Rourk-dilated 60F, cervical web, Schatzki's ring, small HH, small Zenker's  diverticulum  . Esophagogastroduodenoscopy N/A 11/26/2013    Procedure: ESOPHAGOGASTRODUODENOSCOPY (EGD);  Surgeon: Corbin Ade, MD;  Location: [facility];  Service: Endoscopy;  Laterality: N/A;  1:45-rescheduled to [address] notified pt   . Maloney dilation N/A 11/26/2013    Procedure: Elease Hashimoto DILATION;  Surgeon: Corbin Ade, MD;  Location: [facility];  Service: Endoscopy;  Laterality: N/A;   Family History  Problem Relation Age of Onset  . Colon cancer Neg Hx    History  Substance Use Topics  . Smoking status: Never Smoker   . Smokeless tobacco: Never Used  . Alcohol Use: No   OB History    Gravida Para Term Preterm AB TAB SAB Ectopic Multiple Living            0     Review of Systems  Constitutional: Negative for appetite change and fatigue.  HENT: Negative for congestion, ear discharge and sinus pressure.   Eyes: Negative for discharge.  Respiratory: Negative for cough.   Cardiovascular: Negative for chest pain.  Gastrointestinal: Positive for diarrhea. Negative for abdominal pain.  Genitourinary: Negative for frequency and hematuria.  Musculoskeletal: Negative for back pain.  Skin: Negative for rash.  Neurological: Negative for seizures and headaches.  Psychiatric/Behavioral: Negative for hallucinations.      Allergies  Review of patient's allergies indicates no known allergies.  Home Medications   Prior to Admission medications   Medication Sig Start Date End Date Taking? Authorizing Provider  amLODipine (NORVASC) 5 MG tablet Take  5 mg by mouth daily after breakfast.  12/10/11   Historical Provider, MD  aspirin 325 MG tablet Take 325 mg by mouth every morning.     Historical Provider, MD  atorvastatin (LIPITOR) 10 MG tablet Take 10 mg by mouth every morning.  12/10/11   Historical Provider, MD  fesoterodine (TOVIAZ) 4 MG TB24 tablet Take 4 mg by mouth daily.    Historical Provider, MD  HYDROcodone-acetaminophen (NORCO/VICODIN) 5-325 MG per tablet Take 2  tablets by mouth every 4 (four) hours as needed. 04/08/14   Rolland PorterMark James, MD  lansoprazole (PREVACID) 30 MG capsule Take 1 capsule (30 mg total) by mouth daily. 11/26/13   Corbin Adeobert M Rourk, MD  levothyroxine (SYNTHROID, LEVOTHROID) 75 MCG tablet Take 75 mcg by mouth daily before breakfast.    Historical Provider, MD  quinapril (ACCUPRIL) 20 MG tablet Take 20 mg by mouth daily after breakfast.  12/10/11   Historical Provider, MD  saccharomyces boulardii (FLORASTOR) 250 MG capsule Take 1 capsule (250 mg total) by mouth 2 (two) times daily. 06/01/13   Kari BaarsEdward Hawkins, MD   BP 116/64 mmHg  Pulse 87  Temp(Src) 98.7 F (37.1 C) (Oral)  Resp 28  Ht 5\' 5"  (1.651 m)  Wt 105 lb (47.628 kg)  BMI 17.47 kg/m2  SpO2 100% Physical Exam  Constitutional: She is oriented to person, place, and time. She appears well-developed.  HENT:  Head: Normocephalic.  Mucous membranes dry  Eyes: Conjunctivae and EOM are normal. No scleral icterus.  Neck: Neck supple. No thyromegaly present.  Cardiovascular: Normal rate and regular rhythm.  Exam reveals no gallop and no friction rub.   No murmur heard. Pulmonary/Chest: No stridor. She has no wheezes. She has no rales. She exhibits no tenderness.  Abdominal: She exhibits no distension. There is no tenderness. There is no rebound.  Musculoskeletal: Normal range of motion. She exhibits no edema.  Lymphadenopathy:    She has no cervical adenopathy.  Neurological: She is oriented to person, place, and time. She exhibits normal muscle tone. Coordination normal.  Skin: No rash noted. No erythema.  Psychiatric: She has a normal mood and affect. Her behavior is normal.    ED Course  Procedures (including critical care time) Labs Review Labs Reviewed  CBC WITH DIFFERENTIAL/PLATELET - Abnormal; Notable for the following:    Hemoglobin 11.2 (*)    HCT 35.6 (*)    Neutrophils Relative % 81 (*)    Lymphocytes Relative 11 (*)    Basophils Relative 2 (*)    Neutro Abs 8.0 (*)     Basophils Absolute 0.2 (*)    All other components within normal limits  COMPREHENSIVE METABOLIC PANEL - Abnormal; Notable for the following:    Glucose, Bld 128 (*)    BUN 47 (*)    Creatinine, Ser 1.47 (*)    Albumin 3.4 (*)    GFR calc non Af Amer 30 (*)    GFR calc Af Amer 34 (*)    All other components within normal limits  CLOSTRIDIUM DIFFICILE BY PCR  URINALYSIS, ROUTINE W REFLEX MICROSCOPIC    Imaging Review No results found.   EKG Interpretation None      MDM   Final diagnoses:  Pain  Dehydration    admit   Bethann BerkshireJoseph Giordano Getman, MD 10/16/14 2125

## 2014-10-16 NOTE — H&P (Addendum)
Patient's PCP: Fredirick MaudlinHAWKINS,EDWARD L, MD  Chief Complaint: Diarrhea  History of Present Illness: Elizabeth [name] is a 81 y.o. Caucasian female with history of hypothyroidism, anemia, hyperlipidemia, hypertension, GERD, hard of hearing, and history of C. difficile colitis who presents with the above complaints.  Patient provided some history, family and that side provided most of the history.  Patient started having diarrhea that started at 11 a.m.-12 p.m.  She has had approximately 5 watery bowel movements with small amount of blood in her stools.  She also has been having some generalized abdominal cramps.  Due to ongoing symptoms, she presented to the emergency department for further evaluation.  She has been having some chills at home, but denies any fevers.  Denies any chest pain, shortness of breath, headaches or vision changes.  In the emergency department she was found to be in mild renal insufficiency from dehydration.  She was given IV fluids and hospitalist service was asked to admit the patient for further care and management.  Review of Systems: All systems reviewed with the patient and positive as per history of present illness, otherwise all other systems are negative.  Past Medical History  Diagnosis Date  . Thyroid goiter     s/p surgery now on Synthroid  . B12 deficiency   . Coronary artery disease   . Zenker diverticulum   . Hyperlipidemia   . Hypertension   . Osteoporosis   . GERD (gastroesophageal reflux disease)   . Hypercholesterolemia   . Hypothyroidism   . C. difficile colitis Nov 2014    Treated with Flagyl   Past Surgical History  Procedure Laterality Date  . Esophagogastroduodenoscopy  6/10     Schatzki's ring, otherwise endoscopically normal esophagus, status post dilation (58F), with mucosal disruption through the cervical segment as well as the Schatzki's ring. Hiatal hernia. segment as well as the Schatzki's ring. Hiatal hernia.  . Coronary artery bypass graft       Status Post four-vessel   . Bunionectomy    . Appendectomy    . Abdominal hysterectomy    . Esophagogastroduodenoscopy  09/02/2007    Cervical esophageal web, Schatzki's ring, status post  dilation and disruption as described above. Otherwise, normal esophagus  . Esophagogastroduodenoscopy  August 2011    Zenker's diverticulum, Schatzki's ring s/p dilation, mod to large HH, instructions to pursue BPE if recurrent dysphagia to assess Zenker's  . Esophageal dilation    . Esophagogastroduodenoscopy  03/12/12    Rourk-dilated 42F, cervical web, Schatzki's ring, small HH, small Zenker's diverticulum  . Esophagogastroduodenoscopy N/A 11/26/2013    Procedure: ESOPHAGOGASTRODUODENOSCOPY (EGD);  Surgeon: Corbin Adeobert M Rourk, MD;  Location: [facility];  Service: Endoscopy;  Laterality: N/A;  1:45-rescheduled to 800 [address] notified pt   . Maloney dilation N/A 11/26/2013    Procedure: Elease HashimotoMALONEY DILATION;  Surgeon: Corbin Adeobert M Rourk, MD;  Location: [facility];  Service: Endoscopy;  Laterality: N/A;   Family History  Problem Relation Age of Onset  . Colon cancer Neg Hx    History   Social History  . Marital Status: Widowed    Spouse Name: N/A  . Number of Children: N/A  . Years of Education: N/A   Occupational History  . retired    Social History Main Topics  . Smoking status: Never Smoker   . Smokeless tobacco: Never Used  . Alcohol Use: No  . Drug Use: No  . Sexual Activity: Not Currently   Other Topics Concern  . Not  on file   Social History Narrative   Allergies: Review of patient's allergies indicates no known allergies.  Home Meds: Prior to Admission medications   Medication Sig Start Date End Date Taking? Authorizing Provider  amLODipine (NORVASC) 5 MG tablet Take 5 mg by mouth daily after breakfast.  12/10/11   Historical Provider, MD  aspirin 325 MG tablet Take 325 mg by mouth every morning.     Historical Provider, MD  atorvastatin (LIPITOR) 10 MG tablet Take 10 mg by  mouth every morning.  12/10/11   Historical Provider, MD  fesoterodine (TOVIAZ) 4 MG TB24 tablet Take 4 mg by mouth daily.    Historical Provider, MD  HYDROcodone-acetaminophen (NORCO/VICODIN) 5-325 MG per tablet Take 2 tablets by mouth every 4 (four) hours as needed. 04/08/14   Rolland Porter, MD  lansoprazole (PREVACID) 30 MG capsule Take 1 capsule (30 mg total) by mouth daily. 11/26/13   Corbin Ade, MD  levothyroxine (SYNTHROID, LEVOTHROID) 75 MCG tablet Take 75 mcg by mouth daily before breakfast.    Historical Provider, MD  quinapril (ACCUPRIL) 20 MG tablet Take 20 mg by mouth daily after breakfast.  12/10/11   Historical Provider, MD  saccharomyces boulardii (FLORASTOR) 250 MG capsule Take 1 capsule (250 mg total) by mouth 2 (two) times daily. 06/01/13   [name], MD    Physical Exam: Blood pressure 116/64, pulse 87, temperature 98.7 F (37.1 C), temperature source Oral, resp. rate 28, height  (1.651 m), weight 47.628 kg (105 lb), SpO2 100 %. General: Awake, Oriented x3, No acute distress. HEENT: EOMI, Moist mucous membranes Neck: Supple CV: S1 and S2 Lungs: Clear to ascultation bilaterally Abdomen: Soft, Nontender, Nondistended, +bowel sounds. Ext: Good pulses. Trace edema. No clubbing or cyanosis noted. Neuro: Cranial Nerves II-XII grossly intact. Has 5/5 motor strength in upper and lower extremities.  Lab results:  Recent Labs  10/16/14 1940  NA 138  K 4.3  CL 107  CO2 22  GLUCOSE 128*  BUN 47*  CREATININE 1.47*  CALCIUM 9.0    Recent Labs  10/16/14 1940  AST 27  ALT 13  ALKPHOS 111  BILITOT 0.8  PROT 6.9  ALBUMIN 3.4*   No results for input(s): LIPASE, AMYLASE in the last 72 hours.  Recent Labs  10/16/14 1940  WBC 9.9  NEUTROABS 8.0*  HGB 11.2*  HCT 35.6*  MCV 82.8  PLT 172   No results for input(s): CKTOTAL, CKMB, CKMBINDEX, TROPONINI in the last 72 hours. Invalid input(s): POCBNP No results for input(s): DDIMER in the last 72 hours. No  results for input(s): HGBA1C in the last 72 hours. No results for input(s): CHOL, HDL, LDLCALC, TRIG, CHOLHDL, LDLDIRECT in the last 72 hours. No results for input(s): TSH, T4TOTAL, T3FREE, THYROIDAB in the last 72 hours.  Invalid input(s): FREET3 No results for input(s): VITAMINB12, FOLATE, FERRITIN, TIBC, IRON, RETICCTPCT in the last 72 hours. Imaging results:  Dg Abd Acute W/chest  10/16/2014   CLINICAL DATA:  81 year old female with diarrhea for 1 day.  Pain.  EXAM: ACUTE ABDOMEN SERIES (ABDOMEN 2 VIEW & CHEST 1 VIEW)  COMPARISON:  CT 05/29/2013  FINDINGS: Patient is post median sternotomy. The heart is enlarged. There diffuse coarse reticular opacities and may be chronic. There is a moderate hiatal hernia. No dilated bowel loops to suggest obstruction. There is air throughout normal caliber small and large bowel loops in the abdomen. Mild amorphous appearance of the distal descending and sigmoid colon. No free intra-abdominal air.  No radiopaque calculi are seen. There is scoliotic curvature of the spine and bony under mineralization.  IMPRESSION: 1. No bowel obstruction. Mild amorphous appearance of the distal descending and sigmoid colon which is air-filled, can be seen in the setting of colitis. 2. Moderate hiatal hernia. 3. Cardiomegaly.   Electronically Signed   By: Rubye Oaks M.D.   On: 10/16/2014 21:36   Assessment & Plan by Problem: Diarrhea Unclear etiology.  Patient denies any recent use of antibiotics.  Abdominal x-ray, per radiology has amorphous appearance of the distal descending and sigmoid colon which is air-filled, can be seen in the setting of colitis.  Check stool for C. difficile and stool culture.  Start patient on empiric antibiotics with ciprofloxacin and metronidazole for infectious colitis pending stool studies.  If stool studies are negative, may consider consultation with gastroenterology for further evaluation. Start clears and advance as tolerated.  Rectal  bleeding Likely due to diarrhea.  Possibly diverticular bleed.  If patient has ongoing bleeding, consider GI evaluation.  Cycle CBC.  Hemoglobin stable at this time.  On PPI twice a day.  Acute colitis Management as indicated above.  Dehydration Due to diarrhea.  Continue gentle hydration.  Acute renal failure on chronic kidney disease stage III Likely due to dehydration.  Gently hydrate the patient will IV fluids.  Reassess renal function and the morning.  Hold patient's ACEI.  Anemia Likely due to chronic disease/chronic kidney disease.  Continue to monitor.  Hypertension Stable.  Continue home antihypertensive medications, except ACEI.  Hypothyroidism Continue levothyroxine.  GERD on PPI.   Prophylaxis SCDs, no heparin given concern for bleeding.  CODE STATUS Full code, this was addressed with the patient at the time of admission the family .  Per family, patient in her living will has expressed not to be put on life support. Further discussions as per her primary care physician.  Disposition Admit the patient to medical bed as inpatient.    Time spent on admission, talking to the patient, and coordinating care was: 50 mins.  Elizabeth [name] A, MD 10/16/2014, 9:53 PM

## 2014-10-17 LAB — CBC
HCT: 31.7 % — ABNORMAL LOW (ref 36.0–46.0)
HCT: 29.1 % — ABNORMAL LOW (ref 36.0–46.0)
HEMATOCRIT: 29.5 % — AB (ref 36.0–46.0)
HEMOGLOBIN: 10.1 g/dL — AB (ref 12.0–15.0)
HEMOGLOBIN: 9.2 g/dL — AB (ref 12.0–15.0)
Hemoglobin: 9.4 g/dL — ABNORMAL LOW (ref 12.0–15.0)
MCH: 26.1 pg (ref 26.0–34.0)
MCH: 26.1 pg (ref 26.0–34.0)
MCH: 25.9 pg — AB (ref 26.0–34.0)
MCHC: 31.9 g/dL (ref 30.0–36.0)
MCHC: 31.9 g/dL (ref 30.0–36.0)
MCHC: 31.6 g/dL (ref 30.0–36.0)
MCV: 81.9 fL (ref 78.0–100.0)
MCV: 81.9 fL (ref 78.0–100.0)
MCV: 82 fL (ref 78.0–100.0)
PLATELETS: 127 10*3/uL — AB (ref 150–400)
PLATELETS: 131 10*3/uL — AB (ref 150–400)
Platelets: 147 10*3/uL — ABNORMAL LOW (ref 150–400)
RBC: 3.87 MIL/uL (ref 3.87–5.11)
RBC: 3.6 MIL/uL — AB (ref 3.87–5.11)
RBC: 3.55 MIL/uL — ABNORMAL LOW (ref 3.87–5.11)
RDW: 15.5 % (ref 11.5–15.5)
RDW: 15.5 % (ref 11.5–15.5)
RDW: 15.4 % (ref 11.5–15.5)
WBC: 5.6 10*3/uL (ref 4.0–10.5)
WBC: 5.8 10*3/uL (ref 4.0–10.5)
WBC: 5.2 10*3/uL (ref 4.0–10.5)

## 2014-10-17 LAB — URINALYSIS, ROUTINE W REFLEX MICROSCOPIC
Bilirubin Urine: NEGATIVE
Glucose, UA: NEGATIVE mg/dL
Hgb urine dipstick: NEGATIVE
Ketones, ur: NEGATIVE mg/dL
Nitrite: NEGATIVE
PH: 5.5 (ref 5.0–8.0)
Protein, ur: NEGATIVE mg/dL
Specific Gravity, Urine: 1.025 (ref 1.005–1.030)
Urobilinogen, UA: 0.2 mg/dL (ref 0.0–1.0)

## 2014-10-17 LAB — BASIC METABOLIC PANEL
Anion gap: 7 (ref 5–15)
BUN: 42 mg/dL — ABNORMAL HIGH (ref 6–23)
CALCIUM: 8.1 mg/dL — AB (ref 8.4–10.5)
CO2: 23 mmol/L (ref 19–32)
CREATININE: 1.41 mg/dL — AB (ref 0.50–1.10)
Chloride: 110 mmol/L (ref 96–112)
GFR, EST AFRICAN AMERICAN: 36 mL/min — AB (ref >90)
GFR, EST NON AFRICAN AMERICAN: 31 mL/min — AB (ref >90)
Glucose, Bld: 86 mg/dL (ref 70–99)
POTASSIUM: 4.3 mmol/L (ref 3.5–5.1)
SODIUM: 140 mmol/L (ref 135–145)

## 2014-10-17 LAB — URINE MICROSCOPIC-ADD ON

## 2014-10-17 LAB — OCCULT BLOOD X 1 CARD TO LAB, STOOL: Fecal Occult Bld: POSITIVE — AB

## 2014-10-17 LAB — CLOSTRIDIUM DIFFICILE BY PCR: Toxigenic C. Difficile by PCR: NEGATIVE

## 2014-10-17 MED ORDER — CIPROFLOXACIN IN D5W 200 MG/100ML IV SOLN
INTRAVENOUS | Status: AC
  Filled 2014-10-17: qty 100

## 2014-10-17 NOTE — Plan of Care (Signed)
D/C enteric contact precautions due to the patients C. Diff results being negative.

## 2014-10-17 NOTE — Progress Notes (Signed)
Subjective: She was admitted with diarrhea. She says that's better but she still has a feeling that she is uncomfortable in her abdomen with cramping. She has no other new complaints.  Objective: Vital signs in last 24 hours: Temp:  [98.3 F (36.8 C)-98.7 F (37.1 C)] 98.6 F (37 C) (03/27 0601) Pulse Rate:  [68-87] 68 (03/27 0601) Resp:  [16-28] 20 (03/27 0601) BP: (115-123)/(48-64) 120/48 mmHg (03/27 0601) SpO2:  [96 %-100 %] 97 % (03/27 0601) Weight:  [47.628 kg (105 lb)-55.43 kg (122 lb 3.2 oz)] 55.43 kg (122 lb 3.2 oz) (03/27 0601) Weight change:  Last BM Date: 10/16/14  Intake/Output from previous day: 03/26 0701 - 03/27 0700 In: -  Out: 451 [Urine:450; Stool:1]  PHYSICAL EXAM General appearance: alert, cooperative, mild distress and Heart of hearing Resp: rhonchi bilaterally Cardio: regular rate and rhythm, S1, S2 normal, no murmur, click, rub or gallop GI: soft, non-tender; bowel sounds normal; no masses,  no organomegaly Extremities: extremities normal, atraumatic, no cyanosis or edema  Lab Results:  Results for orders placed or performed during the hospital encounter of 10/16/14 (from the past 48 hour(s))  CBC with Differential/Platelet     Status: Abnormal   Collection Time: 10/16/14  7:40 PM  Result Value Ref Range   WBC 9.9 4.0 - 10.5 K/uL   RBC 4.30 3.87 - 5.11 MIL/uL   Hemoglobin 11.2 (L) 12.0 - 15.0 g/dL   HCT 35.6 (L) 36.0 - 46.0 %   MCV 82.8 78.0 - 100.0 fL   MCH 26.0 26.0 - 34.0 pg   MCHC 31.5 30.0 - 36.0 g/dL   RDW 15.5 11.5 - 15.5 %   Platelets 172 150 - 400 K/uL   Neutrophils Relative % 81 (H) 43 - 77 %   Lymphocytes Relative 11 (L) 12 - 46 %   Monocytes Relative 6 3 - 12 %   Eosinophils Relative 0 0 - 5 %   Basophils Relative 2 (H) 0 - 1 %   Band Neutrophils 0 0 - 10 %   Metamyelocytes Relative 0 %   Myelocytes 0 %   Promyelocytes Absolute 0 %   Blasts 0 %   nRBC 0 0 /100 WBC   Neutro Abs 8.0 (H) 1.7 - 7.7 K/uL   Lymphs Abs 1.1 0.7 - 4.0  K/uL   Monocytes Absolute 0.6 0.1 - 1.0 K/uL   Eosinophils Absolute 0.0 0.0 - 0.7 K/uL   Basophils Absolute 0.2 (H) 0.0 - 0.1 K/uL  Comprehensive metabolic panel     Status: Abnormal   Collection Time: 10/16/14  7:40 PM  Result Value Ref Range   Sodium 138 135 - 145 mmol/L   Potassium 4.3 3.5 - 5.1 mmol/L   Chloride 107 96 - 112 mmol/L   CO2 22 19 - 32 mmol/L   Glucose, Bld 128 (H) 70 - 99 mg/dL   BUN 47 (H) 6 - 23 mg/dL   Creatinine, Ser 1.47 (H) 0.50 - 1.10 mg/dL   Calcium 9.0 8.4 - 10.5 mg/dL   Total Protein 6.9 6.0 - 8.3 g/dL   Albumin 3.4 (L) 3.5 - 5.2 g/dL   AST 27 0 - 37 U/L   ALT 13 0 - 35 U/L   Alkaline Phosphatase 111 39 - 117 U/L   Total Bilirubin 0.8 0.3 - 1.2 mg/dL   GFR calc non Af Amer 30 (L) >90 mL/min   GFR calc Af Amer 34 (L) >90 mL/min    Comment: (NOTE) The eGFR has  been calculated using the CKD EPI equation. This calculation has not been validated in all clinical situations. eGFR's persistently <90 mL/min signify possible Chronic Kidney Disease.    Anion gap 9 5 - 15  Basic metabolic panel     Status: Abnormal   Collection Time: 10/17/14  5:54 AM  Result Value Ref Range   Sodium 140 135 - 145 mmol/L   Potassium 4.3 3.5 - 5.1 mmol/L   Chloride 110 96 - 112 mmol/L   CO2 23 19 - 32 mmol/L   Glucose, Bld 86 70 - 99 mg/dL   BUN 42 (H) 6 - 23 mg/dL   Creatinine, Ser 1.41 (H) 0.50 - 1.10 mg/dL   Calcium 8.1 (L) 8.4 - 10.5 mg/dL   GFR calc non Af Amer 31 (L) >90 mL/min   GFR calc Af Amer 36 (L) >90 mL/min    Comment: (NOTE) The eGFR has been calculated using the CKD EPI equation. This calculation has not been validated in all clinical situations. eGFR's persistently <90 mL/min signify possible Chronic Kidney Disease.    Anion gap 7 5 - 15  CBC     Status: Abnormal   Collection Time: 10/17/14  5:54 AM  Result Value Ref Range   WBC 5.6 4.0 - 10.5 K/uL   RBC 3.87 3.87 - 5.11 MIL/uL   Hemoglobin 10.1 (L) 12.0 - 15.0 g/dL   HCT 31.7 (L) 36.0 - 46.0 %    MCV 81.9 78.0 - 100.0 fL   MCH 26.1 26.0 - 34.0 pg   MCHC 31.9 30.0 - 36.0 g/dL   RDW 15.5 11.5 - 15.5 %   Platelets 147 (L) 150 - 400 K/uL  Urinalysis, Routine w reflex microscopic     Status: Abnormal   Collection Time: 10/17/14  6:00 AM  Result Value Ref Range   Color, Urine AMBER (A) YELLOW    Comment: BIOCHEMICALS MAY BE AFFECTED BY COLOR   APPearance CLEAR CLEAR   Specific Gravity, Urine 1.025 1.005 - 1.030   pH 5.5 5.0 - 8.0   Glucose, UA NEGATIVE NEGATIVE mg/dL   Hgb urine dipstick NEGATIVE NEGATIVE   Bilirubin Urine NEGATIVE NEGATIVE   Ketones, ur NEGATIVE NEGATIVE mg/dL   Protein, ur NEGATIVE NEGATIVE mg/dL   Urobilinogen, UA 0.2 0.0 - 1.0 mg/dL   Nitrite NEGATIVE NEGATIVE   Leukocytes, UA TRACE (A) NEGATIVE  Clostridium Difficile by PCR     Status: None   Collection Time: 10/17/14  6:00 AM  Result Value Ref Range   C difficile by pcr NEGATIVE NEGATIVE  Occult blood card to lab, stool RN will collect     Status: Abnormal   Collection Time: 10/17/14  6:00 AM  Result Value Ref Range   Fecal Occult Bld POSITIVE (A) NEGATIVE  Urine microscopic-add on     Status: Abnormal   Collection Time: 10/17/14  6:00 AM  Result Value Ref Range   Squamous Epithelial / LPF FEW (A) RARE   WBC, UA 3-6 <3 WBC/hpf   Bacteria, UA FEW (A) RARE    ABGS No results for input(s): PHART, PO2ART, TCO2, HCO3 in the last 72 hours.  Invalid input(s): PCO2 CULTURES Recent Results (from the past 240 hour(s))  Clostridium Difficile by PCR     Status: None   Collection Time: 10/17/14  6:00 AM  Result Value Ref Range Status   C difficile by pcr NEGATIVE NEGATIVE Final   Studies/Results: Dg Abd Acute W/chest  10/16/2014   CLINICAL DATA:  81 year old Elizabeth [name]  with diarrhea for 1 day.  Pain.  EXAM: ACUTE ABDOMEN SERIES (ABDOMEN 2 VIEW & CHEST 1 VIEW)  COMPARISON:  CT 05/29/2013  FINDINGS: Patient is post median sternotomy. The heart is enlarged. There diffuse coarse reticular opacities and may be  chronic. There is a moderate hiatal hernia. No dilated bowel loops to suggest obstruction. There is air throughout normal caliber small and large bowel loops in the abdomen. Mild amorphous appearance of the distal descending and sigmoid colon. No free intra-abdominal air. No radiopaque calculi are seen. There is scoliotic curvature of the spine and bony under mineralization.  IMPRESSION: 1. No bowel obstruction. Mild amorphous appearance of the distal descending and sigmoid colon which is air-filled, can be seen in the setting of colitis. 2. Moderate hiatal hernia. 3. Cardiomegaly.   Electronically Signed   By: Jeb Levering M.D.   On: 10/16/2014 21:36    Medications:  Prior to Admission:  Prescriptions prior to admission  Medication Sig Dispense Refill Last Dose  . amLODipine (NORVASC) 5 MG tablet Take 5 mg by mouth daily after breakfast.    04/08/2014 at Unknown time  . aspirin 325 MG tablet Take 325 mg by mouth every morning.    04/08/2014 at Unknown time  . atorvastatin (LIPITOR) 10 MG tablet Take 10 mg by mouth every morning.    04/08/2014 at Unknown time  . fesoterodine (TOVIAZ) 4 MG TB24 tablet Take 4 mg by mouth daily.   04/08/2014 at Unknown time  . HYDROcodone-acetaminophen (NORCO/VICODIN) 5-325 MG per tablet Take 2 tablets by mouth every 4 (four) hours as needed. 10 tablet 0   . lansoprazole (PREVACID) 30 MG capsule Take 1 capsule (30 mg total) by mouth daily. 30 capsule 11 04/08/2014 at Unknown time  . levothyroxine (SYNTHROID, LEVOTHROID) Elizabeth MCG tablet Take Elizabeth mcg by mouth daily before breakfast.   04/08/2014 at Unknown time  . quinapril (ACCUPRIL) 20 MG tablet Take 20 mg by mouth daily after breakfast.    04/08/2014 at Unknown time  . saccharomyces boulardii (FLORASTOR) 250 MG capsule Take 1 capsule (250 mg total) by mouth 2 (two) times daily. 60 capsule 3 04/08/2014 at Unknown time   Scheduled: . amLODipine  5 mg Oral QPC breakfast  . atorvastatin  10 mg Oral q morning - 10a  .  ciprofloxacin  200 mg Intravenous Q12H  . fesoterodine  4 mg Oral Daily  . levothyroxine  Elizabeth mcg Oral QAC breakfast  . metronidazole  500 mg Intravenous Q8H  . pantoprazole  40 mg Oral BID  . saccharomyces boulardii  250 mg Oral BID   Continuous: . sodium chloride 1,000 mL (10/16/14 2231)   HGD:[id number] **OR** acetaminophen, HYDROcodone-acetaminophen, ondansetron **OR** ondansetron (ZOFRAN) IV  Assesment: She has diarrhea and appears to have acute colitis. She has acute renal failure probably from dehydration. She says she feels better. Principal Problem:   Diarrhea Active Problems:   GERD   Dehydration   Acute colitis   HTN (hypertension), benign   Hypothyroidism   ARF (acute renal failure)   Rectal bleeding   Anemia    Plan: Continue current treatments    LOS: 1 day   Keiston Manley L 10/17/2014, 8:54 AM

## 2014-10-18 ENCOUNTER — Inpatient Hospital Stay (HOSPITAL_COMMUNITY): Payer: Medicare Other

## 2014-10-18 ENCOUNTER — Encounter (HOSPITAL_COMMUNITY): Payer: Self-pay | Admitting: Radiology

## 2014-10-18 LAB — CBC
HCT: 28.7 % — ABNORMAL LOW (ref 36.0–46.0)
Hemoglobin: 9 g/dL — ABNORMAL LOW (ref 12.0–15.0)
MCH: 25.7 pg — ABNORMAL LOW (ref 26.0–34.0)
MCHC: 31.4 g/dL (ref 30.0–36.0)
MCV: 82 fL (ref 78.0–100.0)
PLATELETS: 136 10*3/uL — AB (ref 150–400)
RBC: 3.5 MIL/uL — ABNORMAL LOW (ref 3.87–5.11)
RDW: 15.6 % — ABNORMAL HIGH (ref 11.5–15.5)
WBC: 4.7 10*3/uL (ref 4.0–10.5)

## 2014-10-18 LAB — BASIC METABOLIC PANEL
Anion gap: 5 (ref 5–15)
BUN: 25 mg/dL — AB (ref 6–23)
CO2: 23 mmol/L (ref 19–32)
Calcium: 7.7 mg/dL — ABNORMAL LOW (ref 8.4–10.5)
Chloride: 111 mmol/L (ref 96–112)
Creatinine, Ser: 1.18 mg/dL — ABNORMAL HIGH (ref 0.50–1.10)
GFR, EST AFRICAN AMERICAN: 45 mL/min — AB (ref >90)
GFR, EST NON AFRICAN AMERICAN: 39 mL/min — AB (ref >90)
Glucose, Bld: 84 mg/dL (ref 70–99)
Potassium: 4.1 mmol/L (ref 3.5–5.1)
SODIUM: 139 mmol/L (ref 135–145)

## 2014-10-18 MED ORDER — IOHEXOL 300 MG/ML  SOLN
50.0000 mL | INTRAMUSCULAR | Status: AC
  Administered 2014-10-18: 50 mL via ORAL

## 2014-10-18 MED ORDER — IOHEXOL 300 MG/ML  SOLN
100.0000 mL | Freq: Once | INTRAMUSCULAR | Status: AC | PRN
  Administered 2014-10-18: 100 mL via INTRAVENOUS

## 2014-10-18 MED ORDER — BOOST HIGH PROTEIN PO LIQD
237.0000 mL | Freq: Three times a day (TID) | ORAL | Status: DC
  Administered 2014-10-19: 237 mL via ORAL
  Filled 2014-10-18 (×8): qty 237

## 2014-10-18 MED ORDER — RISAQUAD PO CAPS
2.0000 | ORAL_CAPSULE | Freq: Every day | ORAL | Status: DC
  Administered 2014-10-18 – 2014-10-19 (×2): 2 via ORAL
  Filled 2014-10-18 (×2): qty 2

## 2014-10-18 MED ORDER — PANTOPRAZOLE SODIUM 40 MG PO TBEC
40.0000 mg | DELAYED_RELEASE_TABLET | Freq: Two times a day (BID) | ORAL | Status: DC
  Administered 2014-10-18 – 2014-10-19 (×2): 40 mg via ORAL
  Filled 2014-10-18 (×2): qty 1

## 2014-10-18 NOTE — Progress Notes (Signed)
Subjective: She says she feels better. She has no new complaints. She had a little bit of loose stool yesterday.  Objective: Vital signs in last 24 hours: Temp:  [98.5 F (36.9 C)-98.7 F (37.1 C)] 98.5 F (36.9 C) (03/28 0617) Pulse Rate:  [64-75] 66 (03/28 0617) Resp:  [20] 20 (03/28 0617) BP: (102-134)/(46-64) 134/63 mmHg (03/28 0617) SpO2:  [95 %-100 %] 95 % (03/28 0617) Weight:  [54.704 kg (120 lb 9.6 oz)] 54.704 kg (120 lb 9.6 oz) (03/28 0500) Weight change: 7.076 kg (15 lb 9.6 oz) Last BM Date: 10/17/14  Intake/Output from previous day: 03/27 0701 - 03/28 0700 In: 360 [P.O.:360] Out: -   PHYSICAL EXAM General appearance: alert, cooperative and Very hard of hearing Resp: clear to auscultation bilaterally Cardio: regular rate and rhythm, S1, S2 normal, no murmur, click, rub or gallop GI: Mild diffuse tenderness with hyperactive bowel sounds Extremities: extremities normal, atraumatic, no cyanosis or edema  Lab Results:  Results for orders placed or performed during the hospital encounter of 10/16/14 (from the past 48 hour(s))  CBC with Differential/Platelet     Status: Abnormal   Collection Time: 10/16/14  7:40 PM  Result Value Ref Range   WBC 9.9 4.0 - 10.5 K/uL   RBC 4.30 3.87 - 5.11 MIL/uL   Hemoglobin 11.2 (L) 12.0 - 15.0 g/dL   HCT 35.6 (L) 36.0 - 46.0 %   MCV 82.8 78.0 - 100.0 fL   MCH 26.0 26.0 - 34.0 pg   MCHC 31.5 30.0 - 36.0 g/dL   RDW 15.5 11.5 - 15.5 %   Platelets 172 150 - 400 K/uL   Neutrophils Relative % 81 (H) 43 - 77 %   Lymphocytes Relative 11 (L) 12 - 46 %   Monocytes Relative 6 3 - 12 %   Eosinophils Relative 0 0 - 5 %   Basophils Relative 2 (H) 0 - 1 %   Band Neutrophils 0 0 - 10 %   Metamyelocytes Relative 0 %   Myelocytes 0 %   Promyelocytes Absolute 0 %   Blasts 0 %   nRBC 0 0 /100 WBC   Neutro Abs 8.0 (H) 1.7 - 7.7 K/uL   Lymphs Abs 1.1 0.7 - 4.0 K/uL   Monocytes Absolute 0.6 0.1 - 1.0 K/uL   Eosinophils Absolute 0.0 0.0 - 0.7  K/uL   Basophils Absolute 0.2 (H) 0.0 - 0.1 K/uL  Comprehensive metabolic panel     Status: Abnormal   Collection Time: 10/16/14  7:40 PM  Result Value Ref Range   Sodium 138 135 - 145 mmol/L   Potassium 4.3 3.5 - 5.1 mmol/L   Chloride 107 96 - 112 mmol/L   CO2 22 19 - 32 mmol/L   Glucose, Bld 128 (H) 70 - 99 mg/dL   BUN 47 (H) 6 - 23 mg/dL   Creatinine, Ser 1.47 (H) 0.50 - 1.10 mg/dL   Calcium 9.0 8.4 - 10.5 mg/dL   Total Protein 6.9 6.0 - 8.3 g/dL   Albumin 3.4 (L) 3.5 - 5.2 g/dL   AST 27 0 - 37 U/L   ALT 13 0 - 35 U/L   Alkaline Phosphatase 111 39 - 117 U/L   Total Bilirubin 0.8 0.3 - 1.2 mg/dL   GFR calc non Af Amer 30 (L) >90 mL/min   GFR calc Af Amer 34 (L) >90 mL/min    Comment: (NOTE) The eGFR has been calculated using the CKD EPI equation. This calculation has not been  validated in all clinical situations. eGFR's persistently <90 mL/min signify possible Chronic Kidney Disease.    Anion gap 9 5 - 15  Basic metabolic panel     Status: Abnormal   Collection Time: 10/17/14  5:54 AM  Result Value Ref Range   Sodium 140 135 - 145 mmol/L   Potassium 4.3 3.5 - 5.1 mmol/L   Chloride 110 96 - 112 mmol/L   CO2 23 19 - 32 mmol/L   Glucose, Bld 86 70 - 99 mg/dL   BUN 42 (H) 6 - 23 mg/dL   Creatinine, Ser 1.41 (H) 0.50 - 1.10 mg/dL   Calcium 8.1 (L) 8.4 - 10.5 mg/dL   GFR calc non Af Amer 31 (L) >90 mL/min   GFR calc Af Amer 36 (L) >90 mL/min    Comment: (NOTE) The eGFR has been calculated using the CKD EPI equation. This calculation has not been validated in all clinical situations. eGFR's persistently <90 mL/min signify possible Chronic Kidney Disease.    Anion gap 7 5 - 15  CBC     Status: Abnormal   Collection Time: 10/17/14  5:54 AM  Result Value Ref Range   WBC 5.6 4.0 - 10.5 K/uL   RBC 3.87 3.87 - 5.11 MIL/uL   Hemoglobin 10.1 (L) 12.0 - 15.0 g/dL   HCT 31.7 (L) 36.0 - 46.0 %   MCV 81.9 78.0 - 100.0 fL   MCH 26.1 26.0 - 34.0 pg   MCHC 31.9 30.0 - 36.0 g/dL    RDW 15.5 11.5 - 15.5 %   Platelets 147 (L) 150 - 400 K/uL  Urinalysis, Routine w reflex microscopic     Status: Abnormal   Collection Time: 10/17/14  6:00 AM  Result Value Ref Range   Color, Urine AMBER (A) YELLOW    Comment: BIOCHEMICALS MAY BE AFFECTED BY COLOR   APPearance CLEAR CLEAR   Specific Gravity, Urine 1.025 1.005 - 1.030   pH 5.5 5.0 - 8.0   Glucose, UA NEGATIVE NEGATIVE mg/dL   Hgb urine dipstick NEGATIVE NEGATIVE   Bilirubin Urine NEGATIVE NEGATIVE   Ketones, ur NEGATIVE NEGATIVE mg/dL   Protein, ur NEGATIVE NEGATIVE mg/dL   Urobilinogen, UA 0.2 0.0 - 1.0 mg/dL   Nitrite NEGATIVE NEGATIVE   Leukocytes, UA TRACE (A) NEGATIVE  Clostridium Difficile by PCR     Status: None   Collection Time: 10/17/14  6:00 AM  Result Value Ref Range   C difficile by pcr NEGATIVE NEGATIVE  Occult blood card to lab, stool RN will collect     Status: Abnormal   Collection Time: 10/17/14  6:00 AM  Result Value Ref Range   Fecal Occult Bld POSITIVE (A) NEGATIVE  Urine microscopic-add on     Status: Abnormal   Collection Time: 10/17/14  6:00 AM  Result Value Ref Range   Squamous Epithelial / LPF FEW (A) RARE   WBC, UA 3-6 <3 WBC/hpf   Bacteria, UA FEW (A) RARE  CBC     Status: Abnormal   Collection Time: 10/17/14  2:29 PM  Result Value Ref Range   WBC 5.8 4.0 - 10.5 K/uL   RBC 3.60 (L) 3.87 - 5.11 MIL/uL   Hemoglobin 9.4 (L) 12.0 - 15.0 g/dL   HCT 29.5 (L) 36.0 - 46.0 %   MCV 81.9 78.0 - 100.0 fL   MCH 26.1 26.0 - 34.0 pg   MCHC 31.9 30.0 - 36.0 g/dL   RDW 15.5 11.5 - 15.5 %   Platelets  127 (L) 150 - 400 K/uL  CBC     Status: Abnormal   Collection Time: 10/17/14 10:27 PM  Result Value Ref Range   WBC 5.2 4.0 - 10.5 K/uL   RBC 3.55 (L) 3.87 - 5.11 MIL/uL   Hemoglobin 9.2 (L) 12.0 - 15.0 g/dL   HCT 29.1 (L) 36.0 - 46.0 %   MCV 82.0 78.0 - 100.0 fL   MCH 25.9 (L) 26.0 - 34.0 pg   MCHC 31.6 30.0 - 36.0 g/dL   RDW 15.4 11.5 - 15.5 %   Platelets 131 (L) 150 - 400 K/uL  CBC      Status: Abnormal   Collection Time: 10/18/14  6:08 AM  Result Value Ref Range   WBC 4.7 4.0 - 10.5 K/uL   RBC 3.50 (L) 3.87 - 5.11 MIL/uL   Hemoglobin 9.0 (L) 12.0 - 15.0 g/dL   HCT 28.7 (L) 36.0 - 46.0 %   MCV 82.0 78.0 - 100.0 fL   MCH 25.7 (L) 26.0 - 34.0 pg   MCHC 31.4 30.0 - 36.0 g/dL   RDW 15.6 (H) 11.5 - 15.5 %   Platelets 136 (L) 150 - 400 K/uL  Basic metabolic panel     Status: Abnormal   Collection Time: 10/18/14  6:12 AM  Result Value Ref Range   Sodium 139 135 - 145 mmol/L   Potassium 4.1 3.5 - 5.1 mmol/L   Chloride 111 96 - 112 mmol/L   CO2 23 19 - 32 mmol/L   Glucose, Bld 84 70 - 99 mg/dL   BUN 25 (H) 6 - 23 mg/dL    Comment: DELTA CHECK NOTED   Creatinine, Ser 1.18 (H) 0.50 - 1.10 mg/dL   Calcium 7.7 (L) 8.4 - 10.5 mg/dL   GFR calc non Af Amer 39 (L) >90 mL/min   GFR calc Af Amer 45 (L) >90 mL/min    Comment: (NOTE) The eGFR has been calculated using the CKD EPI equation. This calculation has not been validated in all clinical situations. eGFR's persistently <90 mL/min signify possible Chronic Kidney Disease.    Anion gap 5 5 - 15    ABGS No results for input(s): PHART, PO2ART, TCO2, HCO3 in the last 72 hours.  Invalid input(s): PCO2 CULTURES Recent Results (from the past 240 hour(s))  Clostridium Difficile by PCR     Status: None   Collection Time: 10/17/14  6:00 AM  Result Value Ref Range Status   C difficile by pcr NEGATIVE NEGATIVE Final   Studies/Results: Dg Abd Acute W/chest  10/16/2014   CLINICAL DATA:  81 year old female with diarrhea for 1 day.  Pain.  EXAM: ACUTE ABDOMEN SERIES (ABDOMEN 2 VIEW & CHEST 1 VIEW)  COMPARISON:  CT 05/29/2013  FINDINGS: Patient is post median sternotomy. The heart is enlarged. There diffuse coarse reticular opacities and may be chronic. There is a moderate hiatal hernia. No dilated bowel loops to suggest obstruction. There is air throughout normal caliber small and large bowel loops in the abdomen. Mild amorphous  appearance of the distal descending and sigmoid colon. No free intra-abdominal air. No radiopaque calculi are seen. There is scoliotic curvature of the spine and bony under mineralization.  IMPRESSION: 1. No bowel obstruction. Mild amorphous appearance of the distal descending and sigmoid colon which is air-filled, can be seen in the setting of colitis. 2. Moderate hiatal hernia. 3. Cardiomegaly.   Electronically Signed   By: Jeb Levering M.D.   On: 10/16/2014 21:36    Medications:  Prior to Admission:  Prescriptions prior to admission  Medication Sig Dispense Refill Last Dose  . amLODipine (NORVASC) 5 MG tablet Take 5 mg by mouth daily after breakfast.    10/16/2014 at Unknown time  . aspirin 325 MG tablet Take 325 mg by mouth every morning.    10/16/2014 at Unknown time  . atorvastatin (LIPITOR) 10 MG tablet Take 10 mg by mouth every morning.    10/16/2014 at Unknown time  . fesoterodine (TOVIAZ) 4 MG TB24 tablet Take 4 mg by mouth daily.   10/16/2014 at Unknown time  . HYDROcodone-acetaminophen (NORCO/VICODIN) 5-325 MG per tablet Take 2 tablets by mouth every 4 (four) hours as needed. 10 tablet 0 Past Month at Unknown time  . lansoprazole (PREVACID) 30 MG capsule Take 1 capsule (30 mg total) by mouth daily. 30 capsule 11 10/16/2014 at Unknown time  . levothyroxine (SYNTHROID, LEVOTHROID) 75 MCG tablet Take 75 mcg by mouth daily before breakfast.   10/16/2014 at Unknown time  . quinapril (ACCUPRIL) 20 MG tablet Take 20 mg by mouth daily after breakfast.    10/16/2014 at Unknown time  . saccharomyces boulardii (FLORASTOR) 250 MG capsule Take 1 capsule (250 mg total) by mouth 2 (two) times daily. 60 capsule 3 10/16/2014 at Unknown time   Scheduled: . amLODipine  5 mg Oral QPC breakfast  . atorvastatin  10 mg Oral q morning - 10a  . ciprofloxacin  200 mg Intravenous Q12H  . fesoterodine  4 mg Oral Daily  . levothyroxine  75 mcg Oral QAC breakfast  . metronidazole  500 mg Intravenous Q8H  .  pantoprazole  40 mg Oral BID  . saccharomyces boulardii  250 mg Oral BID   Continuous:  YOF:[id number] **OR** acetaminophen, HYDROcodone-acetaminophen, ondansetron **OR** ondansetron (ZOFRAN) IV  Assesment: She came to the hospital with diarrhea. She was dehydrated and felt to have acute colitis. She's had some blood in her stools I think associated with a colitis. She is getting better. She is negative for Clostridium difficile. Principal Problem:   Diarrhea Active Problems:   GERD   Dehydration   Acute colitis   HTN (hypertension), benign   Hypothyroidism   ARF (acute renal failure)   Rectal bleeding   Anemia    Plan: CT abdomen and pelvis. GI consult    LOS: 2 days   Amorita Vanrossum L 10/18/2014, 7:56 AM

## 2014-10-18 NOTE — Progress Notes (Signed)
Advanced the patients diet to a full liquid since the patient has been having clear liquids for the past couple meals with minimal complaints.  She has had 2 loose stools on yesterday and one today after contrast for a test, but no complaints of abdominal pain or n/v.  Will continue to moniter the patient.

## 2014-10-18 NOTE — Care Management Note (Addendum)
    Page 2 of 2   10/19/2014     3:23:02 PM CARE MANAGEMENT NOTE 10/19/2014  Patient:  Elizabeth [name],Elizabeth [name]   Account Number:  [id number]  Date Initiated:  10/18/2014  Documentation initiated by:  Sharrie RothmanBLACKWELL,Ayerim Berquist C  Subjective/Objective Assessment:   Pt admitted from home with colitis. Pt lives with her niece and will return home at discharge. Pt stated that she is fairly independent with ADl's. Pt has a cane and stated she only uses it when she goes out.     Action/Plan:   Anticipate discharge within 24 hours. No [name] needs noted.   Anticipated DC Date:  10/19/2014   Anticipated DC Plan:  HOME/SELF CARE      DC Planning Services  [name] consult      [facility]  DURABLE MEDICAL EQUIPMENT   Choice offered to / List presented to:  C-1 Patient   DME arranged  [facility]WALKER - [facility]      DME agency  [facility]     HH arranged  HH-2 PT      [facility]   Status of service:  Completed, signed off Medicare Important Message given?  YES (If response is "NO", the following Medicare IM given date fields will be blank) Date Medicare IM given:  10/19/2014 Medicare IM given by:  Sharrie RothmanBLACKWELL,Jourdin Gens C Date Additional Medicare IM given:   Additional Medicare IM given by:    Discharge Disposition:  HOME W HOME HEALTH SERVICES  Per UR Regulation:    If discussed at Long Length of Stay Meetings, dates discussed:    Comments:  10/19/14 1520 Elizabeth [name], Elizabeth [name] Pt discharged home today with [facility] PT (per pts choice). Alroy BailiffLinda Lothian of [facility] is aware and will collect the pts information from the chart. HH services to start within 48 hours of discharge. [facility] and delivered to pts room prior to discharge. Pt and pts nurse aware of discharge arrangements.  10/18/14 1115 Elizabeth [name], Elizabeth [name]

## 2014-10-18 NOTE — Progress Notes (Signed)
UR chart review completed.  

## 2014-10-18 NOTE — Consult Note (Signed)
Referring Provider: Dr. Juanetta Gosling Primary Care Physician:  Fredirick Maudlin, MD Primary Gastroenterologist:  Dr. Jena Gauss   Date of Admission: 10/16/14 Date of Consultation: 10/18/14  Reason for Consultation: Colitis  HPI:  Elizabeth [name] is a 81 y.o. year old female who presented to the ED on 3/26 with a history of diarrhea, with ED notes stating 5 watery bowel movements with small amount of blood in stools. She is quite talkative and difficult to obtain an accurate history. Hard of hearing. History of Cdiff colitis in 2014, treated with Flagyl. Stool studies negative for Cdiff, stool culture remains in process. Per documentation, 4 loose stools yesterday and one thus far today. Patient denies hematochezia, but ED notes report small amount of blood. Heme positive.  Denies abdominal pain, nausea or vomiting. States no change in diet. Denies recent antibiotic use.    Past Medical History  Diagnosis Date  . Thyroid goiter     s/p surgery now on Synthroid  . B12 deficiency   . Coronary artery disease   . Zenker diverticulum   . Hyperlipidemia   . Hypertension   . Osteoporosis   . GERD (gastroesophageal reflux disease)   . Hypercholesterolemia   . Hypothyroidism   . C. difficile colitis Nov 2014    Treated with Flagyl    Past Surgical History  Procedure Laterality Date  . Esophagogastroduodenoscopy  6/10     Schatzki's ring, otherwise endoscopically normal esophagus, status post dilation (82F), with mucosal disruption through the cervical segment as well as the Schatzki's ring. Hiatal hernia. segment as well as the Schatzki's ring. Hiatal hernia.  . Coronary artery bypass graft      Status Post four-vessel   . Bunionectomy    . Appendectomy    . Abdominal hysterectomy    . Esophagogastroduodenoscopy  09/02/2007    Cervical esophageal web, Schatzki's ring, status post  dilation and disruption as described above. Otherwise, normal esophagus  . Esophagogastroduodenoscopy  August 2011     Zenker's diverticulum, Schatzki's ring s/p dilation, mod to large HH, instructions to pursue BPE if recurrent dysphagia to assess Zenker's  . Esophageal dilation    . Esophagogastroduodenoscopy  03/12/12    Rourk-dilated 88F, cervical web, Schatzki's ring, small HH, small Zenker's diverticulum  . Esophagogastroduodenoscopy N/A 11/26/2013    Dr. Jena Gauss: Zenker's diverticulum, cervical esophageal web and Schatzi's ring s/p dilation, large hiatal hernia, consider ENT referral if persistent dysphagia.   Elease Hashimoto dilation N/A 11/26/2013    Procedure: Elease Hashimoto DILATION;  Surgeon: Corbin Ade, MD;  Location: [facility];  Service: Endoscopy;  Laterality: N/A;    Prior to Admission medications   Medication Sig Start Date End Date Taking? Authorizing Provider  amLODipine (NORVASC) 5 MG tablet Take 5 mg by mouth daily after breakfast.  12/10/11  Yes Historical Provider, MD  aspirin 325 MG tablet Take 325 mg by mouth every morning.    Yes Historical Provider, MD  atorvastatin (LIPITOR) 10 MG tablet Take 10 mg by mouth every morning.  12/10/11  Yes Historical Provider, MD  fesoterodine (TOVIAZ) 4 MG TB24 tablet Take 4 mg by mouth daily.   Yes Historical Provider, MD  HYDROcodone-acetaminophen (NORCO/VICODIN) 5-325 MG per tablet Take 2 tablets by mouth every 4 (four) hours as needed. 04/08/14  Yes Rolland Porter, MD  lansoprazole (PREVACID) 30 MG capsule Take 1 capsule (30 mg total) by mouth daily. 11/26/13  Yes Corbin Ade, MD  levothyroxine (SYNTHROID, LEVOTHROID) 75 MCG tablet Take 75 mcg by  mouth daily before breakfast.   Yes Historical Provider, MD  quinapril (ACCUPRIL) 20 MG tablet Take 20 mg by mouth daily after breakfast.  12/10/11  Yes Historical Provider, MD  saccharomyces boulardii (FLORASTOR) 250 MG capsule Take 1 capsule (250 mg total) by mouth 2 (two) times daily. 06/01/13  Yes [name], MD    Current Facility-Administered Medications  Medication Dose Route Frequency Provider Last Rate  Last Dose  . acetaminophen (TYLENOL) tablet 650 mg  650 mg Oral Q6H PRN Cristal Ford, MD       Or  . acetaminophen (TYLENOL) suppository 650 mg  650 mg Rectal Q6H PRN Cristal Ford, MD      . amLODipine (NORVASC) tablet 5 mg  5 mg Oral QPC breakfast Cristal Ford, MD   5 mg at 10/17/14 0900  . atorvastatin (LIPITOR) tablet 10 mg  10 mg Oral q morning - 10a Cristal Ford, MD   10 mg at 10/17/14 1000  . ciprofloxacin (CIPRO) IVPB 200 mg  200 mg Intravenous Q12H Cristal Ford, MD   200 mg at 10/17/14 2313  . fesoterodine (TOVIAZ) tablet 4 mg  4 mg Oral Daily Cristal Ford, MD   4 mg at 10/17/14 1227  . HYDROcodone-acetaminophen (NORCO/VICODIN) 5-325 MG per tablet 2 tablet  2 tablet Oral Q6H PRN Cristal Ford, MD      . iohexol (OMNIPAQUE) 300 MG/ML solution 50 mL  50 mL Oral Q1 Hr x 2 Medication Radiologist, MD   50 mL at 10/18/14 1023  . levothyroxine (SYNTHROID, LEVOTHROID) tablet 75 mcg  75 mcg Oral QAC breakfast Cristal Ford, MD   75 mcg at 10/17/14 0904  . metroNIDAZOLE (FLAGYL) IVPB 500 mg  500 mg Intravenous Q8H Cristal Ford, MD   500 mg at 10/18/14 0615  . ondansetron (ZOFRAN) tablet 4 mg  4 mg Oral Q6H PRN Cristal Ford, MD       Or  . ondansetron (ZOFRAN) injection 4 mg  4 mg Intravenous Q6H PRN Cristal Ford, MD      . pantoprazole (PROTONIX) EC tablet 40 mg  40 mg Oral BID Cristal Ford, MD   40 mg at 10/17/14 2107  . saccharomyces boulardii (FLORASTOR) capsule 250 mg  250 mg Oral BID Cristal Ford, MD   250 mg at 10/17/14 2107    Allergies as of 10/16/2014  . (No Known Allergies)    Family History  Problem Relation Age of Onset  . Colon cancer Neg Hx     History   Social History  . Marital Status: Widowed    Spouse Name: N/A  . Number of Children: N/A  . Years of Education: N/A   Occupational History  . retired    Social History Main Topics  . Smoking status: Never Smoker   . Smokeless tobacco: Never Used  . Alcohol Use: No  . Drug Use: No  .  Sexual Activity: Not Currently   Other Topics Concern  . Not on file   Social History Narrative    Review of Systems: Negative, limited due to patient's cognitive status. Difficult to keep focused.    Physical Exam: Vital signs in last 24 hours: Temp:  [98.5 F (36.9 C)-98.7 F (37.1 C)] 98.5 F (36.9 C) (03/28 0617) Pulse Rate:  [64-73] 66 (03/28 0617) Resp:  [20] 20 (03/28 0617) BP: (125-134)/(61-64) 134/63 mmHg (03/28 0617) SpO2:  [95 %-100 %] 95 % (03/28 0617) Weight:  [161  lb 9.6 oz (54.704 kg)] 120 lb 9.6 oz (54.704 kg) (03/28 0500) 1 loose stool today General:   Alert,  Well-developed, well-nourished, pleasant and cooperative in NAD. Talkative, difficult to keep focused on history but very pleasant.  Head:  Normocephalic and atraumatic. Eyes:  Sclera clear, no icterus.   Conjunctiva pink. Ears:  Hard of hearing Mouth:  No deformity or lesions, poor dentition.  Lungs:  Clear throughout to auscultation.    Heart:  Regular rate and rhythm; no murmurs Abdomen:  Soft, nontender and nondistended. No masses, hepatosplenomegaly or hernias noted. Normal bowel sounds, without guarding, and without rebound.   Rectal:  Deferred  Msk:  Symmetrical without gross deformities. Normal posture. Extremities:  Without edema. Neurologic:  Alert and  oriented to person and situation Skin:  Intact without significant lesions or rashes. Psych:  Alert and cooperative. Normal mood and affect.  Intake/Output from previous day: 03/27 0701 - 03/28 0700 In: 360 [P.O.:360] Out: -  Intake/Output this shift:    Lab Results:  Recent Labs  10/17/14 1429 10/17/14 2227 10/18/14 0608  WBC 5.8 5.2 4.7  HGB 9.4* 9.2* 9.0*  HCT 29.5* 29.1* 28.7*  PLT 127* 131* 136*   BMET  Recent Labs  10/16/14 1940 10/17/14 0554 10/18/14 0612  NA 138 140 139  K 4.3 4.3 4.1  CL 107 110 111  CO2 22 23 23   GLUCOSE 128* 86 84  BUN 47* 42* 25*  CREATININE 1.47* 1.41* 1.18*  CALCIUM 9.0 8.1* 7.7*    LFT  Recent Labs  10/16/14 1940  PROT 6.9  ALBUMIN 3.4*  AST 27  ALT 13  ALKPHOS 111  BILITOT 0.8   CDIFF PCR: negative  Studies/Results: Dg Abd Acute W/chest  10/16/2014   CLINICAL DATA:  81 year old female with diarrhea for 1 day.  Pain.  EXAM: ACUTE ABDOMEN SERIES (ABDOMEN 2 VIEW & CHEST 1 VIEW)  COMPARISON:  CT 05/29/2013  FINDINGS: Patient is post median sternotomy. The heart is enlarged. There diffuse coarse reticular opacities and may be chronic. There is a moderate hiatal hernia. No dilated bowel loops to suggest obstruction. There is air throughout normal caliber small and large bowel loops in the abdomen. Mild amorphous appearance of the distal descending and sigmoid colon. No free intra-abdominal air. No radiopaque calculi are seen. There is scoliotic curvature of the spine and bony under mineralization.  IMPRESSION: 1. No bowel obstruction. Mild amorphous appearance of the distal descending and sigmoid colon which is air-filled, can be seen in the setting of colitis. 2. Moderate hiatal hernia. 3. Cardiomegaly.   Electronically Signed   By: Rubye OaksMelanie  Ehinger M.D.   On: 10/16/2014 21:36    Impression: 81 year old female admitted with acute onset of diarrhea, history of Cdiff in 2014 but negative Cdiff PCR this admission, already improving with empiric treatment with Cipro and Flagyl. Quite difficult to obtain a thorough history from patient, but per nursing staff and her own reports, she seems to be clinically improving with supportive measures. Small amount of low-volume hematochezia reported but does not appear to be hemodynamically significant. Likely secondary to persistent diarrhea, benign anorectal source. May advance to a low-residue diet as tolerated. If persistent diarrhea without source, would consider colonoscopy while inpatient.   Anemia: likely chronic disease. Will add iron, TIBC, ferritin.   Plan: Follow-up on CT ordered by attending Continue Cipro and Flagyl for  10 days: may convert to oral when diet advanced Follow-up on pending stool culture Check iron studies PPI BID Continue  probiotic Hopeful discharge in next 24-48 hours if continues to improve If persistent/worsening diarrhea without source, would consider colonoscopy while inpatient  Nira Retort, ANP-BC [facility]       LOS: 2 days    10/18/2014, 10:33 AM

## 2014-10-19 LAB — IRON AND TIBC
Iron: 28 ug/dL — ABNORMAL LOW (ref 42–145)
Saturation Ratios: 8 % — ABNORMAL LOW (ref 20–55)
TIBC: 347 ug/dL (ref 250–470)
UIBC: 319 ug/dL (ref 125–400)

## 2014-10-19 LAB — FERRITIN: Ferritin: 18 ng/mL (ref 10–291)

## 2014-10-19 MED ORDER — METRONIDAZOLE 500 MG PO TABS: 500.0000 mg | ORAL_TABLET | Freq: Three times a day (TID) | ORAL | Status: DC

## 2014-10-19 MED ORDER — CIPROFLOXACIN HCL 250 MG PO TABS: 250.0000 mg | ORAL_TABLET | Freq: Two times a day (BID) | ORAL | Status: DC

## 2014-10-19 NOTE — Progress Notes (Signed)
Subj she says she feels okay. She has no other new complaints. She says she is weak. She has no abdominal complaints except for being hungry  Objective: Vital signs in last 24 hours: Temp:  [98.3 F (36.8 C)-98.5 F (36.9 C)] 98.5 F (36.9 C) (03/29 0404) Pulse Rate:  [75-85] 75 (03/29 0404) Resp:  [20] 20 (03/29 0404) BP: (120-142)/(66-73) 142/69 mmHg (03/29 0404) SpO2:  [98 %-100 %] 98 % (03/29 0404) Weight:  [54.502 kg (120 lb 2.5 oz)] 54.502 kg (120 lb 2.5 oz) (03/29 0404) Weight change: -0.202 kg (-7.1 oz) Last BM Date: 10/18/14  Intake/Output from previous day: 03/28 0701 - 03/29 0700 In: 600 [P.O.:600] Out: 800 [Urine:800]  PHYSICAL EXAM General appearance: alert, cooperative and Very hard of hearing Resp: clear to auscultation bilaterally Cardio: regular rate and rhythm, S1, S2 normal, no murmur, click, rub or gallop GI: soft, non-tender; bowel sounds normal; no masses,  no organomegaly Extremities: extremities normal, atraumatic, no cyanosis or edema  Lab Results:  Results for orders placed or performed during the hospital encounter of 10/16/14 (from the past 48 hour(s))  CBC     Status: Abnormal   Collection Time: 10/17/14  2:29 PM  Result Value Ref Range   WBC 5.8 4.0 - 10.5 K/uL   RBC 3.60 (L) 3.87 - 5.11 MIL/uL   Hemoglobin 9.4 (L) 12.0 - 15.0 g/dL   HCT 29.5 (L) 36.0 - 46.0 %   MCV 81.9 78.0 - 100.0 fL   MCH 26.1 26.0 - 34.0 pg   MCHC 31.9 30.0 - 36.0 g/dL   RDW 15.5 11.5 - 15.5 %   Platelets 127 (L) 150 - 400 K/uL  CBC     Status: Abnormal   Collection Time: 10/17/14 10:27 PM  Result Value Ref Range   WBC 5.2 4.0 - 10.5 K/uL   RBC 3.55 (L) 3.87 - 5.11 MIL/uL   Hemoglobin 9.2 (L) 12.0 - 15.0 g/dL   HCT 29.1 (L) 36.0 - 46.0 %   MCV 82.0 78.0 - 100.0 fL   MCH 25.9 (L) 26.0 - 34.0 pg   MCHC 31.6 30.0 - 36.0 g/dL   RDW 15.4 11.5 - 15.5 %   Platelets 131 (L) 150 - 400 K/uL  CBC     Status: Abnormal   Collection Time: 10/18/14  6:08 AM  Result Value  Ref Range   WBC 4.7 4.0 - 10.5 K/uL   RBC 3.50 (L) 3.87 - 5.11 MIL/uL   Hemoglobin 9.0 (L) 12.0 - 15.0 g/dL   HCT 28.7 (L) 36.0 - 46.0 %   MCV 82.0 78.0 - 100.0 fL   MCH 25.7 (L) 26.0 - 34.0 pg   MCHC 31.4 30.0 - 36.0 g/dL   RDW 15.6 (H) 11.5 - 15.5 %   Platelets 136 (L) 150 - 400 K/uL  Basic metabolic panel     Status: Abnormal   Collection Time: 10/18/14  6:12 AM  Result Value Ref Range   Sodium 139 135 - 145 mmol/L   Potassium 4.1 3.5 - 5.1 mmol/L   Chloride 111 96 - 112 mmol/L   CO2 23 19 - 32 mmol/L   Glucose, Bld 84 70 - 99 mg/dL   BUN 25 (H) 6 - 23 mg/dL    Comment: DELTA CHECK NOTED   Creatinine, Ser 1.18 (H) 0.50 - 1.10 mg/dL   Calcium 7.7 (L) 8.4 - 10.5 mg/dL   GFR calc non Af Amer 39 (L) >90 mL/min   GFR calc Af Wyvonnia Lora  45 (L) >90 mL/min    Comment: (NOTE) The eGFR has been calculated using the CKD EPI equation. This calculation has not been validated in all clinical situations. eGFR's persistently <90 mL/min signify possible Chronic Kidney Disease.    Anion gap 5 5 - 15    ABGS No results for input(s): PHART, PO2ART, TCO2, HCO3 in the last 72 hours.  Invalid input(s): PCO2 CULTURES Recent Results (from the past 240 hour(s))  Clostridium Difficile by PCR     Status: None   Collection Time: 10/17/14  6:00 AM  Result Value Ref Range Status   C difficile by pcr NEGATIVE NEGATIVE Final   Studies/Results: Ct Abdomen Pelvis W Contrast  10/18/2014   CLINICAL DATA:  Patient with history of C diff colitis.  Diarrhea.  EXAM: CT ABDOMEN AND PELVIS WITH CONTRAST  TECHNIQUE: Multidetector CT imaging of the abdomen and pelvis was performed using the standard protocol following bolus administration of intravenous contrast.  CONTRAST:  179m OMNIPAQUE IOHEXOL 300 MG/ML  SOLN  COMPARISON:  05/29/2013  FINDINGS: Lower chest: Heart is enlarged. Large hiatal hernia. Ground-glass opacities within the bilateral lower lobes.  Hepatobiliary: Liver is normal in size and contour.  Gallbladder is relatively decompressed. No definite intrahepatic or extrahepatic biliary ductal dilatation. Motion artifact limits evaluation of the liver and gallbladder.  Pancreas: Unremarkable  Spleen: Unremarkable  Adrenals/Urinary Tract: Normal bilateral adrenal glands. Kidneys enhance symmetrically with contrast. The left kidney is mildly atrophic. No hydronephrosis. No ureterolithiasis. Urinary bladder is unremarkable. Sub cm low-attenuation lesion interpolar region right kidney, too small to characterize.  Stomach/Bowel: Oral contrast material is demonstrated to the level of the rectum. The transverse and proximal descending colon is relatively ahaustral. Mild wall thickening of the descending and sigmoid colon. Small amount of surrounding fat stranding involving the distal transverse colon near the splenic flexure. No free intraperitoneal air.  Vascular/Lymphatic: Tortuous abdominal aorta which is otherwise normal in caliber. No retroperitoneal lymphadenopathy.  Other: Post hysterectomy. Moderate amount of free fluid in the pelvis.  Musculoskeletal: Lower thoracic and lumbar spine degenerative changes. No aggressive or acute appearing osseous lesions.  IMPRESSION: Mild wall thickening of the descending and sigmoid colon may be secondary to colitis in the appropriate clinical setting. The colon near the splenic flexure is relatively ahaustral.  Moderate amount of free fluid in the pelvis, greater than that expected for physiologic fluid.  Large hiatal hernia.   Electronically Signed   By: DLovey NewcomerM.D.   On: 10/18/2014 13:18    Medications:  Prior to Admission:  Prescriptions prior to admission  Medication Sig Dispense Refill Last Dose  . amLODipine (NORVASC) 5 MG tablet Take 5 mg by mouth daily after breakfast.    10/16/2014 at Unknown time  . aspirin 325 MG tablet Take 325 mg by mouth every morning.    10/16/2014 at Unknown time  . atorvastatin (LIPITOR) 10 MG tablet Take 10 mg by mouth every  morning.    10/16/2014 at Unknown time  . fesoterodine (TOVIAZ) 4 MG TB24 tablet Take 4 mg by mouth daily.   10/16/2014 at Unknown time  . HYDROcodone-acetaminophen (NORCO/VICODIN) 5-325 MG per tablet Take 2 tablets by mouth every 4 (four) hours as needed. 10 tablet 0 Past Month at Unknown time  . lansoprazole (PREVACID) 30 MG capsule Take 1 capsule (30 mg total) by mouth daily. 30 capsule 11 10/16/2014 at Unknown time  . levothyroxine (SYNTHROID, LEVOTHROID) 75 MCG tablet Take 75 mcg by mouth daily before breakfast.  10/16/2014 at Unknown time  . quinapril (ACCUPRIL) 20 MG tablet Take 20 mg by mouth daily after breakfast.    10/16/2014 at Unknown time  . saccharomyces boulardii (FLORASTOR) 250 MG capsule Take 1 capsule (250 mg total) by mouth 2 (two) times daily. 60 capsule 3 10/16/2014 at Unknown time   Scheduled: . acidophilus  2 capsule Oral Daily  . amLODipine  5 mg Oral QPC breakfast  . atorvastatin  10 mg Oral q morning - 10a  . ciprofloxacin  200 mg Intravenous Q12H  . feeding supplement  237 mL Oral TID BM  . fesoterodine  4 mg Oral Daily  . levothyroxine  75 mcg Oral QAC breakfast  . metronidazole  500 mg Intravenous Q8H  . pantoprazole  40 mg Oral BID AC   Continuous:  YOV:ZCHYIFOYDXAJO **OR** acetaminophen, HYDROcodone-acetaminophen, ondansetron **OR** ondansetron (ZOFRAN) IV  Assesment: She was admitted with diarrhea and dehydration. She had acute renal failure from that that has resolved. The unifying problem seems to be colitis which was demonstrated again on CT yesterday. She is improving. She's not having any diarrhea now. She has a diet ordered.  She complains of weakness which may be multifactorial. I would like PT evaluation to be sure that she does not need a rehabilitation stay.  She has mild anemia and had rectal bleeding felt to be associated with her colitis. Principal Problem:   Diarrhea Active Problems:   GERD   Dehydration   Acute colitis   HTN (hypertension),  benign   Hypothyroidism   ARF (acute renal failure)   Rectal bleeding   Anemia    Plan: I will see how she tolerates her diet today. If she does well with diet through lunch I will probably allow her to be discharged home depending on PT evaluation    LOS: 3 days   Vasiliy Mccarry L 10/19/2014, 8:07 AM

## 2014-10-19 NOTE — Progress Notes (Signed)
Subjective: 5 stools in past 24 hours. Talkative, eating breakfast. Some nausea this morning.   Objective: Vital signs in last 24 hours: Temp:  [98.3 F (36.8 C)-98.5 F (36.9 C)] 98.5 F (36.9 C) (03/29 0404) Pulse Rate:  [75-85] 75 (03/29 0404) Resp:  [20] 20 (03/29 0404) BP: (120-142)/(66-73) 142/69 mmHg (03/29 0404) SpO2:  [98 %-100 %] 98 % (03/29 0404) Weight:  [120 lb 2.5 oz (54.502 kg)] 120 lb 2.5 oz (54.502 kg) (03/29 0404) Last BM Date: 10/18/14 General:   Alert and oriented to person. Talkative.  Head:  Normocephalic and atraumatic. Eyes:  No icterus, sclera clear. Conjuctiva pink.  Abdomen:  Bowel sounds present, soft, non-tender, non-distended. No HSM or hernias noted.  Psych:  Alert and cooperative. Normal mood and affect.  Intake/Output from previous day: 03/28 0701 - 03/29 0700 In: 600 [P.O.:600] Out: 800 [Urine:800] Intake/Output this shift:    Lab Results:  Recent Labs  10/17/14 1429 10/17/14 2227 10/18/14 0608  WBC 5.8 5.2 4.7  HGB 9.4* 9.2* 9.0*  HCT 29.5* 29.1* 28.7*  PLT 127* 131* 136*   BMET  Recent Labs  10/16/14 1940 10/17/14 0554 10/18/14 0612  NA 138 140 139  K 4.3 4.3 4.1  CL 107 110 111  CO2 22 23 23   GLUCOSE 128* 86 84  BUN 47* 42* 25*  CREATININE 1.47* 1.41* 1.18*  CALCIUM 9.0 8.1* 7.7*   LFT  Recent Labs  10/16/14 1940  PROT 6.9  ALBUMIN 3.4*  AST 27  ALT 13  ALKPHOS 111  BILITOT 0.8   Lab Results  Component Value Date   IRON 28* 10/16/2014   TIBC 347 10/16/2014   FERRITIN 18 10/16/2014     Studies/Results: Ct Abdomen Pelvis W Contrast  10/18/2014   CLINICAL DATA:  Patient with history of C diff colitis.  Diarrhea.  EXAM: CT ABDOMEN AND PELVIS WITH CONTRAST  TECHNIQUE: Multidetector CT imaging of the abdomen and pelvis was performed using the standard protocol following bolus administration of intravenous contrast.  CONTRAST:  100mL OMNIPAQUE IOHEXOL 300 MG/ML  SOLN  COMPARISON:  05/29/2013  FINDINGS:  Lower chest: Heart is enlarged. Large hiatal hernia. Ground-glass opacities within the bilateral lower lobes.  Hepatobiliary: Liver is normal in size and contour. Gallbladder is relatively decompressed. No definite intrahepatic or extrahepatic biliary ductal dilatation. Motion artifact limits evaluation of the liver and gallbladder.  Pancreas: Unremarkable  Spleen: Unremarkable  Adrenals/Urinary Tract: Normal bilateral adrenal glands. Kidneys enhance symmetrically with contrast. The left kidney is mildly atrophic. No hydronephrosis. No ureterolithiasis. Urinary bladder is unremarkable. Sub cm low-attenuation lesion interpolar region right kidney, too small to characterize.  Stomach/Bowel: Oral contrast material is demonstrated to the level of the rectum. The transverse and proximal descending colon is relatively ahaustral. Mild wall thickening of the descending and sigmoid colon. Small amount of surrounding fat stranding involving the distal transverse colon near the splenic flexure. No free intraperitoneal air.  Vascular/Lymphatic: Tortuous abdominal aorta which is otherwise normal in caliber. No retroperitoneal lymphadenopathy.  Other: Post hysterectomy. Moderate amount of free fluid in the pelvis.  Musculoskeletal: Lower thoracic and lumbar spine degenerative changes. No aggressive or acute appearing osseous lesions.  IMPRESSION: Mild wall thickening of the descending and sigmoid colon may be secondary to colitis in the appropriate clinical setting. The colon near the splenic flexure is relatively ahaustral.  Moderate amount of free fluid in the pelvis, greater than that expected for physiologic fluid.  Large hiatal hernia.   Electronically Signed  By: Annia Belt M.D.   On: 10/18/2014 13:18    Assessment: 81 year old female with colitis, negative Cdiff PCR and stool culture thus far, improving with supportive measures.   Anemia: low normal ferritin, low iron, likely multifactorial/mixed pattern anemia.    Hopeful discharge today or within 24 hours.     Plan: Cipro and Flagyl for 7 days Continue probiotic PPI BID Hopeful discharge this afternoon or in next 24 hours  Nira Retort, ANP-BC [facility]     LOS: 3 days    10/19/2014, 9:12 AM

## 2014-10-19 NOTE — Progress Notes (Signed)
D/C instructions and paperwork given to patient and patient's niece Mickeal Skinnerhoebe. Patient informed that Rx's were called into [facility] and they needed to be picked up there. IV catheter removed from RIGHT AC, catheter tip intact, no s/s of infection noted. [facility] delivered patient's walker and patient took walker home with her. Patient assisted with dressing into her clothes and transported to vehicle by staff via w/c.

## 2014-10-19 NOTE — Evaluation (Signed)
Physical Therapy Evaluation Patient Details Name: Elizabeth [name] MRN: [id number] DOB: April 03, 1919 Today's Date: 10/19/2014   History of Present Illness  Elizabeth [name] is a 81 y.o. Caucasian female with history of hypothyroidism, anemia, hyperlipidemia, hypertension, GERD, hard of hearing, and history of C. difficile colitis who presents with the above complaints. Patient provided some history, family and that side provided most of the history. Patient started having diarrhea that started at 11 a.m.-12 p.m. She has had approximately 5 watery bowel movements with small amount of blood in her stools. She also has been having some generalized abdominal cramps. Due to ongoing symptoms, she presented to the emergency department for further evaluation. She has been having some chills at home, but denies any fevers. Denies any chest pain, shortness of breath, headaches or vision changes. In the emergency department she was found to be in mild renal insufficiency from dehydration. She was given IV fluids and hospitalist service was asked to admit the patient for further care and management.  Clinical Impression   This is a delightful lady who was seen for evaluation.  She was alert and oriented, able to follow all directions.  Her strength and transfer ability are WNL but her gait with no assistive device or with a cane is unstable.  She normally furniture walks in the home or uses a cane outside the home.  Her standing balance is only fair and she needs a walker to maintain stability.   She states that she fell at home recently and hit her head fairly hard, fortunately sustaining no significant injury.  I recommend that HHPT evaluate her mobility in the home and she should be using a walker for all gait.    Follow Up Recommendations Home health PT    Equipment Recommendations  Rolling walker with 5" wheels    Recommendations for Other Services   none    Precautions / Restrictions  Precautions Precautions: Fall Restrictions Weight Bearing Restrictions: No      Mobility  Bed Mobility Overal bed mobility: Independent                Transfers Overall transfer level: Modified independent Equipment used: None                Ambulation/Gait Ambulation/Gait assistance: Min guard Ambulation Distance (Feet): 250 Feet Assistive device: Rolling walker (2 wheeled);Straight cane;None Gait Pattern/deviations: Narrow base of support   Gait velocity interpretation: at or above normal speed for age/gender General Gait Details: gait tried with no assistive device, cane and then walker...she is unstable with cane and no assistive device due to decreased balance and gait pattern of very narrow base of support...in addition she inverts her right foot during stance..she definately needs to use a walker for gait  Stairs            Wheelchair Mobility    Modified Rankin (Stroke Patients Only)       Balance Overall balance assessment: Needs assistance Sitting-balance support: No upper extremity supported;Feet supported Sitting balance-Leahy Scale: Normal     Standing balance support: No upper extremity supported Standing balance-Leahy Scale: Fair Standing balance comment: unable to tolerate any challenge to stance without hand held assist                             Pertinent Vitals/Pain Pain Assessment: No/denies pain    Home Living Family/patient expects to be discharged to:: Private residence Living Arrangements: Other relatives (  niece) Available Help at Discharge: Family;Available 24 hours/day Type of Home: House Home Access: Stairs to enter Entrance Stairs-Rails: Right Entrance Stairs-Number of Steps: 3-4 Home Layout: One level Home Equipment: Cane - single point      Prior Function Level of Independence: Independent with assistive device(s)         Comments: furniture walks inside of the home, uses a cane when outside  (doesn't go outside very much)     Hand Dominance        Extremity/Trunk Assessment   Upper Extremity Assessment: Overall WFL for tasks assessed           Lower Extremity Assessment: Overall WFL for tasks assessed      Cervical / Trunk Assessment: Kyphotic  Communication   Communication: HOH  Cognition Arousal/Alertness: Awake/alert Behavior During Therapy: WFL for tasks assessed/performed Overall Cognitive Status: Within Functional Limits for tasks assessed                      General Comments      Exercises        Assessment/Plan    PT Assessment All further PT needs can be met in the next venue of care  PT Diagnosis Abnormality of gait   PT Problem List Decreased balance  PT Treatment Interventions     PT Goals (Current goals can be found in the Care Plan section) Acute Rehab PT Goals PT Goal Formulation: All assessment and education complete, DC therapy    Frequency     Barriers to discharge        Co-evaluation               End of Session Equipment Utilized During Treatment: Gait belt Activity Tolerance: Patient tolerated treatment well Patient left: in chair;with call bell/phone within reach;with chair alarm set           Time: 1005-1039 PT Time Calculation (min) (ACUTE ONLY): 34 min   Charges:   PT Evaluation $Initial PT Evaluation Tier I: 1 Procedure     PT G CodesDemetrios Isaacs L 10/19/2014, 10:49 AM

## 2014-10-20 LAB — STOOL CULTURE

## 2014-10-22 NOTE — Discharge Summary (Addendum)
Physician Discharge Summary  Patient ID: Elizabeth [name] MRN: [id number] DOB/AGE: Sep 22, 1918 81 y.o. Primary Care Physician:Brett Darko L, MD Admit date: 10/16/2014 Discharge date: 10/19/2014    Discharge Diagnoses:   Principal Problem:   Diarrhea Active Problems:   GERD   Dehydration   Acute colitis   HTN (hypertension), benign   Hypothyroidism   ARF (acute renal failure)   Rectal bleeding   Anemia     Medication List    TAKE these medications        amLODipine 5 MG tablet  Commonly known as:  NORVASC  Take 5 mg by mouth daily after breakfast.     aspirin 325 MG tablet  Take 325 mg by mouth every morning.     atorvastatin 10 MG tablet  Commonly known as:  LIPITOR  Take 10 mg by mouth every morning.     ciprofloxacin 250 MG tablet  Commonly known as:  CIPRO  Take 1 tablet (250 mg total) by mouth 2 (two) times daily.     fesoterodine 4 MG Tb24 tablet  Commonly known as:  TOVIAZ  Take 4 mg by mouth daily.     HYDROcodone-acetaminophen 5-325 MG per tablet  Commonly known as:  NORCO/VICODIN  Take 2 tablets by mouth every 4 (four) hours as needed.     lansoprazole 30 MG capsule  Commonly known as:  PREVACID  Take 1 capsule (30 mg total) by mouth daily.     levothyroxine 75 MCG tablet  Commonly known as:  SYNTHROID, LEVOTHROID  Take 75 mcg by mouth daily before breakfast.     metroNIDAZOLE 500 MG tablet  Commonly known as:  FLAGYL  Take 1 tablet (500 mg total) by mouth 3 (three) times daily.     quinapril 20 MG tablet  Commonly known as:  ACCUPRIL  Take 20 mg by mouth daily after breakfast.     saccharomyces boulardii 250 MG capsule  Commonly known as:  FLORASTOR  Take 1 capsule (250 mg total) by mouth 2 (two) times daily.        Discharged Condition: Improved    Consults: GI  Significant Diagnostic Studies: Ct Abdomen Pelvis W Contrast  10/18/2014   CLINICAL DATA:  Patient with history of C diff colitis.  Diarrhea.  EXAM: CT ABDOMEN AND  PELVIS WITH CONTRAST  TECHNIQUE: Multidetector CT imaging of the abdomen and pelvis was performed using the standard protocol following bolus administration of intravenous contrast.  CONTRAST:  100mL OMNIPAQUE IOHEXOL 300 MG/ML  SOLN  COMPARISON:  05/29/2013  FINDINGS: Lower chest: Heart is enlarged. Large hiatal hernia. Ground-glass opacities within the bilateral lower lobes.  Hepatobiliary: Liver is normal in size and contour. Gallbladder is relatively decompressed. No definite intrahepatic or extrahepatic biliary ductal dilatation. Motion artifact limits evaluation of the liver and gallbladder.  Pancreas: Unremarkable  Spleen: Unremarkable  Adrenals/Urinary Tract: Normal bilateral adrenal glands. Kidneys enhance symmetrically with contrast. The left kidney is mildly atrophic. No hydronephrosis. No ureterolithiasis. Urinary bladder is unremarkable. Sub cm low-attenuation lesion interpolar region right kidney, too small to characterize.  Stomach/Bowel: Oral contrast material is demonstrated to the level of the rectum. The transverse and proximal descending colon is relatively ahaustral. Mild wall thickening of the descending and sigmoid colon. Small amount of surrounding fat stranding involving the distal transverse colon near the splenic flexure. No free intraperitoneal air.  Vascular/Lymphatic: Tortuous abdominal aorta which is otherwise normal in caliber. No retroperitoneal lymphadenopathy.  Other: Post hysterectomy. Moderate amount of free fluid in the  pelvis.  Musculoskeletal: Lower thoracic and lumbar spine degenerative changes. No aggressive or acute appearing osseous lesions.  IMPRESSION: Mild wall thickening of the descending and sigmoid colon may be secondary to colitis in the appropriate clinical setting. The colon near the splenic flexure is relatively ahaustral.  Moderate amount of free fluid in the pelvis, greater than that expected for physiologic fluid.  Large hiatal hernia.   Electronically Signed    By: Annia Belt M.D.   On: 10/18/2014 13:18   Dg Abd Acute W/chest  10/16/2014   CLINICAL DATA:  81 year old female with diarrhea for 1 day.  Pain.  EXAM: ACUTE ABDOMEN SERIES (ABDOMEN 2 VIEW & CHEST 1 VIEW)  COMPARISON:  CT 05/29/2013  FINDINGS: Patient is post median sternotomy. The heart is enlarged. There diffuse coarse reticular opacities and may be chronic. There is a moderate hiatal hernia. No dilated bowel loops to suggest obstruction. There is air throughout normal caliber small and large bowel loops in the abdomen. Mild amorphous appearance of the distal descending and sigmoid colon. No free intra-abdominal air. No radiopaque calculi are seen. There is scoliotic curvature of the spine and bony under mineralization.  IMPRESSION: 1. No bowel obstruction. Mild amorphous appearance of the distal descending and sigmoid colon which is air-filled, can be seen in the setting of colitis. 2. Moderate hiatal hernia. 3. Cardiomegaly.   Electronically Signed   By: Rubye Oaks M.D.   On: 10/16/2014 21:36    Lab Results: Basic Metabolic Panel: No results for input(s): NA, K, CL, CO2, GLUCOSE, BUN, CREATININE, CALCIUM, MG, PHOS in the last 72 hours. Liver Function Tests: No results for input(s): AST, ALT, ALKPHOS, BILITOT, PROT, ALBUMIN in the last 72 hours.   CBC: No results for input(s): WBC, NEUTROABS, HGB, HCT, MCV, PLT in the last 72 hours.  Recent Results (from the past 240 hour(s))  Clostridium Difficile by PCR     Status: None   Collection Time: 10/17/14  6:00 AM  Result Value Ref Range Status   C difficile by pcr NEGATIVE NEGATIVE Final  Stool culture     Status: None   Collection Time: 10/17/14  6:00 AM  Result Value Ref Range Status   Specimen Description STOOL  Final   Special Requests NONE  Final   Culture   Final    NO SALMONELLA, SHIGELLA, CAMPYLOBACTER, YERSINIA, OR E.COLI [id number] ISOLATED Note: REDUCED NORMAL FLORA PRESENT Performed at [facility]    Report  Status 10/20/2014 FINAL  Final     Hospital Course: This is a 81 year old who came to the emergency department because of diarrhea. She been having diarrhea for several days. She had what appeared to be colitis on x-ray and CT. She was scheduled to have stool studies done but none of these were positive. She was treated with Cipro and Flagyl. GI consultation and improved over the next several days. She was dehydrated on admission and this improved with IV fluids. By the time of discharge she was tolerating a regular diet and had minimal loose stool.  Discharge Exam: Blood pressure 126/65, pulse 75, temperature 98.3 F (36.8 C), temperature source Oral, resp. rate 20, height  (1.651 m), weight 54.502 kg (120 lb 2.5 oz), SpO2 99 %. She is awake and alert. Chest is clear. Heart is regular. Abdomen is soft  Disposition: Home with home health services      Discharge Instructions    Face-to-face encounter (required for Medicare/Medicaid patients)    Complete by:  As directed   I Orlan Aversa L certify that this patient is under my care and that I, or a nurse practitioner or physician's assistant working with me, had a face-to-face encounter that meets the physician face-to-face encounter requirements with this patient on 10/19/2014. The encounter with the patient was in whole, or in part for the following medical condition(s) which is the primary reason for home health care (List medical condition): colitis/weakness  The encounter with the patient was in whole, or in part, for the following medical condition, which is the primary reason for home health care:  colitis/ weakness  I certify that, based on my findings, the following services are medically necessary home health services:   Nursing Physical therapy    Reason for Medically Necessary Home Health Services:  Skilled Nursing- Change/Decline in Patient Status  My clinical findings support the need for the above services:  Unsafe ambulation  due to balance issues  Further, I certify that my clinical findings support that this patient is homebound due to:  Unable to leave home safely without assistance     Home Health    Complete by:  As directed   To provide the following care/treatments:   PT RN             Follow-up Information    Follow up with [facility].   Contact information:   [address] [place] [address] [phone]       Signed: Fredirick Maudlin   10/22/2014, 7:10 AM

## 2014-11-02 ENCOUNTER — Encounter (HOSPITAL_COMMUNITY): Payer: Self-pay | Admitting: Emergency Medicine

## 2014-11-02 ENCOUNTER — Emergency Department (HOSPITAL_COMMUNITY): Payer: Medicare Other

## 2014-11-02 ENCOUNTER — Emergency Department (HOSPITAL_COMMUNITY)
Admission: EM | Admit: 2014-11-02 | Discharge: 2014-11-02 | Disposition: A | Discharge status: Home / Self Care | Payer: Medicare Other | Attending: Emergency Medicine | Admitting: Emergency Medicine

## 2014-11-02 DIAGNOSIS — Z9049 Acquired absence of other specified parts of digestive tract: Secondary | ICD-10-CM | POA: Diagnosis not present

## 2014-11-02 DIAGNOSIS — K219 Gastro-esophageal reflux disease without esophagitis: Secondary | ICD-10-CM | POA: Insufficient documentation

## 2014-11-02 DIAGNOSIS — Z8619 Personal history of other infectious and parasitic diseases: Secondary | ICD-10-CM | POA: Insufficient documentation

## 2014-11-02 DIAGNOSIS — E785 Hyperlipidemia, unspecified: Secondary | ICD-10-CM | POA: Diagnosis not present

## 2014-11-02 DIAGNOSIS — I2510 Atherosclerotic heart disease of native coronary artery without angina pectoris: Secondary | ICD-10-CM | POA: Diagnosis not present

## 2014-11-02 DIAGNOSIS — R531 Weakness: Secondary | ICD-10-CM | POA: Insufficient documentation

## 2014-11-02 DIAGNOSIS — I10 Essential (primary) hypertension: Secondary | ICD-10-CM | POA: Diagnosis not present

## 2014-11-02 DIAGNOSIS — Z792 Long term (current) use of antibiotics: Secondary | ICD-10-CM | POA: Diagnosis not present

## 2014-11-02 DIAGNOSIS — R509 Fever, unspecified: Secondary | ICD-10-CM

## 2014-11-02 DIAGNOSIS — Z951 Presence of aortocoronary bypass graft: Secondary | ICD-10-CM | POA: Insufficient documentation

## 2014-11-02 DIAGNOSIS — Z79899 Other long term (current) drug therapy: Secondary | ICD-10-CM | POA: Insufficient documentation

## 2014-11-02 DIAGNOSIS — R4182 Altered mental status, unspecified: Secondary | ICD-10-CM | POA: Diagnosis present

## 2014-11-02 DIAGNOSIS — E039 Hypothyroidism, unspecified: Secondary | ICD-10-CM | POA: Insufficient documentation

## 2014-11-02 DIAGNOSIS — R5084 Febrile nonhemolytic transfusion reaction: Secondary | ICD-10-CM | POA: Insufficient documentation

## 2014-11-02 DIAGNOSIS — Z7982 Long term (current) use of aspirin: Secondary | ICD-10-CM | POA: Diagnosis not present

## 2014-11-02 LAB — CBC WITH DIFFERENTIAL/PLATELET
BASOS ABS: 0 10*3/uL (ref 0.0–0.1)
Basophils Relative: 1 % (ref 0–1)
Eosinophils Absolute: 0 10*3/uL (ref 0.0–0.7)
Eosinophils Relative: 0 % (ref 0–5)
HEMATOCRIT: 30.9 % — AB (ref 36.0–46.0)
HEMOGLOBIN: 9.6 g/dL — AB (ref 12.0–15.0)
LYMPHS PCT: 23 % (ref 12–46)
Lymphs Abs: 1.5 10*3/uL (ref 0.7–4.0)
MCH: 25.4 pg — ABNORMAL LOW (ref 26.0–34.0)
MCHC: 31.1 g/dL (ref 30.0–36.0)
MCV: 81.7 fL (ref 78.0–100.0)
MONO ABS: 0.8 10*3/uL (ref 0.1–1.0)
MONOS PCT: 13 % — AB (ref 3–12)
Neutro Abs: 4.2 10*3/uL (ref 1.7–7.7)
Neutrophils Relative %: 63 % (ref 43–77)
Platelets: 215 10*3/uL (ref 150–400)
RBC: 3.78 MIL/uL — ABNORMAL LOW (ref 3.87–5.11)
RDW: 16.1 % — AB (ref 11.5–15.5)
WBC: 6.5 10*3/uL (ref 4.0–10.5)

## 2014-11-02 LAB — COMPREHENSIVE METABOLIC PANEL
ALK PHOS: 49 U/L (ref 39–117)
ALT: 12 U/L (ref 0–35)
AST: 22 U/L (ref 0–37)
Albumin: 3.2 g/dL — ABNORMAL LOW (ref 3.5–5.2)
Anion gap: 9 (ref 5–15)
BUN: 30 mg/dL — ABNORMAL HIGH (ref 6–23)
CALCIUM: 8.2 mg/dL — AB (ref 8.4–10.5)
CO2: 23 mmol/L (ref 19–32)
Chloride: 105 mmol/L (ref 96–112)
Creatinine, Ser: 1.13 mg/dL — ABNORMAL HIGH (ref 0.50–1.10)
GFR calc non Af Amer: 41 mL/min — ABNORMAL LOW (ref >90)
GFR, EST AFRICAN AMERICAN: 47 mL/min — AB (ref >90)
GLUCOSE: 105 mg/dL — AB (ref 70–99)
POTASSIUM: 3.9 mmol/L (ref 3.5–5.1)
SODIUM: 137 mmol/L (ref 135–145)
Total Bilirubin: 0.9 mg/dL (ref 0.3–1.2)
Total Protein: 6.8 g/dL (ref 6.0–8.3)

## 2014-11-02 LAB — URINE MICROSCOPIC-ADD ON

## 2014-11-02 LAB — URINALYSIS, ROUTINE W REFLEX MICROSCOPIC
Bilirubin Urine: NEGATIVE
Glucose, UA: NEGATIVE mg/dL
Ketones, ur: NEGATIVE mg/dL
Leukocytes, UA: NEGATIVE
Nitrite: NEGATIVE
Specific Gravity, Urine: 1.015 (ref 1.005–1.030)
Urobilinogen, UA: 0.2 mg/dL (ref 0.0–1.0)
pH: 6.5 (ref 5.0–8.0)

## 2014-11-02 LAB — LACTIC ACID, PLASMA
LACTIC ACID, VENOUS: 1.8 mmol/L (ref 0.5–2.0)
Lactic Acid, Venous: 1.1 mmol/L (ref 0.5–2.0)

## 2014-11-02 MED ORDER — SODIUM CHLORIDE 0.9 % IV BOLUS (SEPSIS)
1000.0000 mL | Freq: Once | INTRAVENOUS | Status: AC
  Administered 2014-11-02: 1000 mL via INTRAVENOUS

## 2014-11-02 NOTE — ED Notes (Signed)
MD at bedside. 

## 2014-11-02 NOTE — Discharge Instructions (Signed)
You were seen today for weakness. There is no obvious source of her weakness and there is no sign or symptom of stroke. You had a low-grade fever of 100.5 orally. Repeat temperatures reassuring. There does not appear to be any infectious source. You should stay with a family member and be monitored closely for the next 24 hours. If you develop worsening symptoms, fever, focal weakness or numbness, you should be reevaluated immediately.  Weakness Weakness is a lack of strength. It may be felt all over the body (generalized) or in one specific part of the body (focal). Some causes of weakness can be serious. You may need further medical evaluation, especially if you are elderly or you have a history of immunosuppression (such as chemotherapy or HIV), kidney disease, heart disease, or diabetes. CAUSES  Weakness can be caused by many different things, including:  Infection.  Physical exhaustion.  Internal bleeding or other blood loss that results in a lack of red blood cells (anemia).  Dehydration. This cause is more common in elderly people.  Side effects or electrolyte abnormalities from medicines, such as pain medicines or sedatives.  Emotional distress, anxiety, or depression.  Circulation problems, especially severe peripheral arterial disease.  Heart disease, such as rapid atrial fibrillation, bradycardia, or heart failure.  Nervous system disorders, such as Guillain-Barr syndrome, multiple sclerosis, or stroke. DIAGNOSIS  To find the cause of your weakness, your caregiver will take your history and perform a physical exam. Lab tests or X-rays may also be ordered, if needed. TREATMENT  Treatment of weakness depends on the cause of your symptoms and can vary greatly. HOME CARE INSTRUCTIONS   Rest as needed.  Eat a well-balanced diet.  Try to get some exercise every day.  Only take over-the-counter or prescription medicines as directed by your caregiver. SEEK MEDICAL CARE IF:     Your weakness seems to be getting worse or spreads to other parts of your body.  You develop new aches or pains. SEEK IMMEDIATE MEDICAL CARE IF:   You cannot perform your normal daily activities, such as getting dressed and feeding yourself.  You cannot walk up and down stairs, or you feel exhausted when you do so.  You have shortness of breath or chest pain.  You have difficulty moving parts of your body.  You have weakness in only one area of the body or on only one side of the body.  You have a fever.  You have trouble speaking or swallowing.  You cannot control your bladder or bowel movements.  You have black or bloody vomit or stools. MAKE SURE YOU:  Understand these instructions.  Will watch your condition.  Will get help right away if you are not doing well or get worse. Document Released: 07/09/2005 Document Revised: 01/08/2012 Document Reviewed: 09/07/2011 [facility] Patient Information 2015 [place], [place]. This information is not intended to replace advice given to you by your health care provider. Make sure you discuss any questions you have with your health care provider.  Fever, Adult A fever is a higher than normal body temperature. In an adult, an oral temperature around 98.6 F (37 C) is considered normal. A temperature of 100.4 F (38 C) or higher is generally considered a fever. Mild or moderate fevers generally have no long-term effects and often do not require treatment. Extreme fever (greater than or equal to 106 F or 41.1 C) can cause seizures. The sweating that may occur with repeated or prolonged fever may cause dehydration. Elderly people  can develop confusion during a fever. A measured temperature can vary with:  Age.  Time of day.  Method of measurement (mouth, underarm, rectal, or ear). The fever is confirmed by taking a temperature with a thermometer. Temperatures can be taken different ways. Some methods are accurate and some are  not.  An oral temperature is used most commonly. Electronic thermometers are fast and accurate.  An ear temperature will only be accurate if the thermometer is positioned as recommended by the manufacturer.  A rectal temperature is accurate and done for those adults who have a condition where an oral temperature cannot be taken.  An underarm (axillary) temperature is not accurate and not recommended. Fever is a symptom, not a disease.  CAUSES   Infections commonly cause fever.  Some noninfectious causes for fever include:  Some arthritis conditions.  Some thyroid or adrenal gland conditions.  Some immune system conditions.  Some types of cancer.  A medicine reaction.  High doses of certain street drugs such as methamphetamine.  Dehydration.  Exposure to high outside or room temperatures.  Occasionally, the source of a fever cannot be determined. This is sometimes called a "fever of unknown origin" (FUO).  Some situations may lead to a temporary rise in body temperature that may go away on its own. Examples are:  Childbirth.  Surgery.  Intense exercise. HOME CARE INSTRUCTIONS   Take appropriate medicines for fever. Follow dosing instructions carefully. If you use acetaminophen to reduce the fever, be careful to avoid taking other medicines that also contain acetaminophen. Do not take aspirin for a fever if you are younger than age 79. There is an association with Reye's syndrome. Reye's syndrome is a rare but potentially deadly disease.  If an infection is present and antibiotics have been prescribed, take them as directed. Finish them even if you start to feel better.  Rest as needed.  Maintain an adequate fluid intake. To prevent dehydration during an illness with prolonged or recurrent fever, you may need to drink extra fluid.Drink enough fluids to keep your urine clear or pale yellow.  Sponging or bathing with room temperature water may help reduce body  temperature. Do not use ice water or alcohol sponge baths.  Dress comfortably, but do not over-bundle. SEEK MEDICAL CARE IF:   You are unable to keep fluids down.  You develop vomiting or diarrhea.  You are not feeling at least partly better after 3 days.  You develop new symptoms or problems. SEEK IMMEDIATE MEDICAL CARE IF:   You have shortness of breath or trouble breathing.  You develop excessive weakness.  You are dizzy or you faint.  You are extremely thirsty or you are making little or no urine.  You develop new pain that was not there before (such as in the head, neck, chest, back, or abdomen).  You have persistent vomiting and diarrhea for more than 1 to 2 days.  You develop a stiff neck or your eyes become sensitive to light.  You develop a skin rash.  You have a fever or persistent symptoms for more than 2 to 3 days.  You have a fever and your symptoms suddenly get worse. MAKE SURE YOU:   Understand these instructions.  Will watch your condition.  Will get help right away if you are not doing well or get worse. Document Released: 01/02/2001 Document Revised: 11/23/2013 Document Reviewed: 05/10/2011 [facility] Patient Information 2015 [place], [place]. This information is not intended to replace advice given to you by  your health care provider. Make sure you discuss any questions you have with your health care provider.

## 2014-11-02 NOTE — ED Notes (Signed)
Per doctor's request patient ambulated in hall with assistance.  Family member stated she used a cane at home.

## 2014-11-02 NOTE — ED Notes (Signed)
Patient with no complaints at this time. Respirations even and unlabored. Skin warm/dry. Discharge instructions reviewed with patient at this time. Patient given opportunity to voice concerns/ask questions. IV removed per policy and band-aid applied to site. Patient discharged at this time and left Emergency Department via wheelchair.  

## 2014-11-02 NOTE — ED Provider Notes (Signed)
CSN: [id number]     Arrival date & time 11/02/14  1011 History  This chart was scribe for Elizabeth Baton, MD by [name], ED Scribe. The patient was seen in room APA14/APA14 and the patient's care was started at 10:20 AM.      Chief Complaint  Patient presents with  . Altered Mental Status   The history is provided by the patient. No language interpreter was used.   HPI Comments: Level 5 Caveat due to AMS.  Elizabeth [name] is a 81 y.o. female with a hx of thyroid goiter, B12 deficiency, CAD, HLD, and HTN who presents to the Emergency Department with AMS onset this morning when her family noticed she was not her normal self. Per EMS report, patient was last known normal last night at approximately 10 PM. Pt reports bilateral knee pain.  Patient is actively crying out. She is oriented 3. She is difficult to obtain history from. Only reporting bilateral knee pain.  Caregivers now the bedside. She states that patient was unable to get up from bed on her own this morning. This is why she called EMS. She has not noted any abnormal behavior and patient is currently at her baseline mental status.  Past Medical History  Diagnosis Date  . Thyroid goiter     s/p surgery now on Synthroid  . B12 deficiency   . Coronary artery disease   . Zenker diverticulum   . Hyperlipidemia   . Hypertension   . Osteoporosis   . GERD (gastroesophageal reflux disease)   . Hypercholesterolemia   . Hypothyroidism   . C. difficile colitis Nov 2014    Treated with Flagyl   Past Surgical History  Procedure Laterality Date  . Esophagogastroduodenoscopy  6/10     Schatzki's ring, otherwise endoscopically normal esophagus, status post dilation (62F), with mucosal disruption through the cervical segment as well as the Schatzki's ring. Hiatal hernia. segment as well as the Schatzki's ring. Hiatal hernia.  . Coronary artery bypass graft      Status Post four-vessel   . Bunionectomy    . Appendectomy    .  Abdominal hysterectomy    . Esophagogastroduodenoscopy  09/02/2007    Cervical esophageal web, Schatzki's ring, status post  dilation and disruption as described above. Otherwise, normal esophagus  . Esophagogastroduodenoscopy  August 2011    Zenker's diverticulum, Schatzki's ring s/p dilation, mod to large HH, instructions to pursue BPE if recurrent dysphagia to assess Zenker's  . Esophageal dilation    . Esophagogastroduodenoscopy  03/12/12    Rourk-dilated 5F, cervical web, Schatzki's ring, small HH, small Zenker's diverticulum  . Esophagogastroduodenoscopy N/A 11/26/2013    Dr. Jena Gauss: Zenker's diverticulum, cervical esophageal web and Schatzi's ring s/p dilation, large hiatal hernia, consider ENT referral if persistent dysphagia.   Elease Hashimoto dilation N/A 11/26/2013    Procedure: Elease Hashimoto DILATION;  Surgeon: Corbin Ade, MD;  Location: [facility];  Service: Endoscopy;  Laterality: N/A;   Family History  Problem Relation Age of Onset  . Colon cancer Neg Hx    History  Substance Use Topics  . Smoking status: Never Smoker   . Smokeless tobacco: Never Used  . Alcohol Use: No   OB History    Gravida Para Term Preterm AB TAB SAB Ectopic Multiple Living            0     Review of Systems  Constitutional: Negative for fever.  Respiratory: Negative for chest tightness and shortness  of breath.   Cardiovascular: Negative for chest pain.  Gastrointestinal: Negative for nausea, vomiting and abdominal pain.  Genitourinary: Negative for dysuria.  Musculoskeletal: Negative for back pain.       Knee pain  Skin: Negative for wound.  Neurological: Positive for weakness. Negative for headaches.  Psychiatric/Behavioral: Negative for confusion.  All other systems reviewed and are negative.     Allergies  Review of patient's allergies indicates no known allergies.  Home Medications   Prior to Admission medications   Medication Sig Start Date End Date Taking? Authorizing Provider   amLODipine (NORVASC) 5 MG tablet Take 5 mg by mouth daily after breakfast.  12/10/11  Yes Historical Provider, MD  aspirin 325 MG tablet Take 325 mg by mouth every morning.    Yes Historical Provider, MD  atorvastatin (LIPITOR) 10 MG tablet Take 10 mg by mouth every morning.  12/10/11  Yes Historical Provider, MD  lansoprazole (PREVACID) 30 MG capsule Take 1 capsule (30 mg total) by mouth daily. 11/26/13  Yes Corbin Ade, MD  levothyroxine (SYNTHROID, LEVOTHROID) 112 MCG tablet Take 112 mcg by mouth daily before breakfast.   Yes Historical Provider, MD  quinapril (ACCUPRIL) 20 MG tablet Take 20 mg by mouth daily after breakfast.  12/10/11  Yes Historical Provider, MD  ciprofloxacin (CIPRO) 250 MG tablet Take 1 tablet (250 mg total) by mouth 2 (two) times daily. Patient not taking: Reported on 11/02/2014 10/19/14   [name], MD  HYDROcodone-acetaminophen (NORCO/VICODIN) 5-325 MG per tablet Take 2 tablets by mouth every 4 (four) hours as needed. Patient not taking: Reported on 11/02/2014 04/08/14   Rolland Porter, MD  metroNIDAZOLE (FLAGYL) 500 MG tablet Take 1 tablet (500 mg total) by mouth 3 (three) times daily. Patient not taking: Reported on 11/02/2014 10/19/14   [name], MD  saccharomyces boulardii (FLORASTOR) 250 MG capsule Take 1 capsule (250 mg total) by mouth 2 (two) times daily. Patient not taking: Reported on 11/02/2014 06/01/13   [name], MD   BP 125/91 mmHg  Pulse 93  Temp(Src) 99.4 F (37.4 C) (Oral)  Resp 16  Ht 5\' 3"  (1.6 m)  Wt 130 lb (58.968 kg)  BMI 23.03 kg/m2  SpO2 97% Physical Exam  Constitutional:  Elderly, anxious appearing, crying out  HENT:  Head: Normocephalic and atraumatic.  Mucous membranes dry  Eyes: Pupils are equal, round, and reactive to light.  Cardiovascular: Normal rate, regular rhythm and normal heart sounds.   No murmur heard. Pulmonary/Chest: Effort normal and breath sounds normal. No respiratory distress. She has no wheezes.   Abdominal: Soft. Bowel sounds are normal. There is no tenderness. There is no rebound and no guarding.  Musculoskeletal: She exhibits no edema.  Neurological: She is alert.  Oriented 3, cranial nerves II through XII intact, follows simple commands, moves all 4 extremity spontaneously and no obvious deficit  Skin: Skin is warm and dry.  Psychiatric:  Agitated  Nursing note and vitals reviewed.   ED Course  Procedures (including critical care time) DIAGNOSTIC STUDIES: Oxygen Saturation is 98% on RA, normal by my interpretation.    COORDINATION OF CARE: 10:24 AM- Pt advised of plan for treatment and pt agrees.    Labs Review Labs Reviewed  CBC WITH DIFFERENTIAL/PLATELET - Abnormal; Notable for the following:    RBC 3.78 (*)    Hemoglobin 9.6 (*)    HCT 30.9 (*)    MCH 25.4 (*)    RDW 16.1 (*)    Monocytes Relative 13 (*)  All other components within normal limits  COMPREHENSIVE METABOLIC PANEL - Abnormal; Notable for the following:    Glucose, Bld 105 (*)    BUN 30 (*)    Creatinine, Ser 1.13 (*)    Calcium 8.2 (*)    Albumin 3.2 (*)    GFR calc non Af Amer 41 (*)    GFR calc Af Amer 47 (*)    All other components within normal limits  URINALYSIS, ROUTINE W REFLEX MICROSCOPIC - Abnormal; Notable for the following:    Hgb urine dipstick LARGE (*)    Protein, ur TRACE (*)    All other components within normal limits  URINE MICROSCOPIC-ADD ON - Abnormal; Notable for the following:    Squamous Epithelial / LPF MANY (*)    Bacteria, UA FEW (*)    All other components within normal limits  URINE CULTURE  CULTURE, BLOOD (ROUTINE X 2)  CULTURE, BLOOD (ROUTINE X 2)  LACTIC ACID, PLASMA  LACTIC ACID, PLASMA    Imaging Review Ct Head Wo Contrast  11/02/2014   CLINICAL DATA:  Altered mental status today.  EXAM: CT HEAD WITHOUT CONTRAST  TECHNIQUE: Contiguous axial images were obtained from the base of the skull through the vertex without intravenous contrast.  COMPARISON:   Head CT scan 04/08/2014.  FINDINGS: Cortical atrophy and chronic microvascular ischemic change are again seen. There is no evidence of acute intracranial abnormality including hemorrhage, infarct, mass lesion, mass effect, midline shift or abnormal extra-axial fluid collection. Remote bilateral cerebellar infarctions are unchanged in appearance. The calvarium is intact.  IMPRESSION: No acute abnormality.  Atrophy, chronic microvascular ischemic change and remote cerebellar infarcts.   Electronically Signed   By: Drusilla Kannerhomas  Dalessio M.D.   On: 11/02/2014 13:09   Dg Chest Portable 1 View  11/02/2014   CLINICAL DATA:  Altered mental status and hypertension  EXAM: PORTABLE CHEST - 1 VIEW  COMPARISON:  October 16, 2014  FINDINGS: There is underlying emphysematous change. There is no edema or consolidation. Heart is slightly enlarged with pulmonary vascularity within normal limits. Patient is status post internal mammary and coronary artery bypass grafting. There is a sizable hiatal type hernia. No bone lesions. No adenopathy.  IMPRESSION: No edema or consolidation. Underlying emphysematous change. Sizable hiatal type hernia.   Electronically Signed   By: Bretta BangWilliam  Woodruff III M.D.   On: 11/02/2014 10:51     EKG Interpretation   Date/Time:  Tuesday November 02 2014 10:17:31 EDT Ventricular Rate:  98 PR Interval:  152 QRS Duration: 76 QT Interval:  336 QTC Calculation: 429 R Axis:   47 Text Interpretation:  Sinus rhythm Probable left atrial enlargement PVCs  no longer present No significant change since last tracing Confirmed by  HORTON  MD, COURTNEY ([id number]) on 11/02/2014 10:27:33 AM      MDM   Final diagnoses:  Weakness  Other specified fever    Patient presents with generalized weakness and reports of confusion. She is alert and oriented 3. She is only reporting knee pain. Rectal temperature is 99.9. Vital signs stable.  Given reports of altered mental status, infectious workup initiated.  No  evidence of urinary tract infection. Chest x-ray is reassuring.  Basic labwork obtained including lactate which is reassuring. When family present at the bedside, they state that she just wouldn't get out of bed this morning. They did not note any focal abnormalities. CT scan of the head is negative and patient is ambulatory at her baseline.  Of note, patient  did have an oral temperature of 100.5 but repeat temperature 98.4 rectally. Low suspicion for acute infection at this time. Discuss with family observing closely for the next 24 hours at home. They're to monitor for altered mental status or signs or symptoms of infection.  After history, exam, and medical workup I feel the patient has been appropriately medically screened and is safe for discharge home. Pertinent diagnoses were discussed with the patient. Patient was given return precautions.   I personally performed the services described in this documentation, which was scribed in my presence. The recorded information has been reviewed and is accurate.   Elizabeth Baton, MD 11/02/14 1535

## 2014-11-02 NOTE — ED Notes (Signed)
Patient arrives via EMS from home with c/o altered mental status that family noticed this morning. Last known normal last night at approximately 2200. C/o neck pain and knee pain.

## 2014-11-03 LAB — URINE CULTURE: Colony Count: >=100000

## 2014-11-07 LAB — CULTURE, BLOOD (ROUTINE X 2)
Culture: NO GROWTH
Culture: NO GROWTH

## 2015-04-20 ENCOUNTER — Ambulatory Visit (INDEPENDENT_AMBULATORY_CARE_PROVIDER_SITE_OTHER): Payer: Medicare Other | Admitting: Gastroenterology

## 2015-04-20 ENCOUNTER — Other Ambulatory Visit: Payer: Self-pay

## 2015-04-20 ENCOUNTER — Encounter: Payer: Self-pay | Admitting: Gastroenterology

## 2015-04-20 VITALS — BP 127/61 | HR 78 | Temp 97.5°F | Ht 64.0 in | Wt 122.6 lb

## 2015-04-20 DIAGNOSIS — R1310 Dysphagia, unspecified: Secondary | ICD-10-CM | POA: Diagnosis not present

## 2015-04-20 DIAGNOSIS — R1314 Dysphagia, pharyngoesophageal phase: Secondary | ICD-10-CM

## 2015-04-20 NOTE — Progress Notes (Signed)
Referring Provider: [name], MD Primary Care Physician:  Fredirick Maudlin, MD  Primary GI: Dr. Jena Gauss   Chief Complaint  Patient presents with  . trouble swallowing    HPI:   Elizabeth [name] is a 81 y.o. female presenting today with a history of recent hospitalization for colitis. History of dysphagia with last EGD in May 2015 noting Zenker's diverticulum, cervical esophageal web and Schatzki's ring s/p dilation, large hiatal hernia. BPE in Nov 2015 with large hiatal hernia, esophageal dysmotility, stable Zenker's diverticulum and prominent cricopharyngeus with 13 mm barium tablet staying at this level for 15-20 minutes. Sept 2015 BPE performed as well. Recommended evaluation by otolaryngologist to evaluate closure of diverticulum. [facility], Dr. Rubye Oaks, who was willing to perform Zenker's diverticulectomy. Nephew was going to discuss with patient and family.   Difficult to swallow pills. Sometimes food is difficult to swallow. Lunch is hard. Sometimes hard in evening. Does well with soup, chicken dumpling, eggs. Patient states she does well when eating small amounts, small bites, soft foods. Lower GI symptoms resolved.   Past Medical History  Diagnosis Date  . Thyroid goiter     s/p surgery now on Synthroid  . B12 deficiency   . Coronary artery disease   . Zenker diverticulum   . Hyperlipidemia   . Hypertension   . Osteoporosis   . GERD (gastroesophageal reflux disease)   . Hypercholesterolemia   . Hypothyroidism   . C. difficile colitis Nov 2014    Treated with Flagyl    Past Surgical History  Procedure Laterality Date  . Esophagogastroduodenoscopy  6/10     Schatzki's ring, otherwise endoscopically normal esophagus, status post dilation (31F), with mucosal disruption through the cervical segment as well as the Schatzki's ring. Hiatal hernia. segment as well as the Schatzki's ring. Hiatal hernia.  . Coronary artery bypass graft      Status Post four-vessel   .  Bunionectomy    . Appendectomy    . Abdominal hysterectomy    . Esophagogastroduodenoscopy  09/02/2007    Cervical esophageal web, Schatzki's ring, status post  dilation and disruption as described above. Otherwise, normal esophagus  . Esophagogastroduodenoscopy  August 2011    Zenker's diverticulum, Schatzki's ring s/p dilation, mod to large HH, instructions to pursue BPE if recurrent dysphagia to assess Zenker's  . Esophageal dilation    . Esophagogastroduodenoscopy  03/12/12    Rourk-dilated 64F, cervical web, Schatzki's ring, small HH, small Zenker's diverticulum  . Esophagogastroduodenoscopy N/A 11/26/2013    Dr. Jena Gauss: Zenker's diverticulum, cervical esophageal web and Schatzi's ring s/p dilation, large hiatal hernia, consider ENT referral if persistent dysphagia.   Elease Hashimoto dilation N/A 11/26/2013    Procedure: Elease Hashimoto DILATION;  Surgeon: Corbin Ade, MD;  Location: [facility];  Service: Endoscopy;  Laterality: N/A;    Current Outpatient Prescriptions  Medication Sig Dispense Refill  . amLODipine (NORVASC) 5 MG tablet Take 5 mg by mouth daily after breakfast.     . aspirin 325 MG tablet Take 325 mg by mouth every morning.     Marland Kitchen atorvastatin (LIPITOR) 10 MG tablet Take 10 mg by mouth every morning.     . ciprofloxacin (CIPRO) 250 MG tablet Take 1 tablet (250 mg total) by mouth 2 (two) times daily. (Patient not taking: Reported on 11/02/2014) 10 tablet 0  . HYDROcodone-acetaminophen (NORCO/VICODIN) 5-325 MG per tablet Take 2 tablets by mouth every 4 (four) hours as needed. (Patient not taking: Reported on 11/02/2014) 10  tablet 0  . lansoprazole (PREVACID) 30 MG capsule Take 1 capsule (30 mg total) by mouth daily. 30 capsule 11  . levothyroxine (SYNTHROID, LEVOTHROID) 112 MCG tablet Take 112 mcg by mouth daily before breakfast.    . metroNIDAZOLE (FLAGYL) 500 MG tablet Take 1 tablet (500 mg total) by mouth 3 (three) times daily. (Patient not taking: Reported on 11/02/2014) 15 tablet 0  .  quinapril (ACCUPRIL) 20 MG tablet Take 20 mg by mouth daily after breakfast.     . saccharomyces boulardii (FLORASTOR) 250 MG capsule Take 1 capsule (250 mg total) by mouth 2 (two) times daily. (Patient not taking: Reported on 11/02/2014) 60 capsule 3   No current facility-administered medications for this visit.    Allergies as of 04/20/2015  . (No Known Allergies)    Family History  Problem Relation Age of Onset  . Colon cancer Neg Hx     Social History   Social History  . Marital Status: Widowed    Spouse Name: N/A  . Number of Children: N/A  . Years of Education: N/A   Occupational History  . retired    Social History Main Topics  . Smoking status: Never Smoker   . Smokeless tobacco: Never Used  . Alcohol Use: No  . Drug Use: No  . Sexual Activity: Not Currently   Other Topics Concern  . None   Social History Narrative    Review of Systems: As mentioned in HPI   Physical Exam: BP 127/61 mmHg  Pulse 78  Temp(Src) 97.5 F (36.4 C)  Ht  (1.626 m)  Wt 122 lb 9.6 oz (55.611 kg)  BMI 21.03 kg/m2 General:   Alert and oriented. No distress noted. Pleasant and cooperative.  Head:  Normocephalic and atraumatic. Heart:  S1, S2 present without murmurs Abdomen:  +BS, soft, non-tender and non-distended. No rebound or guarding. No HSM or masses noted. Msk:  Mild to moderate kyphosis  Extremities:  Without edema. Neurologic:  Alert and  oriented x4;  grossly normal neurologically. Psych:  Alert and cooperative. Normal mood and affect.

## 2015-04-20 NOTE — Assessment & Plan Note (Signed)
81 year old female with dysphagia multifactorial in the setting of known Zenker's diverticulum, dysmotility. EGD on file from May 2015, with BPE X 2 late last fall. Limited options from our standpoint. It appears the family may be somewhat confused regarding diet. I have counseled again on a dysphagia diet, soft diet, with sitting upright at meals and thereafter, chewing well, small bites. Patient does well with softer textures. I spent a large amount of time during the visit going over the food choices and WHY she should avoid foods that are troublesome. I have recommended referral back to Dr. Rubye Oaks if patient and family do desire surgery. The benefits may not outweigh the risks, but this can be discussed at [facility]. Refer back to [place].

## 2015-04-20 NOTE — Patient Instructions (Addendum)
We have referred you back to Dr. Rubye Oaks at [facility].   Eat small amounts, take small bites, chew well. Sit upright after eating for at least 45 minutes.   Please follow the diet attached below. DO NOT EAT HARD, TOUGH textures.   Dysphagia Level 2 Diet, Mechanically Altered The dysphagia level 2 diet includes foods that are blended, chopped, ground, or mashed so they are easier to chew and swallow. The foods are soft, moist, and can be chopped into -inch chunks (such as pancakes, pasta, and bananas). In order to be on this diet, you must be able to chew. This diet helps you transition between the pureed textures of the dysphagia level 1 diet to more solid textures. This diet is helpful for people with mild to moderate swallowing difficulties. It reduces the risk of food getting caught in the windpipe, trachea, or lungs.  You may need help or supervision during meals while following this diet so that you eat safely. You will be on this diet until your health care provider advances the texture of your diet.  WHAT DO I NEED TO KNOW ABOUT THIS DIET? Foods  You may eat foods that are soft and moist.  You may need to use a blender, whisk, or masher to soften some of your foods.  You can moisten foods with gravies, sauces, vegetable or fruit juice, milk, half and half, or water when blending, mashing, or grinding your foods to the right consistency.  If you were on the dysphagia level 1 diet, you may still eat any of the foods included in that diet.  Avoid foods that are dry, hard, sticky, chewy, coarse, and crunchy. Also avoid large cuts of food.  Take small bites. Each bite should contain  inch or less of food. Liquids  Avoid liquids with seeds and chunks.  Thicken liquids, if instructed by your health care provider. Your health care provider will tell you the consistency to which you should thicken your liquids for safe swallowing. To thicken a liquid, use a commercial thickener or a  thickening food (such as rice cereal or potato flakes). Ask your health care provider for specific recommendations on thickeners. See your dietitian or health care provider regularly for help with your dietary changes. WHAT FOODS CAN I EAT? Grains Store-bought soft breads that do not have nuts or seeds. Pancakes, sweet rolls, [place] pastries, and [place] toast that have been moistened with syrup or sauce to form a slurry when blended. Well-cooked pasta, noodles, and bread dressing. Well-cooked noodles and pasta in sauce. Moist macaroni and cheese. Soft dumplings or spaetzle with gravy or butter. Cooked cereals (including oatmeal). Low-texture dry cereals, such as rice puff, corn, or wheat-flake cereals, with milk (if thin liquids are not allowed, make sure all of the milk is absorbed by the cereal before eating it). Vegetables Very soft, well-cooked vegetables in pieces less than  inch in size. Cooked potatoes that are moist, not crispy, and with sauce. Fruits Canned or cooked fruits that are soft or moist and do not have skin or seeds. Fresh, soft bananas. Fruit juices with a small amount of pulp (if thin liquids are allowed). Gelatin or plain gelatin with canned fruit, except pineapple. Meat and Other Protein Sources Tender, moist meats, poultry, or fish cooked with gravy or sauce and cubed to -inch bites or smaller. Ground meat. Moist meatball or meatloaf. Fish without bones. Moist casseroles without rice. Tuna, egg, or meat salad without chunks or hard-to-chew vegetables, such as celery and  onions. Smooth quiche without large chunks. Scrambled, poached, or soft-cooked eggs with butter, margarine, sauce, or gravy. Tofu. Well-cooked, moistened and mashed beans, peas, baked beans, and other legumes. Casseroles without rice (such as tuna noodle casserole or soft moist meat lasagna). Dairy Cream cheese. Yogurt. Cottage cheese. Ask your health care provider if milk is allowed. Sweets/Desserts Pudding.  Custard. Soft fruit pies with crust on the bottom only. Crisps and cobblers without seeds or nuts and with soft crusts. Soft, moist cakes. Icing. Pre-gelled cookies. Soft, moist cookies dunked in milk, coffee, or another liquid. Jelly. Soft, smooth chocolate bars that are easily chewed. Jams and preserves without seeds. Ask your health care provider whether you can have frozen desserts. Fats and Oils Butter. Margarine. Cream for cereal, depending on liquid consistency allowed. Gravy. Cream sauces. Mayonnaise. Salad dressings. Cream cheese. Cheese spreads, plain or with soft fruits or vegetables added. Sour cream. Sour cream dips with soft fruits or vegetables added. Whipped toppings. Other Sauces and salsas that have soft chunks that are about  inch or smaller. The items listed above may not be a complete list of recommended foods or beverages. Contact your dietitian for more options. WHAT FOODS ARE NOT RECOMMENDED? Grains All breads not listed in the recommended list. Breads that are hard or have nuts or seeds. Coarse cereals. Cereals that have nuts, seeds, dried fruits, or coconut. Rice. Corn. Vegetables Whole, raw, frozen, or dried vegetables. Tough, fibrous, chewy, or stringy cooked vegetables, such as celery, peas, broccoli, cabbage, Brussels sprouts, and asparagus. Potato skins. Potato and other vegetable chips. Fried or French-fried potatoes. Cooked corn and peas. Fruits Whole raw, frozen, or dried fruits, including coconut. Pineapple. Fruits with seeds. Meat and Other Protein Sources Dry, tough meats, such as bacon, sausage, and hot dogs. Cheese slices and cubes. Peanut butter. Hard boiled or fried eggs. Nuts. Seeds. Pizza. Sandwiches. Dry casseroles or casseroles with rice or large chunks. Dairy Yogurt with nuts, seeds, or large chunks. Sweets/Desserts Coarse, hard, chewy, or sticky desserts. Any dessert with nuts, seeds, coconut, pineapple, or dried fruit. Ask your health care provider  whether you can have frozen desserts. Fats and Oils Avoid fats with chunky, large textures, such as those with nuts or fruits. Other Soups and casseroles with large chunks. The items listed above may not be a complete list of foods and beverages to avoid. Contact your dietitian for more information. Document Released: 07/09/2005 Document Revised: 03/11/2013 Document Reviewed: 06/22/2013 [facility] Patient Information 2015 [place], [place]. This information is not intended to replace advice given to you by your health care provider. Make sure you discuss any questions you have with your health care provider.

## 2015-04-20 NOTE — Progress Notes (Signed)
CC'D TO PCP °

## 2015-11-10 ENCOUNTER — Ambulatory Visit (INDEPENDENT_AMBULATORY_CARE_PROVIDER_SITE_OTHER): Payer: Medicare Other | Admitting: Otolaryngology

## 2015-11-11 IMAGING — CR DG CHEST 1V PORT
1 series · 2 of 2 positions shown · non-contrast
Comparison: October 16, 2014

CLINICAL DATA: Altered mental status and hypertension

EXAM:
PORTABLE CHEST - 1 VIEW

[Series 1: ap portable · 0.17mm/px · 2 of 2 slices shown]
[im 1/2]
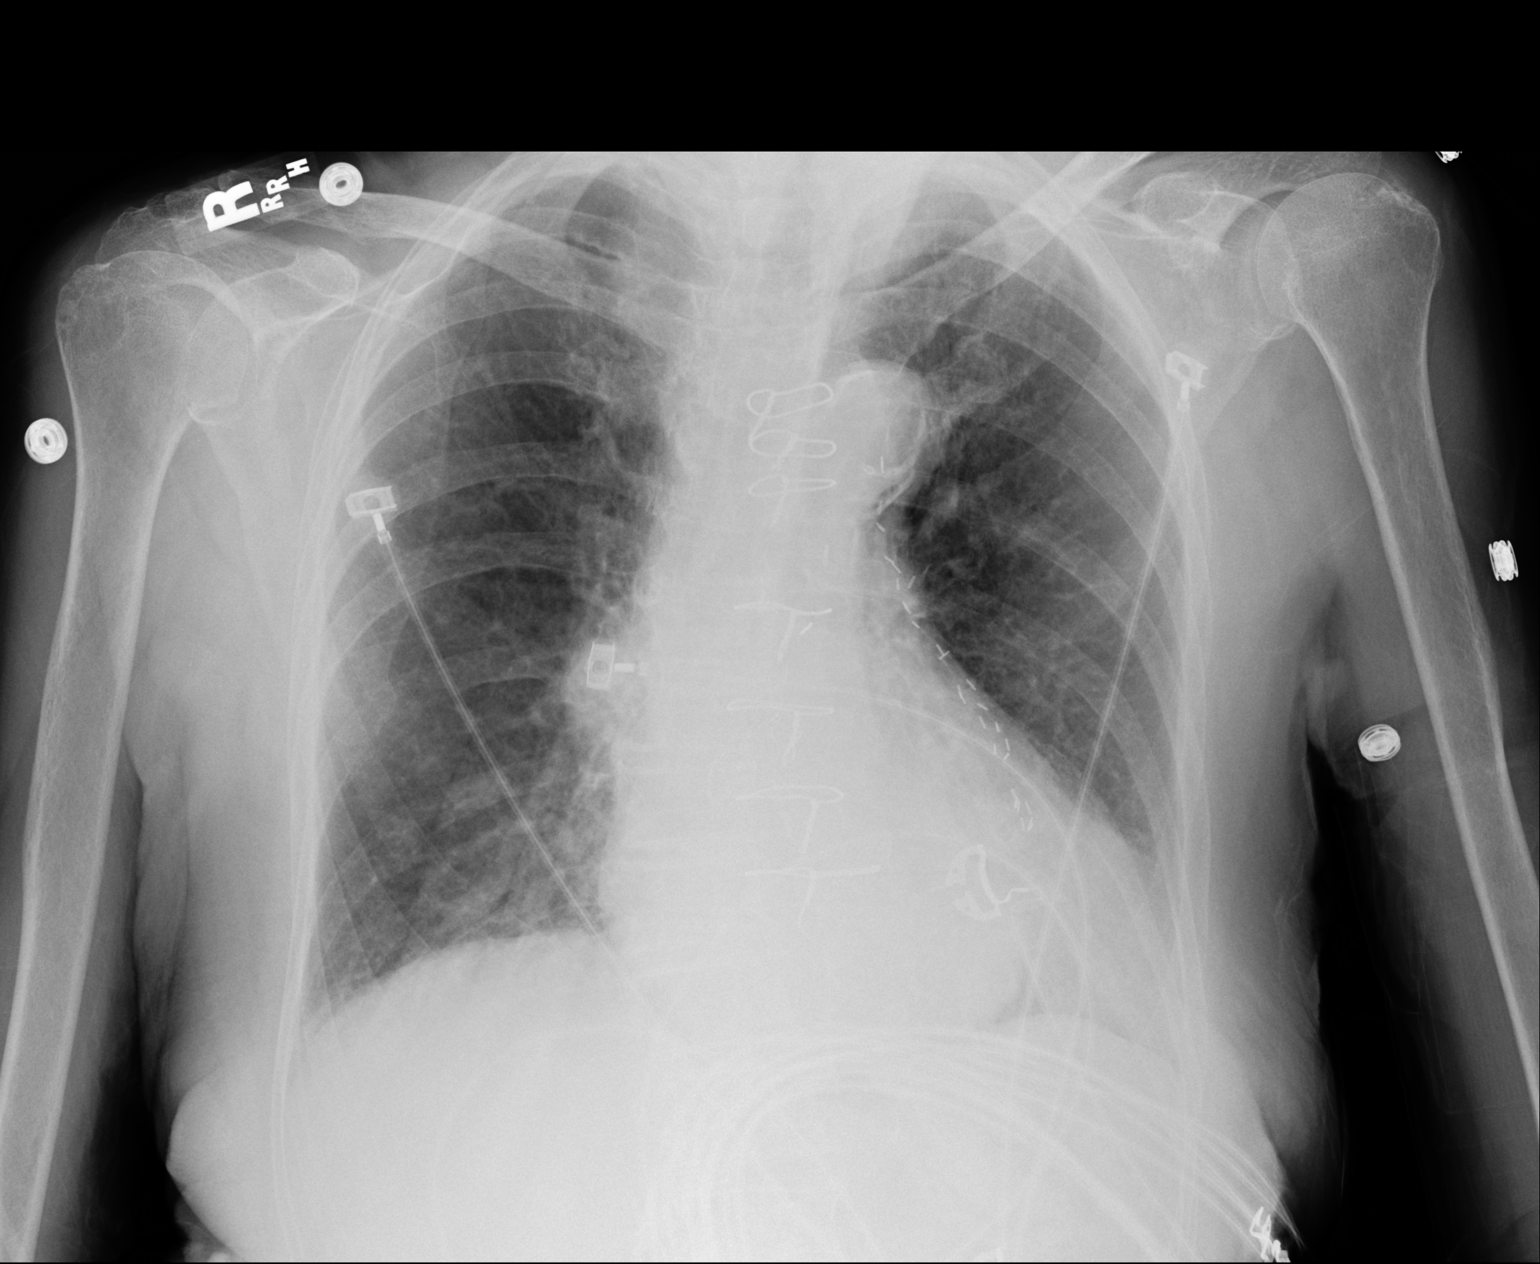
[im 2/2]
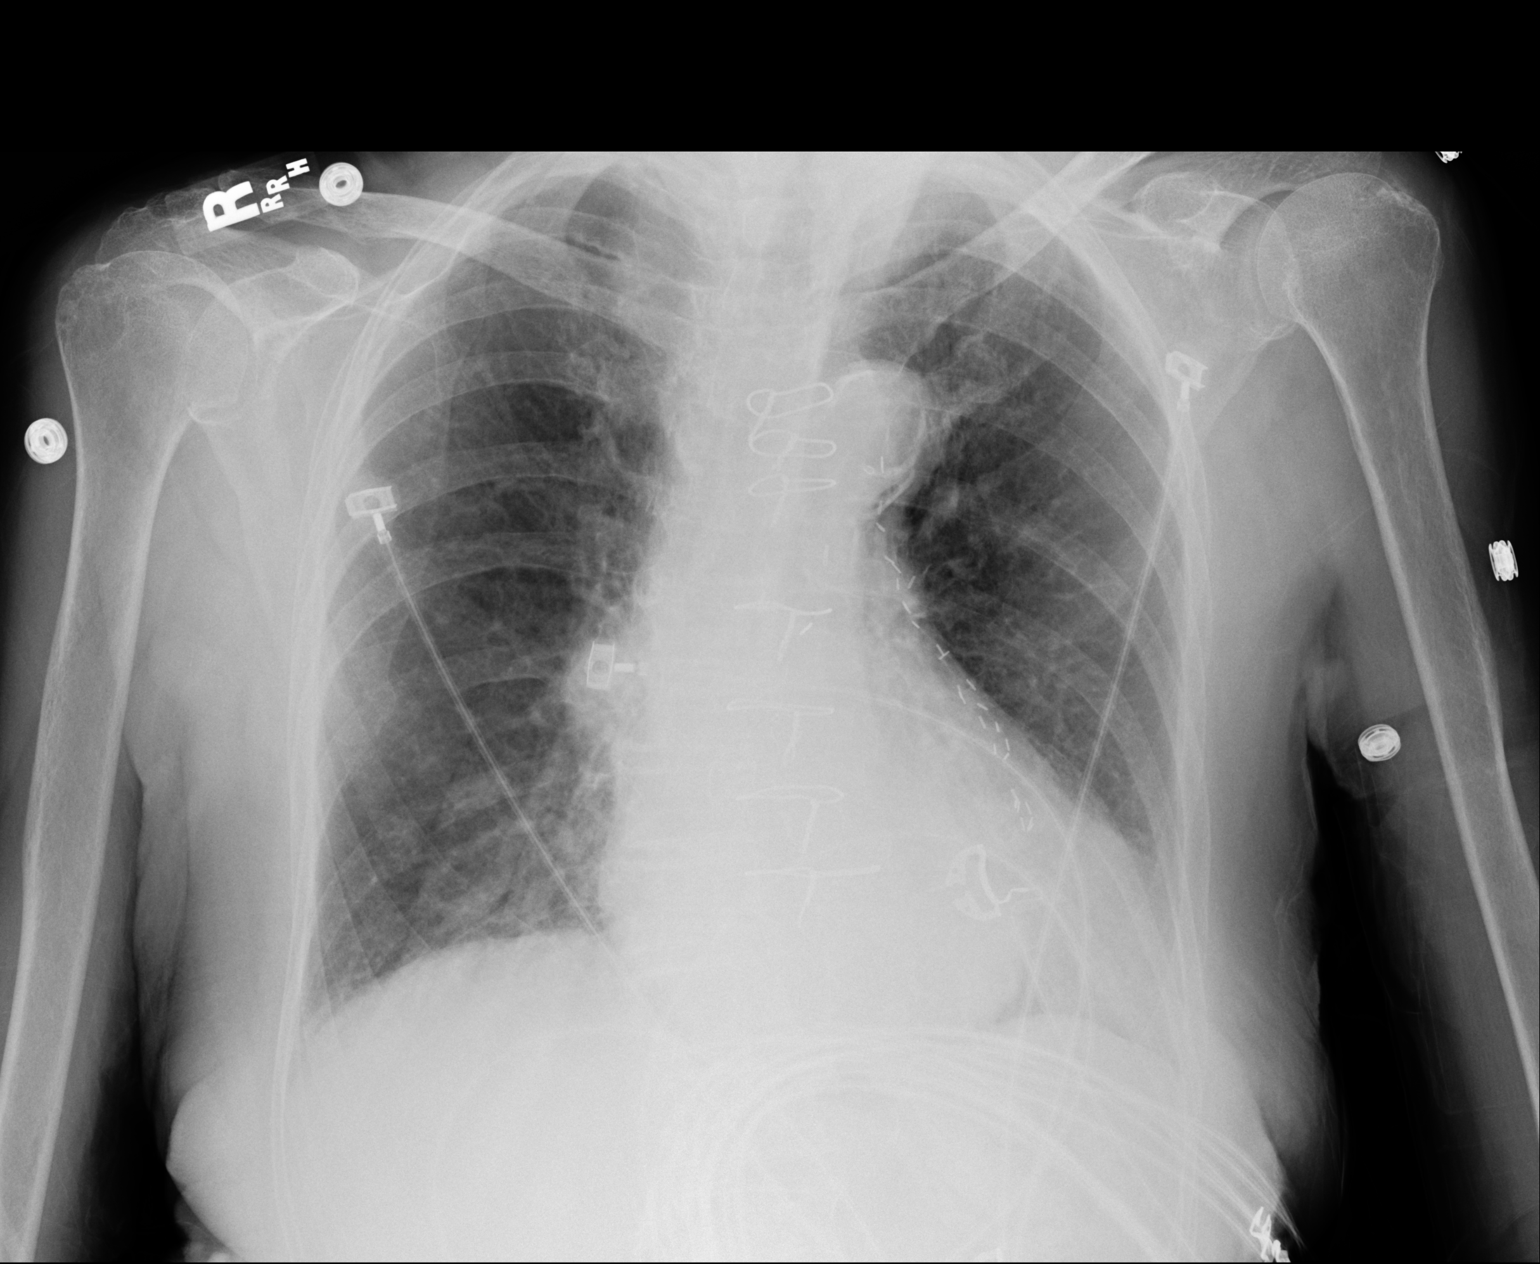

[2 of 2 positions shown; findings below may reference images not displayed]

FINDINGS: There is underlying emphysematous change. There is no edema or
consolidation. Heart is slightly enlarged with pulmonary vascularity
within normal limits. Patient is status post internal mammary and
coronary artery bypass grafting. There is a sizable hiatal type
hernia. No bone lesions. No adenopathy.
IMPRESSION: No edema or consolidation. Underlying emphysematous change. Sizable
hiatal type hernia.

## 2015-11-11 IMAGING — CT CT HEAD W/O CM
1 of 5 series · 11 of 30 positions shown, 14 images · non-contrast
Comparison: Head CT scan 04/08/2014.

CLINICAL DATA: Altered mental status today.

EXAM:
CT HEAD WITHOUT CONTRAST
TECHNIQUE: Contiguous axial images were obtained from the base of the skull
through the vertex without intravenous contrast.

[Series 5: headtrauma 2.4 h60s · axial · 0.45mm/px · z∈[+98,+248]mm · 11 of 72 slices shown, 14 images]
[im 6/72  brain]
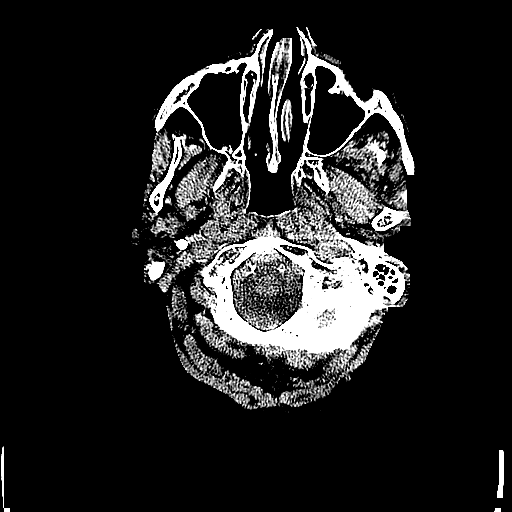
[im 6/72  bone]
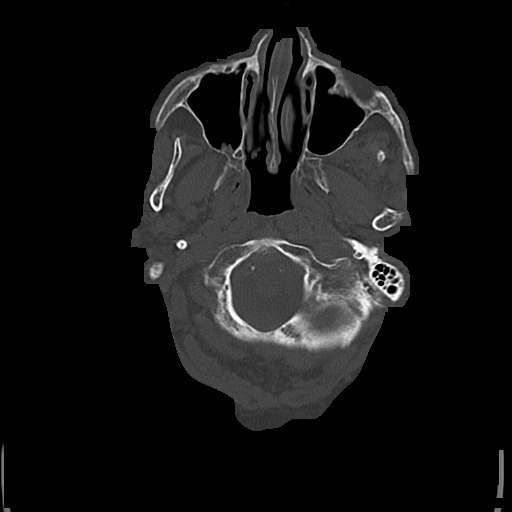
[im 12/72  brain]
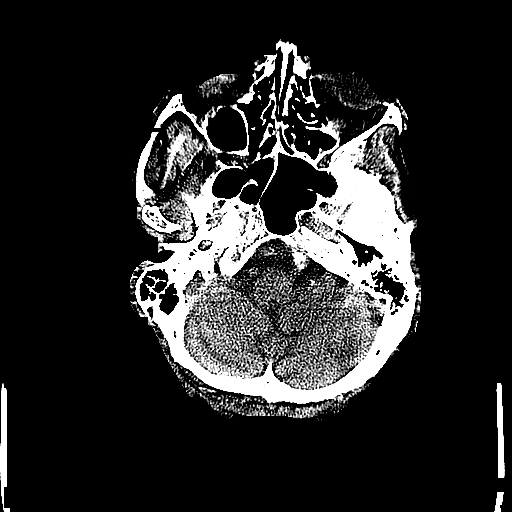
[im 18/72  brain]
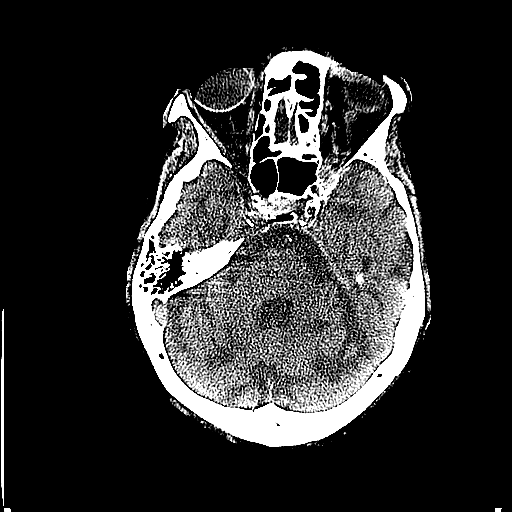
[im 24/72  brain]
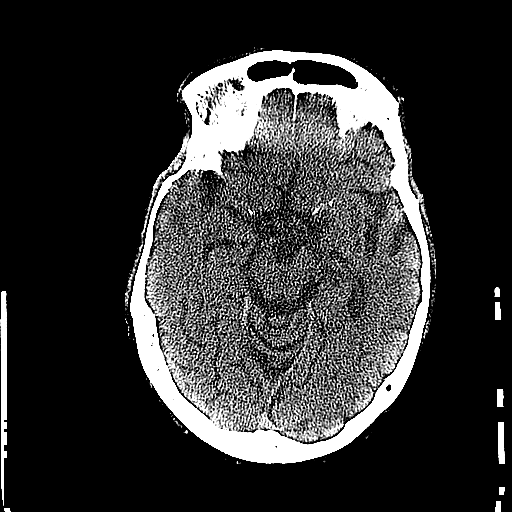
[im 30/72  brain]
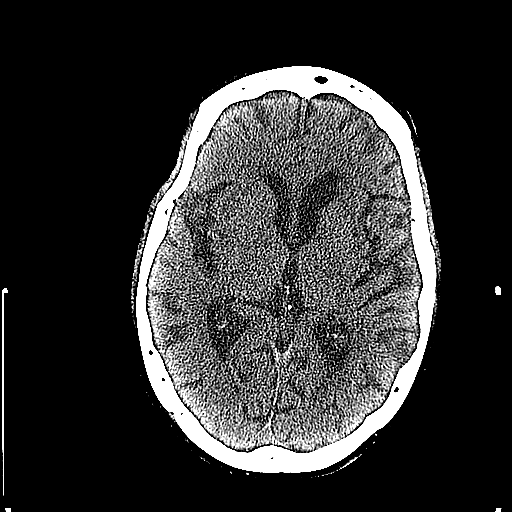
[im 30/72  bone]
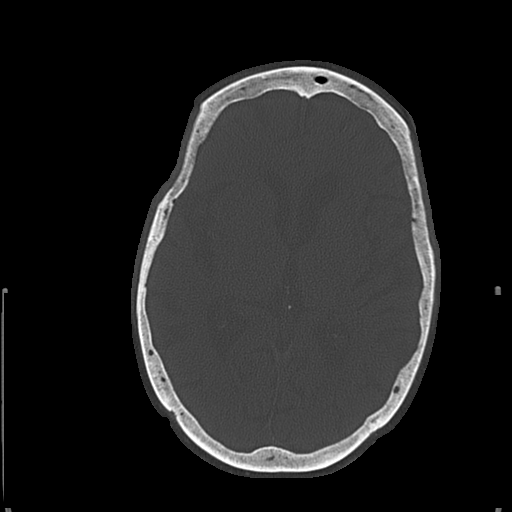
[im 36/72  brain]
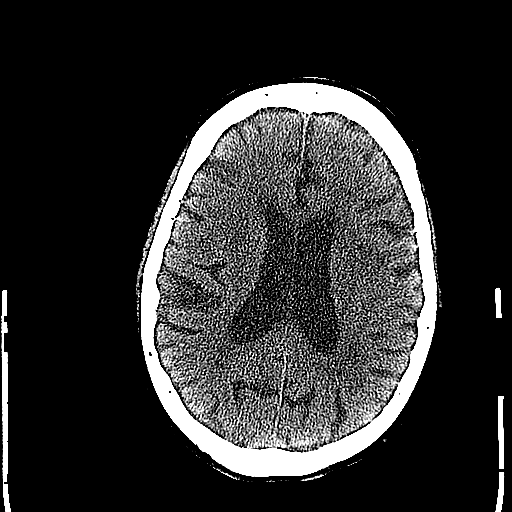
[im 42/72  brain]
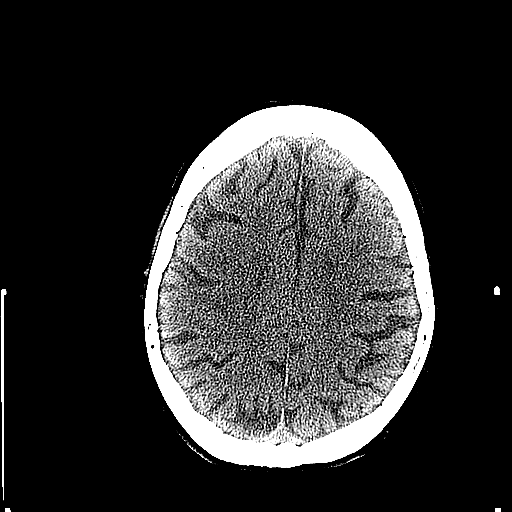
[im 48/72  brain]
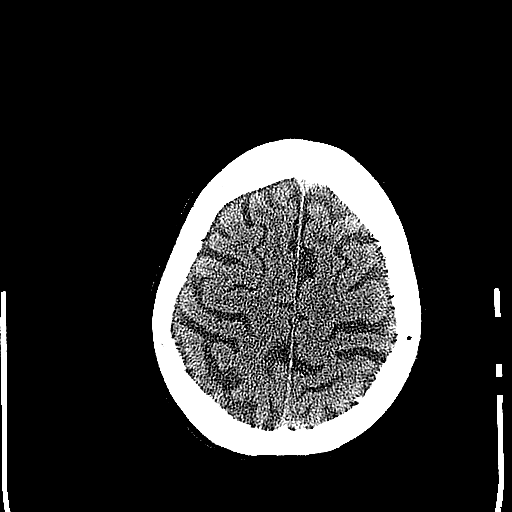
[im 54/72  brain]
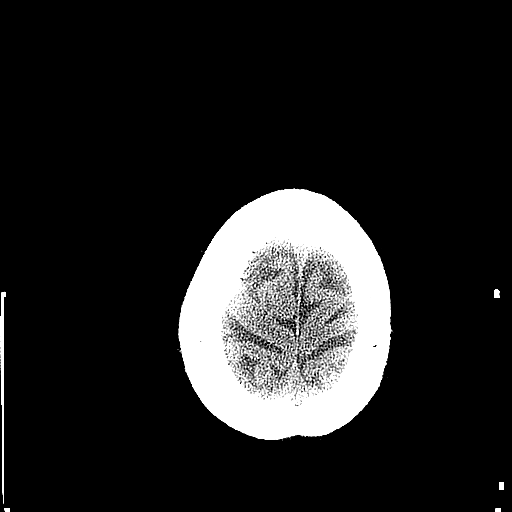
[im 54/72  bone]
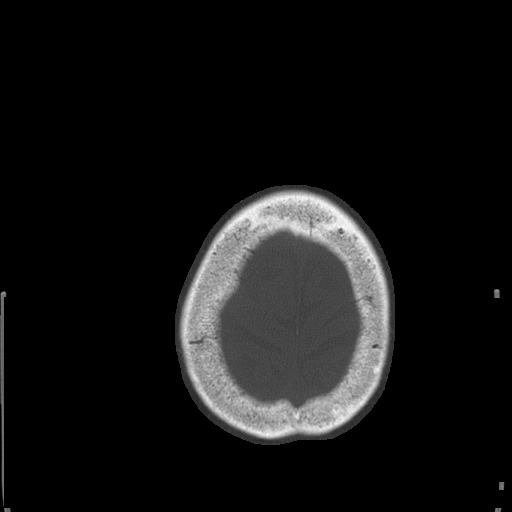
[im 60/72  brain]
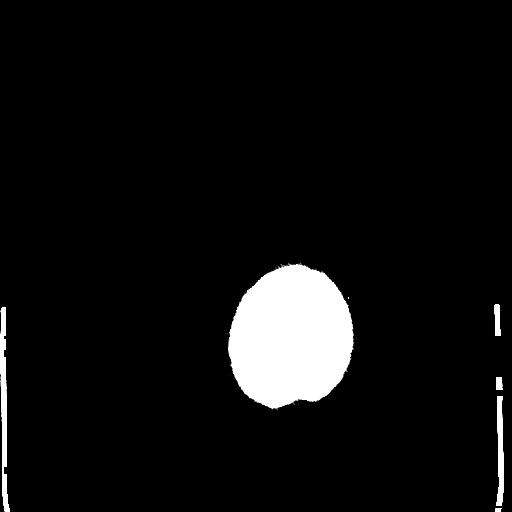
[im 66/72  brain]
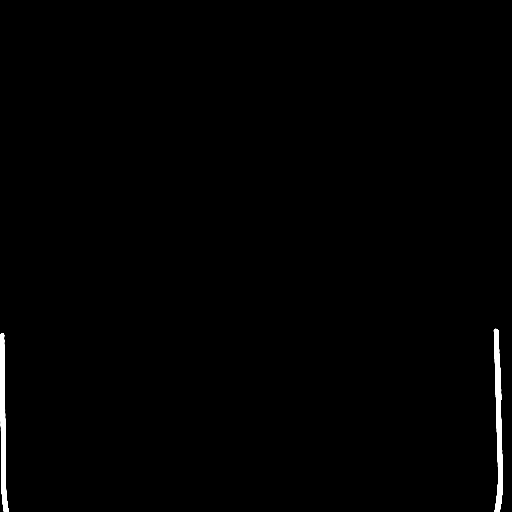

[11 of 30 positions shown; findings below may reference images not displayed]

FINDINGS: Cortical atrophy and chronic microvascular ischemic change are again
seen. There is no evidence of acute intracranial abnormality
including hemorrhage, infarct, mass lesion, mass effect, midline
shift or abnormal extra-axial fluid collection. Remote bilateral
cerebellar infarctions are unchanged in appearance. The calvarium is
intact.
IMPRESSION: No acute abnormality.

Atrophy, chronic microvascular ischemic change and remote cerebellar
infarcts.

## 2016-05-24 DIAGNOSIS — Z23 Encounter for immunization: Secondary | ICD-10-CM | POA: Diagnosis not present

## 2016-06-28 DIAGNOSIS — I739 Peripheral vascular disease, unspecified: Secondary | ICD-10-CM | POA: Diagnosis not present

## 2016-07-21 ENCOUNTER — Encounter (HOSPITAL_COMMUNITY): Payer: Self-pay | Admitting: *Deleted

## 2016-07-21 ENCOUNTER — Inpatient Hospital Stay (HOSPITAL_COMMUNITY)
Admission: EM | Admit: 2016-07-21 | Discharge: 2016-07-25 | DRG: 395 | Disposition: A | Discharge status: Home Health | Payer: Medicare Other | Attending: Pulmonary Disease | Admitting: Pulmonary Disease

## 2016-07-21 ENCOUNTER — Emergency Department (HOSPITAL_COMMUNITY): Payer: Medicare Other

## 2016-07-21 DIAGNOSIS — E538 Deficiency of other specified B group vitamins: Secondary | ICD-10-CM | POA: Diagnosis not present

## 2016-07-21 DIAGNOSIS — K219 Gastro-esophageal reflux disease without esophagitis: Secondary | ICD-10-CM | POA: Diagnosis present

## 2016-07-21 DIAGNOSIS — D638 Anemia in other chronic diseases classified elsewhere: Secondary | ICD-10-CM | POA: Diagnosis not present

## 2016-07-21 DIAGNOSIS — I10 Essential (primary) hypertension: Secondary | ICD-10-CM | POA: Diagnosis not present

## 2016-07-21 DIAGNOSIS — K529 Noninfective gastroenteritis and colitis, unspecified: Secondary | ICD-10-CM | POA: Diagnosis present

## 2016-07-21 DIAGNOSIS — R1084 Generalized abdominal pain: Secondary | ICD-10-CM | POA: Diagnosis not present

## 2016-07-21 DIAGNOSIS — E039 Hypothyroidism, unspecified: Secondary | ICD-10-CM | POA: Diagnosis present

## 2016-07-21 DIAGNOSIS — R1030 Lower abdominal pain, unspecified: Secondary | ICD-10-CM

## 2016-07-21 DIAGNOSIS — Z951 Presence of aortocoronary bypass graft: Secondary | ICD-10-CM

## 2016-07-21 DIAGNOSIS — D500 Iron deficiency anemia secondary to blood loss (chronic): Secondary | ICD-10-CM

## 2016-07-21 DIAGNOSIS — R1032 Left lower quadrant pain: Secondary | ICD-10-CM | POA: Diagnosis not present

## 2016-07-21 DIAGNOSIS — K559 Vascular disorder of intestine, unspecified: Principal | ICD-10-CM | POA: Diagnosis present

## 2016-07-21 DIAGNOSIS — I2510 Atherosclerotic heart disease of native coronary artery without angina pectoris: Secondary | ICD-10-CM | POA: Diagnosis not present

## 2016-07-21 DIAGNOSIS — I129 Hypertensive chronic kidney disease with stage 1 through stage 4 chronic kidney disease, or unspecified chronic kidney disease: Secondary | ICD-10-CM | POA: Diagnosis not present

## 2016-07-21 DIAGNOSIS — H9190 Unspecified hearing loss, unspecified ear: Secondary | ICD-10-CM | POA: Diagnosis present

## 2016-07-21 DIAGNOSIS — Z7982 Long term (current) use of aspirin: Secondary | ICD-10-CM

## 2016-07-21 DIAGNOSIS — R061 Stridor: Secondary | ICD-10-CM | POA: Diagnosis not present

## 2016-07-21 DIAGNOSIS — K449 Diaphragmatic hernia without obstruction or gangrene: Secondary | ICD-10-CM | POA: Diagnosis not present

## 2016-07-21 DIAGNOSIS — D509 Iron deficiency anemia, unspecified: Secondary | ICD-10-CM | POA: Diagnosis not present

## 2016-07-21 DIAGNOSIS — M810 Age-related osteoporosis without current pathological fracture: Secondary | ICD-10-CM | POA: Diagnosis present

## 2016-07-21 DIAGNOSIS — N183 Chronic kidney disease, stage 3 (moderate): Secondary | ICD-10-CM | POA: Diagnosis not present

## 2016-07-21 DIAGNOSIS — E7800 Pure hypercholesterolemia, unspecified: Secondary | ICD-10-CM | POA: Diagnosis present

## 2016-07-21 DIAGNOSIS — R52 Pain, unspecified: Secondary | ICD-10-CM

## 2016-07-21 LAB — COMPREHENSIVE METABOLIC PANEL
ALBUMIN: 3.5 g/dL (ref 3.5–5.0)
ALT: 13 U/L — ABNORMAL LOW (ref 14–54)
ANION GAP: 11 (ref 5–15)
AST: 30 U/L (ref 15–41)
Alkaline Phosphatase: 92 U/L (ref 38–126)
BILIRUBIN TOTAL: 0.8 mg/dL (ref 0.3–1.2)
BUN: 39 mg/dL — ABNORMAL HIGH (ref 6–20)
CO2: 20 mmol/L — ABNORMAL LOW (ref 22–32)
Calcium: 9.7 mg/dL (ref 8.9–10.3)
Chloride: 107 mmol/L (ref 101–111)
Creatinine, Ser: 1.51 mg/dL — ABNORMAL HIGH (ref 0.44–1.00)
GFR calc non Af Amer: 28 mL/min — ABNORMAL LOW (ref >60)
GFR, EST AFRICAN AMERICAN: 33 mL/min — AB (ref >60)
Glucose, Bld: 147 mg/dL — ABNORMAL HIGH (ref 65–99)
Potassium: 4.5 mmol/L (ref 3.5–5.1)
Sodium: 138 mmol/L (ref 135–145)
TOTAL PROTEIN: 7.2 g/dL (ref 6.5–8.1)

## 2016-07-21 LAB — I-STAT CHEM 8, ED
BUN: 42 mg/dL — ABNORMAL HIGH (ref 6–20)
CHLORIDE: 108 mmol/L (ref 101–111)
Calcium, Ion: 1.16 mmol/L (ref 1.15–1.40)
Creatinine, Ser: 1.7 mg/dL — ABNORMAL HIGH (ref 0.44–1.00)
Glucose, Bld: 146 mg/dL — ABNORMAL HIGH (ref 65–99)
HEMATOCRIT: 31 % — AB (ref 36.0–46.0)
HEMOGLOBIN: 10.5 g/dL — AB (ref 12.0–15.0)
POTASSIUM: 4.6 mmol/L (ref 3.5–5.1)
Sodium: 139 mmol/L (ref 135–145)
TCO2: 21 mmol/L (ref 0–100)

## 2016-07-21 LAB — GLUCOSE, CAPILLARY: GLUCOSE-CAPILLARY: 147 mg/dL — AB (ref 65–99)

## 2016-07-21 LAB — CBC WITH DIFFERENTIAL/PLATELET
Basophils Absolute: 0 10*3/uL (ref 0.0–0.1)
Basophils Relative: 0 %
EOS ABS: 0.1 10*3/uL (ref 0.0–0.7)
Eosinophils Relative: 1 %
HEMATOCRIT: 33.6 % — AB (ref 36.0–46.0)
Hemoglobin: 10.2 g/dL — ABNORMAL LOW (ref 12.0–15.0)
Lymphocytes Relative: 28 %
Lymphs Abs: 2.1 10*3/uL (ref 0.7–4.0)
MCH: 23 pg — AB (ref 26.0–34.0)
MCHC: 30.4 g/dL (ref 30.0–36.0)
MCV: 75.8 fL — ABNORMAL LOW (ref 78.0–100.0)
MONO ABS: 0.2 10*3/uL (ref 0.1–1.0)
MONOS PCT: 3 %
NEUTROS ABS: 4.9 10*3/uL (ref 1.7–7.7)
Neutrophils Relative %: 68 %
Platelets: 205 10*3/uL (ref 150–400)
RBC: 4.43 MIL/uL (ref 3.87–5.11)
RDW: 19.7 % — ABNORMAL HIGH (ref 11.5–15.5)
WBC: 7.3 10*3/uL (ref 4.0–10.5)

## 2016-07-21 MED ORDER — MORPHINE SULFATE (PF) 2 MG/ML IV SOLN
0.5000 mg | Freq: Once | INTRAVENOUS | Status: AC
  Administered 2016-07-21: 0.5 mg via INTRAVENOUS
  Filled 2016-07-21: qty 1

## 2016-07-21 MED ORDER — ONDANSETRON HCL 4 MG/2ML IJ SOLN
4.0000 mg | Freq: Once | INTRAMUSCULAR | Status: AC
  Administered 2016-07-21: 4 mg via INTRAVENOUS
  Filled 2016-07-21: qty 2

## 2016-07-21 MED ORDER — ONDANSETRON HCL 4 MG/2ML IJ SOLN
4.0000 mg | Freq: Four times a day (QID) | INTRAMUSCULAR | Status: DC | PRN
  Administered 2016-07-21: 4 mg via INTRAVENOUS
  Filled 2016-07-21: qty 2

## 2016-07-21 MED ORDER — HYDRALAZINE HCL 20 MG/ML IJ SOLN: 10.0000 mg | INTRAMUSCULAR | Status: DC | PRN

## 2016-07-21 MED ORDER — MORPHINE SULFATE (PF) 2 MG/ML IV SOLN
INTRAVENOUS | Status: AC
  Filled 2016-07-21: qty 1

## 2016-07-21 MED ORDER — LEVOTHYROXINE SODIUM 100 MCG IV SOLR
66.0000 ug | Freq: Every day | INTRAVENOUS | Status: DC
  Administered 2016-07-22 – 2016-07-25 (×4): 66 ug via INTRAVENOUS
  Filled 2016-07-21 (×6): qty 5

## 2016-07-21 MED ORDER — PIPERACILLIN-TAZOBACTAM 3.375 G IVPB 30 MIN
3.3750 g | Freq: Once | INTRAVENOUS | Status: AC
  Administered 2016-07-21: 3.375 g via INTRAVENOUS
  Filled 2016-07-21: qty 50

## 2016-07-21 MED ORDER — SODIUM CHLORIDE 0.9 % IV BOLUS (SEPSIS)
500.0000 mL | Freq: Once | INTRAVENOUS | Status: AC
  Administered 2016-07-21: 500 mL via INTRAVENOUS

## 2016-07-21 MED ORDER — CIPROFLOXACIN IN D5W 400 MG/200ML IV SOLN: 400.0000 mg | Freq: Once | INTRAVENOUS | Status: DC

## 2016-07-21 MED ORDER — ACETAMINOPHEN 325 MG PO TABS: 650.0000 mg | ORAL_TABLET | Freq: Four times a day (QID) | ORAL | Status: DC | PRN

## 2016-07-21 MED ORDER — IOPAMIDOL (ISOVUE-300) INJECTION 61%
INTRAVENOUS | Status: AC
  Filled 2016-07-21: qty 30

## 2016-07-21 MED ORDER — ONDANSETRON HCL 4 MG PO TABS: 4.0000 mg | ORAL_TABLET | Freq: Four times a day (QID) | ORAL | Status: DC | PRN

## 2016-07-21 MED ORDER — ACETAMINOPHEN 650 MG RE SUPP: 650.0000 mg | Freq: Four times a day (QID) | RECTAL | Status: DC | PRN

## 2016-07-21 MED ORDER — DEXTROSE-NACL 5-0.9 % IV SOLN
INTRAVENOUS | Status: AC
  Administered 2016-07-21 – 2016-07-22 (×2): via INTRAVENOUS

## 2016-07-21 MED ORDER — MORPHINE SULFATE (PF) 2 MG/ML IV SOLN: 2.0000 mg | Freq: Once | INTRAVENOUS | Status: DC

## 2016-07-21 NOTE — ED Notes (Signed)
Tech in to cath pt when she stated she had to have BM. Pt states abdominal pain much much better so morphine not given at this time.

## 2016-07-21 NOTE — ED Notes (Signed)
Patient had two large bowel movements on bedpan. Large formed pieces with runny stool also. Patient states that she feels a lot better afterwards. Three attempts made to obtain in and out cath. Patient very red and sore in vaginal area.

## 2016-07-21 NOTE — ED Notes (Signed)
Patient had a very large incontinent stool. Patient had to be cleaned and bed changed.  Patient is resting at this time.

## 2016-07-21 NOTE — H&P (Addendum)
History and Physical    Elizabeth Feystelle L Dilling ZOX:[id number] DOB: 1919/01/18 DOA: 07/21/2016  PCP: Fredirick MaudlinHAWKINS,EDWARD L, MD  Patient coming from: Home.  Chief Complaint: Abdominal pain.  HPI: Elizabeth [name] is a 82 y.o. female with history of CAD status post CABG, hypertension, hypothyroidism, chronic kidney disease presents to the ER because of abdominal pain. Most of the history is obtained from ER physician as patient is a poor historian and hard of hearing. Patient came to the ER because of left lower quadrant abdominal pain over the last 2 days. While in the ER patient had a large bowel movement which was formed followed by another bowel movement which was loose. CT of the abdomen and pelvis shows patient is concerning for colitis involving the descending and sigmoid area. Patient states she felt lke she was developing a flu. Patient is being admitted for acute colitis.   ED Course: CT of the abdomen and pelvis shows features concerning for descending and sigmoid colitis. Zosyn was started along with fluids.  Review of Systems: As per HPI, rest all negative.   Past Medical History:  Diagnosis Date  . B12 deficiency   . C. difficile colitis Nov 2014   Treated with Flagyl  . Coronary artery disease   . GERD (gastroesophageal reflux disease)   . Hypercholesterolemia   . Hyperlipidemia   . Hypertension   . Hypothyroidism   . Osteoporosis   . Thyroid goiter    s/p surgery now on Synthroid  . Zenker diverticulum     Past Surgical History:  Procedure Laterality Date  . ABDOMINAL HYSTERECTOMY    . APPENDECTOMY    . BUNIONECTOMY    . CORONARY ARTERY BYPASS GRAFT     Status Post four-vessel   . ESOPHAGEAL DILATION    . ESOPHAGOGASTRODUODENOSCOPY  6/10    Schatzki's ring, otherwise endoscopically normal esophagus, status post dilation (101F), with mucosal disruption through the cervical segment as well as the Schatzki's ring. Hiatal hernia. segment as well as the Schatzki's ring. Hiatal  hernia.  Marland Kitchen. ESOPHAGOGASTRODUODENOSCOPY  09/02/2007   Cervical esophageal web, Schatzki's ring, status post  dilation and disruption as described above. Otherwise, normal esophagus  . ESOPHAGOGASTRODUODENOSCOPY  August 2011   Zenker's diverticulum, Schatzki's ring s/p dilation, mod to large HH, instructions to pursue BPE if recurrent dysphagia to assess Zenker's  . ESOPHAGOGASTRODUODENOSCOPY  03/12/12   Rourk-dilated 30F, cervical web, Schatzki's ring, small HH, small Zenker's diverticulum  . ESOPHAGOGASTRODUODENOSCOPY N/A 11/26/2013   Dr. Jena Gaussourk: Zenker's diverticulum, cervical esophageal web and Schatzi's ring s/p dilation, large hiatal hernia, consider ENT referral if persistent dysphagia.   Marland Kitchen. MALONEY DILATION N/A 11/26/2013   Procedure: Elease HashimotoMALONEY DILATION;  Surgeon: Corbin Adeobert M Rourk, MD;  Location: [facility];  Service: Endoscopy;  Laterality: N/A;     reports that she has never smoked. She has never used smokeless tobacco. She reports that she does not drink alcohol or use drugs.  No Known Allergies  Family History  Problem Relation Age of Onset  . Colon cancer Neg Hx     Prior to Admission medications   Medication Sig Start Date End Date Taking? Authorizing Provider  amLODipine (NORVASC) 5 MG tablet Take 5 mg by mouth daily after breakfast.  12/10/11  Yes Historical Provider, MD  aspirin 325 MG tablet Take 325 mg by mouth every morning.    Yes Historical Provider, MD  atorvastatin (LIPITOR) 10 MG tablet Take 10 mg by mouth every morning.  12/10/11  Yes Historical  Provider, MD  lansoprazole (PREVACID) 30 MG capsule Take 1 capsule (30 mg total) by mouth daily. 11/26/13  Yes Corbin Adeobert M Rourk, MD  levothyroxine (SYNTHROID, LEVOTHROID) 112 MCG tablet Take 112 mcg by mouth daily before breakfast.   Yes Historical Provider, MD  quinapril (ACCUPRIL) 20 MG tablet Take 20 mg by mouth daily after breakfast.  12/10/11  Yes Historical Provider, MD    Physical Exam: Vitals:   07/21/16 2100 07/21/16 2130  07/21/16 2200 07/21/16 2247  BP: 147/91 121/81 116/62   Pulse:      Resp:    20  Temp:      TempSrc:      SpO2:    100%  Weight:          Constitutional: Moderately build and nourishment. Vitals:   07/21/16 2100 07/21/16 2130 07/21/16 2200 07/21/16 2247  BP: 147/91 121/81 116/62   Pulse:      Resp:    20  Temp:      TempSrc:      SpO2:    100%  Weight:       Eyes: Anicteric no pallor. ENMT: No discharge from the ears eyes nose or mouth. Neck: No mass felt. No neck rigidity. Respiratory: No rhonchi or crepitations. Cardiovascular: S1 and S2 heard. Abdomen: Mild tenderness in the lower quadrants. Musculoskeletal: No edema. No joint effusion. Skin: No rash. Skin appears warm. Neurologic: Alert awake oriented to name and place moves all extremities. Psychiatric: Appears normal. Normal affect.   Labs on Admission: I have personally reviewed following labs and imaging studies  CBC:  Recent Labs Lab 07/21/16 1912 07/21/16 1921  WBC 7.3  --   NEUTROABS 4.9  --   HGB 10.2* 10.5*  HCT 33.6* 31.0*  MCV 75.8*  --   PLT 205  --    Basic Metabolic Panel:  Recent Labs Lab 07/21/16 1912 07/21/16 1921  NA 138 139  K 4.5 4.6  CL 107 108  CO2 20*  --   GLUCOSE 147* 146*  BUN 39* 42*  CREATININE 1.51* 1.70*  CALCIUM 9.7  --    GFR: CrCl cannot be calculated (Unknown ideal weight.). Liver Function Tests:  Recent Labs Lab 07/21/16 1912  AST 30  ALT 13*  ALKPHOS 92  BILITOT 0.8  PROT 7.2  ALBUMIN 3.5   No results for input(s): LIPASE, AMYLASE in the last 168 hours. No results for input(s): AMMONIA in the last 168 hours. Coagulation Profile: No results for input(s): INR, PROTIME in the last 168 hours. Cardiac Enzymes: No results for input(s): CKTOTAL, CKMB, CKMBINDEX, TROPONINI in the last 168 hours. BNP (last 3 results) No results for input(s): PROBNP in the last 8760 hours. HbA1C: No results for input(s): HGBA1C in the last 72 hours. CBG: No results  for input(s): GLUCAP in the last 168 hours. Lipid Profile: No results for input(s): CHOL, HDL, LDLCALC, TRIG, CHOLHDL, LDLDIRECT in the last 72 hours. Thyroid Function Tests: No results for input(s): TSH, T4TOTAL, FREET4, T3FREE, THYROIDAB in the last 72 hours. Anemia Panel: No results for input(s): VITAMINB12, FOLATE, FERRITIN, TIBC, IRON, RETICCTPCT in the last 72 hours. Urine analysis:    Component Value Date/Time   COLORURINE YELLOW 11/02/2014 1025   APPEARANCEUR CLEAR 11/02/2014 1025   LABSPEC 1.015 11/02/2014 1025   PHURINE 6.5 11/02/2014 1025   GLUCOSEU NEGATIVE 11/02/2014 1025   HGBUR LARGE (A) 11/02/2014 1025   BILIRUBINUR NEGATIVE 11/02/2014 1025   KETONESUR NEGATIVE 11/02/2014 1025   PROTEINUR TRACE (A) 11/02/2014  1025   UROBILINOGEN 0.2 11/02/2014 1025   NITRITE NEGATIVE 11/02/2014 1025   LEUKOCYTESUR NEGATIVE 11/02/2014 1025   Sepsis Labs: @LABRCNTIP (procalcitonin:4,lacticidven:4) )No results found for this or any previous visit (from the past 240 hour(s)).   Radiological Exams on Admission: Ct Abdomen Pelvis Wo Contrast  Result Date: 07/21/2016 CLINICAL DATA:  Generalized abdominal pain. EXAM: CT ABDOMEN AND PELVIS WITHOUT CONTRAST TECHNIQUE: Multidetector CT imaging of the abdomen and pelvis was performed following the standard protocol without IV contrast. COMPARISON:  CT scan of October 18, 2014. FINDINGS: Lower chest: Large hiatal hernia is noted. Visualized lung bases are unremarkable. Hepatobiliary: No focal liver abnormality is seen. No gallstones, gallbladder wall thickening, or biliary dilatation. Pancreas: Unremarkable. No pancreatic ductal dilatation or surrounding inflammatory changes. Spleen: Normal in size without focal abnormality. Adrenals/Urinary Tract: Adrenal glands are unremarkable. Mild left renal atrophy is noted. No hydronephrosis or renal obstruction is noted. No renal or ureteral calculi are noted. Urinary bladder appears normal. Stomach/Bowel:  There is no evidence of bowel obstruction. Inflammatory changes are seen involving the descending colon an sigmoid colon concerning for colitis. Vascular/Lymphatic: Aortic atherosclerosis. No enlarged abdominal or pelvic lymph nodes. Reproductive: Status post hysterectomy. No adnexal masses. Other: No abdominal wall hernia or abnormality. No abdominopelvic ascites. Musculoskeletal: Severe multilevel degenerative disc disease is noted in the lumbar spine. IMPRESSION: Large hiatal hernia. Mild left renal atrophy. Aortic atherosclerosis. Inflammatory changes are seen surrounding the descending and sigmoid colon concerning for infectious or inflammatory colitis. Electronically Signed   By: Lupita Raider, M.D.   On: 07/21/2016 21:15     Assessment/Plan Principal Problem:   Colitis Active Problems:   HTN (hypertension), benign   Hypothyroidism    1. Descending and sigmoid colitis - primarily concerning for ischemic colitis. Will check lactate levels. For now will keep patient nothing by mouth and on Zosyn and IV fluids. Stool studies if there is any further episodes of diarrhea. 2. Hypertension - since patient is nothing by mouth I have placed patient on when necessary IV hydralazine. 3. Hypothyroidism - on IV Synthroid until patient can take orally. 4. History of CAD status post CABG - has not complained of any chest pain. 5. Chronic kidney disease stage III - follow metabolic panel closely. 6. Chronic anemia - follow CBC.   DVT prophylaxis: Lovenox. Code Status: Full code.  Family Communication: No family at bedside.  Disposition Plan: Home.  Consults called: None.  Admission status: Observation.    Eduard Clos MD [facility] Pager [phone].  If 7PM-7AM, please contact night-coverage [email or web address] Password [email or web address]  07/21/2016, 11:11 PM

## 2016-07-21 NOTE — ED Provider Notes (Signed)
[facility]PT Provider Note   CSN: [id number] Arrival date & time: 07/21/16  1857     History   Chief Complaint Chief Complaint  Patient presents with  . Abdominal Pain    HPI Elizabeth [name] is a 82 y.o. female.  Patient complains of abdominal pain for the last couple days   The history is provided by the patient. No language interpreter was used.  Abdominal Pain   This is a new problem. The current episode started 12 to 24 hours ago. The problem occurs constantly. The problem has not changed since onset.The pain is associated with an unknown factor. The pain is located in the LLQ. The quality of the pain is aching. The pain is at a severity of 5/10. The pain is moderate. Associated symptoms include anorexia and nausea. Pertinent negatives include diarrhea, frequency, hematuria and headaches. Nothing aggravates the symptoms.    Past Medical History:  Diagnosis Date  . B12 deficiency   . C. difficile colitis Nov 2014   Treated with Flagyl  . Coronary artery disease   . GERD (gastroesophageal reflux disease)   . Hypercholesterolemia   . Hyperlipidemia   . Hypertension   . Hypothyroidism   . Osteoporosis   . Thyroid goiter    s/p surgery now on Synthroid  . Zenker diverticulum     Patient Active Problem List   Diagnosis Date Noted  . Rectal bleeding 10/16/2014  . Anemia 10/16/2014  . Oropharyngeal dysphagia 11/02/2013  . Clostridium difficile colitis 06/01/2013  . Acute colitis 05/29/2013  . Hematochezia 05/29/2013  . HTN (hypertension), benign 05/29/2013  . Hypothyroidism 05/29/2013  . Hypotension, unspecified 05/29/2013  . ARF (acute renal failure) (HCC) 05/29/2013  . Anemia due to acute blood loss 05/29/2013  . Ischemic colitis (HCC) 09/02/2012  . Colitis 07/21/2012  . Viral syndrome 07/19/2012  . CAP (community acquired pneumonia), possibly 07/19/2012  . Dehydration 07/19/2012  . Diarrhea 07/19/2012  . Hyponatremia 07/19/2012  . GERD 02/27/2010    . Dysphagia 02/27/2010  . ESOPHAGEAL STRICTURE 11/30/2008    Past Surgical History:  Procedure Laterality Date  . ABDOMINAL HYSTERECTOMY    . APPENDECTOMY    . BUNIONECTOMY    . CORONARY ARTERY BYPASS GRAFT     Status Post four-vessel   . ESOPHAGEAL DILATION    . ESOPHAGOGASTRODUODENOSCOPY  6/10    Schatzki's ring, otherwise endoscopically normal esophagus, status post dilation (63F), with mucosal disruption through the cervical segment as well as the Schatzki's ring. Hiatal hernia. segment as well as the Schatzki's ring. Hiatal hernia.  Marland Kitchen. ESOPHAGOGASTRODUODENOSCOPY  09/02/2007   Cervical esophageal web, Schatzki's ring, status post  dilation and disruption as described above. Otherwise, normal esophagus  . ESOPHAGOGASTRODUODENOSCOPY  August 2011   Zenker's diverticulum, Schatzki's ring s/p dilation, mod to large HH, instructions to pursue BPE if recurrent dysphagia to assess Zenker's  . ESOPHAGOGASTRODUODENOSCOPY  03/12/12   Rourk-dilated 73F, cervical web, Schatzki's ring, small HH, small Zenker's diverticulum  . ESOPHAGOGASTRODUODENOSCOPY N/A 11/26/2013   Dr. Jena Gaussourk: Zenker's diverticulum, cervical esophageal web and Schatzi's ring s/p dilation, large hiatal hernia, consider ENT referral if persistent dysphagia.   Marland Kitchen. MALONEY DILATION N/A 11/26/2013   Procedure: Elease HashimotoMALONEY DILATION;  Surgeon: Corbin Adeobert M Rourk, MD;  Location: [facility];  Service: Endoscopy;  Laterality: N/A;    OB History    Gravida Para Term Preterm AB Living             0   SAB TAB Ectopic Multiple Live  Births                   Home Medications    Prior to Admission medications   Medication Sig Start Date End Date Taking? Authorizing Provider  amLODipine (NORVASC) 5 MG tablet Take 5 mg by mouth daily after breakfast.  12/10/11  Yes Historical Provider, MD  aspirin 325 MG tablet Take 325 mg by mouth every morning.    Yes Historical Provider, MD  atorvastatin (LIPITOR) 10 MG tablet Take 10 mg by mouth every  morning.  12/10/11  Yes Historical Provider, MD  lansoprazole (PREVACID) 30 MG capsule Take 1 capsule (30 mg total) by mouth daily. 11/26/13  Yes Corbin Ade, MD  levothyroxine (SYNTHROID, LEVOTHROID) 112 MCG tablet Take 112 mcg by mouth daily before breakfast.   Yes Historical Provider, MD  quinapril (ACCUPRIL) 20 MG tablet Take 20 mg by mouth daily after breakfast.  12/10/11  Yes Historical Provider, MD    Family History Family History  Problem Relation Age of Onset  . Colon cancer Neg Hx     Social History Social History  Substance Use Topics  . Smoking status: Never Smoker  . Smokeless tobacco: Never Used  . Alcohol use No     Allergies   Patient has no known allergies.   Review of Systems Review of Systems  Constitutional: Negative for appetite change and fatigue.  HENT: Negative for congestion, ear discharge and sinus pressure.   Eyes: Negative for discharge.  Respiratory: Negative for cough.   Cardiovascular: Negative for chest pain.  Gastrointestinal: Positive for abdominal pain, anorexia and nausea. Negative for diarrhea.  Genitourinary: Negative for frequency and hematuria.  Musculoskeletal: Negative for back pain.  Skin: Negative for rash.  Neurological: Negative for seizures and headaches.  Psychiatric/Behavioral: Negative for hallucinations.     Physical Exam Updated Vital Signs BP 147/91   Pulse 113   Temp 97.5 F (36.4 C) (Oral)   Resp 24   Wt 120 lb (54.4 kg)   SpO2 97%   BMI 20.60 kg/m   Physical Exam  Constitutional: She is oriented to person, place, and time. She appears well-developed.  HENT:  Head: Normocephalic.  Eyes: Conjunctivae and EOM are normal. No scleral icterus.  Neck: Neck supple. No thyromegaly present.  Cardiovascular: Normal rate and regular rhythm.  Exam reveals no gallop and no friction rub.   No murmur heard. Pulmonary/Chest: No stridor. She has no wheezes. She has no rales. She exhibits no tenderness.  Abdominal: She  exhibits no distension. There is tenderness. There is no rebound.  Moderate left lower quadrant abdominal tenderness  Musculoskeletal: Normal range of motion. She exhibits no edema.  Lymphadenopathy:    She has no cervical adenopathy.  Neurological: She is oriented to person, place, and time. She exhibits normal muscle tone. Coordination normal.  Skin: No rash noted. No erythema.  Psychiatric: She has a normal mood and affect. Her behavior is normal.     ED Treatments / Results  Labs (all labs ordered are listed, but only abnormal results are displayed) Labs Reviewed  CBC WITH DIFFERENTIAL/PLATELET - Abnormal; Notable for the following:       Result Value   Hemoglobin 10.2 (*)    HCT 33.6 (*)    MCV 75.8 (*)    MCH 23.0 (*)    RDW 19.7 (*)    All other components within normal limits  COMPREHENSIVE METABOLIC PANEL - Abnormal; Notable for the following:    CO2 20 (*)  Glucose, Bld 147 (*)    BUN 39 (*)    Creatinine, Ser 1.51 (*)    ALT 13 (*)    GFR calc non Af Amer 28 (*)    GFR calc Af Amer 33 (*)    All other components within normal limits  I-STAT CHEM 8, ED - Abnormal; Notable for the following:    BUN 42 (*)    Creatinine, Ser 1.70 (*)    Glucose, Bld 146 (*)    Hemoglobin 10.5 (*)    HCT 31.0 (*)    All other components within normal limits    EKG  EKG Interpretation None       Radiology Ct Abdomen Pelvis Wo Contrast  Result Date: 07/21/2016 CLINICAL DATA:  Generalized abdominal pain. EXAM: CT ABDOMEN AND PELVIS WITHOUT CONTRAST TECHNIQUE: Multidetector CT imaging of the abdomen and pelvis was performed following the standard protocol without IV contrast. COMPARISON:  CT scan of October 18, 2014. FINDINGS: Lower chest: Large hiatal hernia is noted. Visualized lung bases are unremarkable. Hepatobiliary: No focal liver abnormality is seen. No gallstones, gallbladder wall thickening, or biliary dilatation. Pancreas: Unremarkable. No pancreatic ductal dilatation  or surrounding inflammatory changes. Spleen: Normal in size without focal abnormality. Adrenals/Urinary Tract: Adrenal glands are unremarkable. Mild left renal atrophy is noted. No hydronephrosis or renal obstruction is noted. No renal or ureteral calculi are noted. Urinary bladder appears normal. Stomach/Bowel: There is no evidence of bowel obstruction. Inflammatory changes are seen involving the descending colon an sigmoid colon concerning for colitis. Vascular/Lymphatic: Aortic atherosclerosis. No enlarged abdominal or pelvic lymph nodes. Reproductive: Status post hysterectomy. No adnexal masses. Other: No abdominal wall hernia or abnormality. No abdominopelvic ascites. Musculoskeletal: Severe multilevel degenerative disc disease is noted in the lumbar spine. IMPRESSION: Large hiatal hernia. Mild left renal atrophy. Aortic atherosclerosis. Inflammatory changes are seen surrounding the descending and sigmoid colon concerning for infectious or inflammatory colitis. Electronically Signed   By: Lupita RaiderJames  Green Jr, M.D.   On: 07/21/2016 21:15    Procedures Procedures (including critical care time)  Medications Ordered in ED Medications  iopamidol (ISOVUE-300) 61 % injection (not administered)  ciprofloxacin (CIPRO) IVPB 400 mg (not administered)  ondansetron (ZOFRAN) injection 4 mg (not administered)  sodium chloride 0.9 % bolus 500 mL (0 mLs Intravenous Stopped 07/21/16 2031)     Initial Impression / Assessment and Plan / ED Course  I have reviewed the triage vital signs and the nursing notes.  Pertinent labs & imaging results that were available during my care of the patient were reviewed by me and considered in my medical decision making (see chart for details).  Clinical Course     Patient had 2 large bowel movements in emergency department. Her pain improves some. Although she started to have nausea vomiting felt like she had chills. CT scan suggests colitis. Patient will be admitted with  colitis  Final Clinical Impressions(s) / ED Diagnoses   Final diagnoses:  Pain    New Prescriptions New Prescriptions   No medications on file     Bethann BerkshireJoseph Aleaha Fickling, MD 07/21/16 2140

## 2016-07-21 NOTE — ED Triage Notes (Signed)
Pt arrived to er by [facility] with c/o abd pain, unable to have bowel movement, pt moans, yells out in pain during triage, difficult to obtain history on pt, unsure of last bowel movement,

## 2016-07-21 NOTE — ED Notes (Signed)
Notified pt's neighbor Lonia ChimeraBradley Goodman [phone] that pt was being admitted so that he could notify pt's roommate who does not have a phone.

## 2016-07-22 DIAGNOSIS — M810 Age-related osteoporosis without current pathological fracture: Secondary | ICD-10-CM | POA: Diagnosis present

## 2016-07-22 DIAGNOSIS — H9190 Unspecified hearing loss, unspecified ear: Secondary | ICD-10-CM | POA: Diagnosis present

## 2016-07-22 DIAGNOSIS — N183 Chronic kidney disease, stage 3 (moderate): Secondary | ICD-10-CM | POA: Diagnosis present

## 2016-07-22 DIAGNOSIS — D509 Iron deficiency anemia, unspecified: Secondary | ICD-10-CM | POA: Diagnosis present

## 2016-07-22 DIAGNOSIS — Z951 Presence of aortocoronary bypass graft: Secondary | ICD-10-CM | POA: Diagnosis not present

## 2016-07-22 DIAGNOSIS — I2510 Atherosclerotic heart disease of native coronary artery without angina pectoris: Secondary | ICD-10-CM | POA: Diagnosis present

## 2016-07-22 DIAGNOSIS — R1030 Lower abdominal pain, unspecified: Secondary | ICD-10-CM | POA: Diagnosis not present

## 2016-07-22 DIAGNOSIS — E7800 Pure hypercholesterolemia, unspecified: Secondary | ICD-10-CM | POA: Diagnosis present

## 2016-07-22 DIAGNOSIS — R52 Pain, unspecified: Secondary | ICD-10-CM | POA: Diagnosis not present

## 2016-07-22 DIAGNOSIS — E039 Hypothyroidism, unspecified: Secondary | ICD-10-CM | POA: Diagnosis present

## 2016-07-22 DIAGNOSIS — D649 Anemia, unspecified: Secondary | ICD-10-CM | POA: Diagnosis not present

## 2016-07-22 DIAGNOSIS — I129 Hypertensive chronic kidney disease with stage 1 through stage 4 chronic kidney disease, or unspecified chronic kidney disease: Secondary | ICD-10-CM | POA: Diagnosis present

## 2016-07-22 DIAGNOSIS — K219 Gastro-esophageal reflux disease without esophagitis: Secondary | ICD-10-CM | POA: Diagnosis present

## 2016-07-22 DIAGNOSIS — K529 Noninfective gastroenteritis and colitis, unspecified: Secondary | ICD-10-CM | POA: Diagnosis not present

## 2016-07-22 DIAGNOSIS — D638 Anemia in other chronic diseases classified elsewhere: Secondary | ICD-10-CM | POA: Diagnosis present

## 2016-07-22 DIAGNOSIS — Z7982 Long term (current) use of aspirin: Secondary | ICD-10-CM | POA: Diagnosis not present

## 2016-07-22 DIAGNOSIS — D500 Iron deficiency anemia secondary to blood loss (chronic): Secondary | ICD-10-CM | POA: Diagnosis not present

## 2016-07-22 DIAGNOSIS — E538 Deficiency of other specified B group vitamins: Secondary | ICD-10-CM | POA: Diagnosis present

## 2016-07-22 DIAGNOSIS — K559 Vascular disorder of intestine, unspecified: Secondary | ICD-10-CM | POA: Diagnosis present

## 2016-07-22 LAB — CBC WITH DIFFERENTIAL/PLATELET
BASOS ABS: 0 10*3/uL (ref 0.0–0.1)
Basophils Relative: 0 %
EOS ABS: 0 10*3/uL (ref 0.0–0.7)
EOS PCT: 0 %
HCT: 27.7 % — ABNORMAL LOW (ref 36.0–46.0)
Hemoglobin: 8.3 g/dL — ABNORMAL LOW (ref 12.0–15.0)
LYMPHS PCT: 7 %
Lymphs Abs: 0.9 10*3/uL (ref 0.7–4.0)
MCH: 22.8 pg — ABNORMAL LOW (ref 26.0–34.0)
MCHC: 30 g/dL (ref 30.0–36.0)
MCV: 76.1 fL — AB (ref 78.0–100.0)
MONO ABS: 0.5 10*3/uL (ref 0.1–1.0)
Monocytes Relative: 4 %
Neutro Abs: 10.5 10*3/uL — ABNORMAL HIGH (ref 1.7–7.7)
Neutrophils Relative %: 89 %
PLATELETS: 162 10*3/uL (ref 150–400)
RBC: 3.64 MIL/uL — ABNORMAL LOW (ref 3.87–5.11)
RDW: 19.6 % — AB (ref 11.5–15.5)
WBC: 11.8 10*3/uL — AB (ref 4.0–10.5)

## 2016-07-22 LAB — COMPREHENSIVE METABOLIC PANEL
ALBUMIN: 2.4 g/dL — AB (ref 3.5–5.0)
ALK PHOS: 63 U/L (ref 38–126)
ALT: 10 U/L — AB (ref 14–54)
AST: 21 U/L (ref 15–41)
Anion gap: 7 (ref 5–15)
BILIRUBIN TOTAL: 0.8 mg/dL (ref 0.3–1.2)
BUN: 41 mg/dL — AB (ref 6–20)
CALCIUM: 8 mg/dL — AB (ref 8.9–10.3)
CO2: 21 mmol/L — ABNORMAL LOW (ref 22–32)
CREATININE: 1.56 mg/dL — AB (ref 0.44–1.00)
Chloride: 110 mmol/L (ref 101–111)
GFR calc Af Amer: 31 mL/min — ABNORMAL LOW (ref >60)
GFR, EST NON AFRICAN AMERICAN: 27 mL/min — AB (ref >60)
GLUCOSE: 164 mg/dL — AB (ref 65–99)
Potassium: 4.6 mmol/L (ref 3.5–5.1)
Sodium: 138 mmol/L (ref 135–145)
TOTAL PROTEIN: 5 g/dL — AB (ref 6.5–8.1)

## 2016-07-22 LAB — LACTIC ACID, PLASMA
LACTIC ACID, VENOUS: 2.5 mmol/L — AB (ref 0.5–1.9)
LACTIC ACID, VENOUS: 2.1 mmol/L — AB (ref 0.5–1.9)

## 2016-07-22 LAB — GLUCOSE, CAPILLARY
Glucose-Capillary: 171 mg/dL — ABNORMAL HIGH (ref 65–99)
Glucose-Capillary: 134 mg/dL — ABNORMAL HIGH (ref 65–99)
Glucose-Capillary: 138 mg/dL — ABNORMAL HIGH (ref 65–99)

## 2016-07-22 MED ORDER — SODIUM CHLORIDE 0.9 % IV BOLUS (SEPSIS)
500.0000 mL | Freq: Once | INTRAVENOUS | Status: AC
  Administered 2016-07-22: 500 mL via INTRAVENOUS

## 2016-07-22 MED ORDER — METRONIDAZOLE IN NACL 5-0.79 MG/ML-% IV SOLN
500.0000 mg | Freq: Three times a day (TID) | INTRAVENOUS | Status: DC
  Administered 2016-07-22 – 2016-07-24 (×5): 500 mg via INTRAVENOUS
  Filled 2016-07-22 (×6): qty 100

## 2016-07-22 MED ORDER — PIPERACILLIN-TAZOBACTAM 3.375 G IVPB 30 MIN
3.3750 g | Freq: Once | INTRAVENOUS | Status: AC
  Administered 2016-07-22: 3.375 g via INTRAVENOUS
  Filled 2016-07-22 (×2): qty 50

## 2016-07-22 MED ORDER — PIPERACILLIN SOD-TAZOBACTAM SO 2.25 (2-0.25) G IV SOLR
2.2500 g | Freq: Three times a day (TID) | INTRAVENOUS | Status: DC
  Filled 2016-07-22 (×7): qty 2.25

## 2016-07-22 MED ORDER — PIPERACILLIN-TAZOBACTAM 3.375 G IVPB: 3.3750 g | Freq: Once | INTRAVENOUS | Status: DC

## 2016-07-22 MED ORDER — CIPROFLOXACIN IN D5W 400 MG/200ML IV SOLN
400.0000 mg | INTRAVENOUS | Status: DC
  Administered 2016-07-22 – 2016-07-23 (×2): 400 mg via INTRAVENOUS
  Filled 2016-07-22 (×2): qty 200

## 2016-07-22 NOTE — Progress Notes (Signed)
PHARMACY NOTE:  ANTIMICROBIAL RENAL DOSAGE ADJUSTMENT  Current antimicrobial regimen includes a mismatch between antimicrobial dosage and estimated renal function.  As per policy approved by the Pharmacy & Therapeutics and Medical Executive Committees, the antimicrobial dosage will be adjusted accordingly.  Current antimicrobial dosage:  Ciprofloxacin 400 mg IV q12h  Indication: colitis  Renal Function:  Estimated Creatinine Clearance: 19.4 mL/min (by C-G formula based on SCr of 1.56 mg/dL (H)). []      On intermittent HD, scheduled: []      On CRRT    Antimicrobial dosage has been changed to:  Ciprofloxacin 400 mg IV q24h   Thank you for allowing pharmacy to be a part of this patient's care.  Lenore MannerHolcombe, [place] [facility] [place], [facility] Surgery CenterRPH 07/22/2016 3:59 PM

## 2016-07-22 NOTE — Progress Notes (Addendum)
Patient had a 13 beat run of VT, MD informed and will continue to monitor

## 2016-07-22 NOTE — Consult Note (Signed)
Referring Provider: Fredirick MaudlinEdward L Hawkins, MD Primary Care Physician:  Fredirick MaudlinHAWKINS,EDWARD L, MD Primary Gastroenterologist:  Dr. Karilyn Cotaehman  Reason for Consultation:    Left-sided colitis.  HPI:   History obtained from patient's chart and nursing staff as patient unable to provide any details. No family member at bedside.  Patient is 82 year old Caucasian female who lives with her niece and was brought to emergency room by EMS yesterday evening because of abdominal pain and constipation. She does not take any laxatives. While in emergency room she had formed stool followed by loose stool and subsequent she was incontinent pass large amount of liquid stool. She was noted to have mildly elevated lactic acid level and abdominopelvic CT revealed inflammatory changes involving descending and sigmoid colon and surrounding areas. This study also revealed large hiatal hernia mild left renal atrophy and atherosclerotic changes to aorta. Patient was hospitalized. She was begun on IV Zosyn. This afternoon she is complaining blood being started. She is also wondering why she has not had cataract fixed in her right eye. She denies abdominal pain. There is no history of recent antibiotic use. According to Dr. Juanetta GoslingHawkins she has not been seen by him for several months. She has history of Schatzki's ring and had esophagus dilated by Dr. Jena Gaussourk in May 2015 and also in August 2013.   Past Medical History:  Diagnosis Date  . B12 deficiency   . C. difficile colitis Nov 2014   Treated with Flagyl  . Coronary artery disease   . GERD (gastroesophageal reflux disease)   .    Marland Kitchen. Hyperlipidemia   . Hypertension       . Osteoporosis   . Goiter    s/p surgery now on Synthroid  . Zenker diverticulum     Past Surgical History:  Procedure Laterality Date  . ABDOMINAL HYSTERECTOMY    . APPENDECTOMY    . BUNIONECTOMY    . CORONARY ARTERY BYPASS GRAFT     Status Post four-vessel   . ESOPHAGEAL DILATION    .  ESOPHAGOGASTRODUODENOSCOPY  6/10    Schatzki's ring, otherwise endoscopically normal esophagus, status post dilation (18F), with mucosal disruption through the cervical segment as well as the Schatzki's ring. Hiatal hernia. segment as well as the Schatzki's ring. Hiatal hernia.  Marland Kitchen. ESOPHAGOGASTRODUODENOSCOPY  09/02/2007   Cervical esophageal web, Schatzki's ring, status post  dilation and disruption as described above. Otherwise, normal esophagus  . ESOPHAGOGASTRODUODENOSCOPY  August 2011   Zenker's diverticulum, Schatzki's ring s/p dilation, mod to large HH, instructions to pursue BPE if recurrent dysphagia to assess Zenker's  . ESOPHAGOGASTRODUODENOSCOPY  03/12/12   Rourk-dilated 1F, cervical web, Schatzki's ring, small HH, small Zenker's diverticulum  . ESOPHAGOGASTRODUODENOSCOPY N/A 11/26/2013   Dr. Jena Gaussourk: Zenker's diverticulum, cervical esophageal web and Schatzi's ring s/p dilation, large hiatal hernia, consider ENT referral if persistent dysphagia.   Marland Kitchen. MALONEY DILATION N/A 11/26/2013   Procedure: Elease HashimotoMALONEY DILATION;  Surgeon: Corbin Adeobert M Rourk, MD;  Location: [facility];  Service: Endoscopy;  Laterality: N/A;    Prior to Admission medications   Medication Sig Start Date End Date Taking? Authorizing Provider  amLODipine (NORVASC) 5 MG tablet Take 5 mg by mouth daily after breakfast.  12/10/11  Yes Historical Provider, MD  aspirin 325 MG tablet Take 325 mg by mouth every morning.    Yes Historical Provider, MD  atorvastatin (LIPITOR) 10 MG tablet Take 10 mg by mouth every morning.  12/10/11  Yes Historical Provider, MD  lansoprazole (PREVACID) 30 MG capsule  Take 1 capsule (30 mg total) by mouth daily. 11/26/13  Yes Corbin Ade, MD  levothyroxine (SYNTHROID, LEVOTHROID) 112 MCG tablet Take 112 mcg by mouth daily before breakfast.   Yes Historical Provider, MD  quinapril (ACCUPRIL) 20 MG tablet Take 20 mg by mouth daily after breakfast.  12/10/11  Yes Historical Provider, MD    Current  Facility-Administered Medications  Medication Dose Route Frequency Provider Last Rate Last Dose  . acetaminophen (TYLENOL) tablet 650 mg  650 mg Oral Q6H PRN Eduard Clos, MD       Or  . acetaminophen (TYLENOL) suppository 650 mg  650 mg Rectal Q6H PRN Eduard Clos, MD      . ciprofloxacin (CIPRO) IVPB 400 mg  400 mg Intravenous Q12H Najeeb U Rehman, MD      . dextrose 5 %-0.9 % sodium chloride infusion   Intravenous Continuous Eduard Clos, MD 75 mL/hr at 07/22/16 0355    . hydrALAZINE (APRESOLINE) injection 10 mg  10 mg Intravenous Q4H PRN Eduard Clos, MD      . levothyroxine (SYNTHROID, LEVOTHROID) injection 66 mcg  66 mcg Intravenous QAC breakfast Eduard Clos, MD   66 mcg at 07/22/16 1030  . metroNIDAZOLE (FLAGYL) IVPB 500 mg  500 mg Intravenous Q8H Najeeb U Rehman, MD      . ondansetron (ZOFRAN) tablet 4 mg  4 mg Oral Q6H PRN Eduard Clos, MD       Or  . ondansetron [facility]) injection 4 mg  4 mg Intravenous Q6H PRN Eduard Clos, MD   4 mg at 07/21/16 2349    Allergies as of 07/21/2016  . (No Known Allergies)    Family History  Problem Relation Age of Onset  . Colon cancer Neg Hx     Social History   Social History  . Marital status: Widowed    Spouse name: N/A  . Number of children: N/A  . Years of education: N/A   Occupational History  . retired    Social History Main Topics  . Smoking status: Never Smoker  . Smokeless tobacco: Never Used  . Alcohol use No  . Drug use: No  . Sexual activity: Not Currently   Other Topics Concern  . Not on file   Social History Narrative  . No narrative on file    Review of Systems: See HPI, otherwise normal ROS  Physical Exam: Temp:  [97.5 F (36.4 C)-99.2 F (37.3 C)] 99.2 F (37.3 C) (12/31 1427) Pulse Rate:  [71-113] 78 (12/31 1427) Resp:  [16-24] 16 (12/31 1427) BP: (94-147)/(47-91) 113/47 (12/31 1427) SpO2:  [94 %-100 %] 94 % (12/31 1427) Weight:  [120 lb (54.4  kg)-125 lb 14.1 oz (57.1 kg)] 125 lb 14.1 oz (57.1 kg) (12/30 2322) Last BM Date: 07/21/16  Patient is alert and in no acute distress. She is confused but responds appropriately to questions. Conjunctiva is pale. Sclerae nonicteric. Oropharyngeal mucosa is dry. She has upper dental plate and her own CT the lower jaw. No neck masses or thyromegaly noted. She has midsternal scar. Cardiac exam with regular rhythm normal S1 and S2. No murmur or gallop noted. Lungs are clear to auscultation. Abdomen is symmetrical. Bowel sounds are normal. No bruits noted. On palpation abdomen is soft with generalized mild to moderate tenderness with some guarding but no rebound. She has changes of degenerative arthritis in fingers of both hands. No peripheral edema noted.   Lab Results:  Recent Labs  07/21/16 1912 07/21/16 1921 07/22/16 0614  WBC 7.3  --  11.8*  HGB 10.2* 10.5* 8.3*  HCT 33.6* 31.0* 27.7*  PLT 205  --  162   BMET  Recent Labs  07/21/16 1912 07/21/16 1921 07/22/16 0614  NA 138 139 138  K 4.5 4.6 4.6  CL 107 108 110  CO2 20*  --  21*  GLUCOSE 147* 146* 164*  BUN 39* 42* 41*  CREATININE 1.51* 1.70* 1.56*  CALCIUM 9.7  --  8.0*   LFT  Recent Labs  07/22/16 0614  PROT 5.0*  ALBUMIN 2.4*  AST 21  ALT 10*  ALKPHOS 63  BILITOT 0.8    Studies/Results: Ct Abdomen Pelvis Wo Contrast  Result Date: 07/21/2016 CLINICAL DATA:  Generalized abdominal pain. EXAM: CT ABDOMEN AND PELVIS WITHOUT CONTRAST TECHNIQUE: Multidetector CT imaging of the abdomen and pelvis was performed following the standard protocol without IV contrast. COMPARISON:  CT scan of October 18, 2014. FINDINGS: Lower chest: Large hiatal hernia is noted. Visualized lung bases are unremarkable. Hepatobiliary: No focal liver abnormality is seen. No gallstones, gallbladder wall thickening, or biliary dilatation. Pancreas: Unremarkable. No pancreatic ductal dilatation or surrounding inflammatory changes. Spleen:  Normal in size without focal abnormality. Adrenals/Urinary Tract: Adrenal glands are unremarkable. Mild left renal atrophy is noted. No hydronephrosis or renal obstruction is noted. No renal or ureteral calculi are noted. Urinary bladder appears normal. Stomach/Bowel: There is no evidence of bowel obstruction. Inflammatory changes are seen involving the descending colon an sigmoid colon concerning for colitis. Vascular/Lymphatic: Aortic atherosclerosis. No enlarged abdominal or pelvic lymph nodes. Reproductive: Status post hysterectomy. No adnexal masses. Other: No abdominal wall hernia or abnormality. No abdominopelvic ascites. Musculoskeletal: Severe multilevel degenerative disc disease is noted in the lumbar spine. IMPRESSION: Large hiatal hernia. Mild left renal atrophy. Aortic atherosclerosis. Inflammatory changes are seen surrounding the descending and sigmoid colon concerning for infectious or inflammatory colitis. Electronically Signed   By: Lupita RaiderJames  Green Jr, M.D.   On: 07/21/2016 21:15   I have reviewed abdominopelvic CT from this admission. Also reviewed studies from March 2016 and November 2014.  Assessment;  Patient is elderly Caucasian female who presents with few day history of abdominal pain and developed diarrhea while in emergency room was found to have changes of colitis involving descending and sigmoid colon.  She was also noted to have mild elevation of lactic acid. Patient was begun on Zosyn. Suspect we are dealing with ischemic colitis given that she had more or less similar presentation in March 2016. However she had C. difficile colitis in November 2014.Marland Kitchen. However we need to cover for infectious colitis until stool studies are negative.  Anemia. It appears she has chronic anemia.  Low serum albumin possibly secondary to acute illness but she could have chronic malnutrition. Nutritional history is not available from the patient.  Recommendations;  Stool studies to consist of GI  pathogen panel and C. Difficile. Enteric precautions until stool studies back. Discontinue Zosyn. Cipro 400 mg IV every 12 hours. Metronidazole 500 mg IV every 8 hours. Clear liquids. CBC and metabolic 7 in a.m.   LOS: 0 days   Najeeb Rehman  07/22/2016, 3:06 PM

## 2016-07-22 NOTE — Progress Notes (Signed)
ANTIBIOTIC CONSULT NOTE  Pharmacy Consult for Zosyn Indication: Intra-abdominal infection  No Known Allergies  Patient Measurements: Height: 5\' 6"  (167.6 cm) Weight: 125 lb 14.1 oz (57.1 kg) IBW/kg (Calculated) : 59.3  Vital Signs: Temp: 98.4 F (36.9 C) (12/31 0630) Temp Source: Oral (12/31 0630) BP: 96/56 (12/31 0654) Pulse Rate: 71 (12/31 0630)  Labs:  Recent Labs  07/21/16 1912 07/21/16 1921 07/22/16 0614  WBC 7.3  --  11.8*  HGB 10.2* 10.5* 8.3*  PLT 205  --  162  CREATININE 1.51* 1.70* 1.56*   Estimated Creatinine Clearance: 19.4 mL/min (by C-G formula based on SCr of 1.56 mg/dL (H)).  No results for input(s): VANCOTROUGH, VANCOPEAK, VANCORANDOM, GENTTROUGH, GENTPEAK, GENTRANDOM, TOBRATROUGH, TOBRAPEAK, TOBRARND, AMIKACINPEAK, AMIKACINTROU, AMIKACIN in the last 72 hours.   Microbiology: No results found for this or any previous visit (from the past 720 hour(s)).  Medical History: Past Medical History:  Diagnosis Date  . B12 deficiency   . C. difficile colitis Nov 2014   Treated with Flagyl  . Coronary artery disease   . GERD (gastroesophageal reflux disease)   . Hypercholesterolemia   . Hyperlipidemia   . Hypertension   . Hypothyroidism   . Osteoporosis   . Thyroid goiter    s/p surgery now on Synthroid  . Zenker diverticulum    Medications:  Zosyn 3.375 Gm IV in the ED  Assessment: 82 yo female admitted with abdominal pain, nausea and anorexia. Abdominal CT shows infectious or inflammatory colitis.   Goal of Therapy:  Eradicate infection  Plan:  Zosyn 3.375gm IV q8h, EID Monitor labs, renal fxn, progress and c/s  Valrie HartHall, [place] Voytko A, RPH 07/22/2016,8:28 AM

## 2016-07-22 NOTE — Progress Notes (Signed)
Subjective: She was admitted with what appears to be colitis. She has complaints of abdominal pain. She had some diarrhea but is not having diarrhea now. She says she's hungry.  Objective: Vital signs in last 24 hours: Temp:  [97.5 F (36.4 C)-98.6 F (37 C)] 98.4 F (36.9 C) (12/31 0630) Pulse Rate:  [71-113] 71 (12/31 0630) Resp:  [18-24] 18 (12/31 0630) BP: (94-147)/(56-91) 96/56 (12/31 0654) SpO2:  [97 %-100 %] 100 % (12/31 0630) Weight:  [54.4 kg (120 lb)-57.1 kg (125 lb 14.1 oz)] 57.1 kg (125 lb 14.1 oz) (12/30 2322) Weight change:  Last BM Date: 07/21/16  Intake/Output from previous day: 12/30 0701 - 12/31 0700 In: 891.3 [I.V.:291.3; IV Piggyback:600] Out: -   PHYSICAL EXAM General appearance: alert, cooperative and moderate distress Resp: clear to auscultation bilaterally Cardio: regular rate and rhythm, S1, S2 normal, no murmur, click, rub or gallop GI: She is fairly markedly tender in the left lower quadrant with no rebound. Bowel sounds are sluggish Extremities: extremities normal, atraumatic, no cyanosis or edema Skin warm and dry. Pupils react. Mucous membranes are dry  Lab Results:  Results for orders placed or performed during the hospital encounter of 07/21/16 (from the past 48 hour(s))  CBC with Differential     Status: Abnormal   Collection Time: 07/21/16  7:12 PM  Result Value Ref Range   WBC 7.3 4.0 - 10.5 K/uL   RBC 4.43 3.87 - 5.11 MIL/uL   Hemoglobin 10.2 (L) 12.0 - 15.0 g/dL   HCT 33.6 (L) 36.0 - 46.0 %   MCV 75.8 (L) 78.0 - 100.0 fL   MCH 23.0 (L) 26.0 - 34.0 pg   MCHC 30.4 30.0 - 36.0 g/dL   RDW 19.7 (H) 11.5 - 15.5 %   Platelets 205 150 - 400 K/uL   Neutrophils Relative % 68 %   Neutro Abs 4.9 1.7 - 7.7 K/uL   Lymphocytes Relative 28 %   Lymphs Abs 2.1 0.7 - 4.0 K/uL   Monocytes Relative 3 %   Monocytes Absolute 0.2 0.1 - 1.0 K/uL   Eosinophils Relative 1 %   Eosinophils Absolute 0.1 0.0 - 0.7 K/uL   Basophils Relative 0 %   Basophils  Absolute 0.0 0.0 - 0.1 K/uL  Comprehensive metabolic panel     Status: Abnormal   Collection Time: 07/21/16  7:12 PM  Result Value Ref Range   Sodium 138 135 - 145 mmol/L   Potassium 4.5 3.5 - 5.1 mmol/L   Chloride 107 101 - 111 mmol/L   CO2 20 (L) 22 - 32 mmol/L   Glucose, Bld 147 (H) 65 - 99 mg/dL   BUN 39 (H) 6 - 20 mg/dL   Creatinine, Ser 1.51 (H) 0.44 - 1.00 mg/dL   Calcium 9.7 8.9 - 10.3 mg/dL   Total Protein 7.2 6.5 - 8.1 g/dL   Albumin 3.5 3.5 - 5.0 g/dL   AST 30 15 - 41 U/L   ALT 13 (L) 14 - 54 U/L   Alkaline Phosphatase 92 38 - 126 U/L   Total Bilirubin 0.8 0.3 - 1.2 mg/dL   GFR calc non Af Amer 28 (L) >60 mL/min   GFR calc Af Amer 33 (L) >60 mL/min    Comment: (NOTE) The eGFR has been calculated using the CKD EPI equation. This calculation has not been validated in all clinical situations. eGFR's persistently <60 mL/min signify possible Chronic Kidney Disease.    Anion gap 11 5 - 15  I-stat chem 8,  ed     Status: Abnormal   Collection Time: 07/21/16  7:21 PM  Result Value Ref Range   Sodium 139 135 - 145 mmol/L   Potassium 4.6 3.5 - 5.1 mmol/L   Chloride 108 101 - 111 mmol/L   BUN 42 (H) 6 - 20 mg/dL   Creatinine, Ser 1.70 (H) 0.44 - 1.00 mg/dL   Glucose, Bld 146 (H) 65 - 99 mg/dL   Calcium, Ion 1.16 1.15 - 1.40 mmol/L   TCO2 21 0 - 100 mmol/L   Hemoglobin 10.5 (L) 12.0 - 15.0 g/dL   HCT 31.0 (L) 36.0 - 46.0 %  Glucose, capillary     Status: Abnormal   Collection Time: 07/21/16 11:53 PM  Result Value Ref Range   Glucose-Capillary 147 (H) 65 - 99 mg/dL   Comment 1 Notify RN    Comment 2 Document in Chart   Lactic acid, plasma     Status: Abnormal   Collection Time: 07/22/16  1:29 AM  Result Value Ref Range   Lactic Acid, Venous 2.5 (HH) 0.5 - 1.9 mmol/L    Comment: CRITICAL RESULT CALLED TO, READ BACK BY AND VERIFIED WITH:  HANDY,T @ 0256 ON 07/22/16 BY JUW   Comprehensive metabolic panel     Status: Abnormal   Collection Time: 07/22/16  6:14 AM   Result Value Ref Range   Sodium 138 135 - 145 mmol/L   Potassium 4.6 3.5 - 5.1 mmol/L   Chloride 110 101 - 111 mmol/L   CO2 21 (L) 22 - 32 mmol/L   Glucose, Bld 164 (H) 65 - 99 mg/dL   BUN 41 (H) 6 - 20 mg/dL   Creatinine, Ser 1.56 (H) 0.44 - 1.00 mg/dL   Calcium 8.0 (L) 8.9 - 10.3 mg/dL   Total Protein 5.0 (L) 6.5 - 8.1 g/dL   Albumin 2.4 (L) 3.5 - 5.0 g/dL   AST 21 15 - 41 U/L   ALT 10 (L) 14 - 54 U/L   Alkaline Phosphatase 63 38 - 126 U/L   Total Bilirubin 0.8 0.3 - 1.2 mg/dL   GFR calc non Af Amer 27 (L) >60 mL/min   GFR calc Af Amer 31 (L) >60 mL/min    Comment: (NOTE) The eGFR has been calculated using the CKD EPI equation. This calculation has not been validated in all clinical situations. eGFR's persistently <60 mL/min signify possible Chronic Kidney Disease.    Anion gap 7 5 - 15  CBC WITH DIFFERENTIAL     Status: Abnormal   Collection Time: 07/22/16  6:14 AM  Result Value Ref Range   WBC 11.8 (H) 4.0 - 10.5 K/uL   RBC 3.64 (L) 3.87 - 5.11 MIL/uL   Hemoglobin 8.3 (L) 12.0 - 15.0 g/dL    Comment: RESULT REPEATED AND VERIFIED DELTA CHECK NOTED    HCT 27.7 (L) 36.0 - 46.0 %   MCV 76.1 (L) 78.0 - 100.0 fL   MCH 22.8 (L) 26.0 - 34.0 pg   MCHC 30.0 30.0 - 36.0 g/dL   RDW 19.6 (H) 11.5 - 15.5 %   Platelets 162 150 - 400 K/uL   Neutrophils Relative % 89 %   Neutro Abs 10.5 (H) 1.7 - 7.7 K/uL   Lymphocytes Relative 7 %   Lymphs Abs 0.9 0.7 - 4.0 K/uL   Monocytes Relative 4 %   Monocytes Absolute 0.5 0.1 - 1.0 K/uL   Eosinophils Relative 0 %   Eosinophils Absolute 0.0 0.0 - 0.7 K/uL  Basophils Relative 0 %   Basophils Absolute 0.0 0.0 - 0.1 K/uL  Lactic acid, plasma     Status: Abnormal   Collection Time: 07/22/16  6:14 AM  Result Value Ref Range   Lactic Acid, Venous 2.1 (HH) 0.5 - 1.9 mmol/L    Comment: CRITICAL RESULT CALLED TO, READ BACK BY AND VERIFIED WITH: BULLIN,M AT 8:05AM ON 07/22/16 BY FESTERMAN,C   Glucose, capillary     Status: Abnormal    Collection Time: 07/22/16  6:35 AM  Result Value Ref Range   Glucose-Capillary 171 (H) 65 - 99 mg/dL   Comment 1 Notify RN    Comment 2 Document in Chart     ABGS  Recent Labs  07/21/16 1921  TCO2 21   CULTURES No results found for this or any previous visit (from the past 240 hour(s)). Studies/Results: Ct Abdomen Pelvis Wo Contrast  Result Date: 07/21/2016 CLINICAL DATA:  Generalized abdominal pain. EXAM: CT ABDOMEN AND PELVIS WITHOUT CONTRAST TECHNIQUE: Multidetector CT imaging of the abdomen and pelvis was performed following the standard protocol without IV contrast. COMPARISON:  CT scan of October 18, 2014. FINDINGS: Lower chest: Large hiatal hernia is noted. Visualized lung bases are unremarkable. Hepatobiliary: No focal liver abnormality is seen. No gallstones, gallbladder wall thickening, or biliary dilatation. Pancreas: Unremarkable. No pancreatic ductal dilatation or surrounding inflammatory changes. Spleen: Normal in size without focal abnormality. Adrenals/Urinary Tract: Adrenal glands are unremarkable. Mild left renal atrophy is noted. No hydronephrosis or renal obstruction is noted. No renal or ureteral calculi are noted. Urinary bladder appears normal. Stomach/Bowel: There is no evidence of bowel obstruction. Inflammatory changes are seen involving the descending colon an sigmoid colon concerning for colitis. Vascular/Lymphatic: Aortic atherosclerosis. No enlarged abdominal or pelvic lymph nodes. Reproductive: Status post hysterectomy. No adnexal masses. Other: No abdominal wall hernia or abnormality. No abdominopelvic ascites. Musculoskeletal: Severe multilevel degenerative disc disease is noted in the lumbar spine. IMPRESSION: Large hiatal hernia. Mild left renal atrophy. Aortic atherosclerosis. Inflammatory changes are seen surrounding the descending and sigmoid colon concerning for infectious or inflammatory colitis. Electronically Signed   By: Marijo Conception, M.D.   On:  07/21/2016 21:15    Medications:  Prior to Admission:  Prescriptions Prior to Admission  Medication Sig Dispense Refill Last Dose  . amLODipine (NORVASC) 5 MG tablet Take 5 mg by mouth daily after breakfast.    07/21/2016 at Unknown time  . aspirin 325 MG tablet Take 325 mg by mouth every morning.    07/21/2016 at Unknown time  . atorvastatin (LIPITOR) 10 MG tablet Take 10 mg by mouth every morning.    07/21/2016 at Unknown time  . lansoprazole (PREVACID) 30 MG capsule Take 1 capsule (30 mg total) by mouth daily. 30 capsule 11 07/21/2016 at Unknown time  . levothyroxine (SYNTHROID, LEVOTHROID) 112 MCG tablet Take 112 mcg by mouth daily before breakfast.   07/21/2016 at Unknown time  . quinapril (ACCUPRIL) 20 MG tablet Take 20 mg by mouth daily after breakfast.    07/21/2016 at Unknown time   Scheduled: . levothyroxine  66 mcg Intravenous QAC breakfast  . piperacillin-tazobactam (ZOSYN)  IV  2.25 g Intravenous Q8H   Continuous: . dextrose 5 % and 0.9% NaCl 75 mL/hr at 07/22/16 0355   BWL:[id number] **OR** acetaminophen, hydrALAZINE, ondansetron **OR** ondansetron (ZOFRAN) IV  Assesment: She was admitted with colitis. She is on IV fluids and antibiotics. She will need to continue that. I'm going to have her fully admitted from observation.  GI consultation has been requested. I don't think she's ready to take anything by mouth but she can have some ice chips. Principal Problem:   Colitis Active Problems:   HTN (hypertension), benign   Hypothyroidism    Plan: As above    LOS: 0 days   Joycelyn Liska L 07/22/2016, 11:12 AM

## 2016-07-22 NOTE — Progress Notes (Signed)
ANTIBIOTIC CONSULT NOTE-Preliminary  Pharmacy Consult for Zosyn Indication: Intra-abdominal infection  No Known Allergies  Patient Measurements: Height: 5\' 6"  (167.6 cm) Weight: 125 lb 14.1 oz (57.1 kg) IBW/kg (Calculated) : 59.3  Vital Signs: Temp: 98.6 F (37 C) (12/30 2322) Temp Source: Oral (12/30 2322) BP: 109/56 (12/30 2322) Pulse Rate: 85 (12/30 2322)  Labs:  Recent Labs  07/21/16 1912 07/21/16 1921  WBC 7.3  --   HGB 10.2* 10.5*  PLT 205  --   CREATININE 1.51* 1.70*    Estimated Creatinine Clearance: 17.8 mL/min (by C-G formula based on SCr of 1.7 mg/dL (H)).  No results for input(s): VANCOTROUGH, VANCOPEAK, VANCORANDOM, GENTTROUGH, GENTPEAK, GENTRANDOM, TOBRATROUGH, TOBRAPEAK, TOBRARND, AMIKACINPEAK, AMIKACINTROU, AMIKACIN in the last 72 hours.   Microbiology: No results found for this or any previous visit (from the past 720 hour(s)).  Medical History: Past Medical History:  Diagnosis Date  . B12 deficiency   . C. difficile colitis Nov 2014   Treated with Flagyl  . Coronary artery disease   . GERD (gastroesophageal reflux disease)   . Hypercholesterolemia   . Hyperlipidemia   . Hypertension   . Hypothyroidism   . Osteoporosis   . Thyroid goiter    s/p surgery now on Synthroid  . Zenker diverticulum     Medications:  Zosyn 3.375 Gm IV in the ED  Assessment: 82 yo female admitted with abdominal pain, nausea and anorexia. Abdominal CT shows infectious or inflammatory colitis.   Goal of Therapy:  Eradicate infection  Plan:  Preliminary review of pertinent patient information completed.  Protocol will be initiated with a one-time dose of Zosyn 3.375 Gm IV 8 hours after the initial ED dose.Pattricia Boss.  [place] clinical pharmacist will complete review during morning rounds to assess patient and finalize treatment regimen.  Arelia SneddonMason, Natalynn Pedone Anne, RPH 07/22/2016,1:17 AM

## 2016-07-22 NOTE — Progress Notes (Signed)
CRITICAL VALUE ALERT  Critical value received:  Lactic acid 2.5  Date of notification:  07/22/16  Time of notification:  0253  Critical value read back:Yes.    Nurse who received alert:  Cristela Blue Natanel Snavely   MD notified (1st page):  Toniann FailKakrakandy  Time of first page:  0258  MD notified (2nd page):  Time of second page:  Responding MD: Toniann FailKakrakandy  Time MD responded: [phone]

## 2016-07-23 DIAGNOSIS — K529 Noninfective gastroenteritis and colitis, unspecified: Secondary | ICD-10-CM

## 2016-07-23 LAB — CBC WITH DIFFERENTIAL/PLATELET
BASOS ABS: 0 10*3/uL (ref 0.0–0.1)
Basophils Relative: 0 %
EOS ABS: 0.1 10*3/uL (ref 0.0–0.7)
Eosinophils Relative: 1 %
HCT: 25.2 % — ABNORMAL LOW (ref 36.0–46.0)
Hemoglobin: 7.6 g/dL — ABNORMAL LOW (ref 12.0–15.0)
Lymphocytes Relative: 16 %
Lymphs Abs: 1.4 10*3/uL (ref 0.7–4.0)
MCH: 23.2 pg — ABNORMAL LOW (ref 26.0–34.0)
MCHC: 30.2 g/dL (ref 30.0–36.0)
MCV: 76.8 fL — ABNORMAL LOW (ref 78.0–100.0)
Monocytes Absolute: 0.7 10*3/uL (ref 0.1–1.0)
Monocytes Relative: 8 %
NEUTROS PCT: 75 %
Neutro Abs: 6.7 10*3/uL (ref 1.7–7.7)
PLATELETS: 144 10*3/uL — AB (ref 150–400)
RBC: 3.28 MIL/uL — AB (ref 3.87–5.11)
RDW: 20.2 % — ABNORMAL HIGH (ref 11.5–15.5)
WBC: 8.9 10*3/uL (ref 4.0–10.5)

## 2016-07-23 LAB — BASIC METABOLIC PANEL
Anion gap: 3 — ABNORMAL LOW (ref 5–15)
BUN: 30 mg/dL — ABNORMAL HIGH (ref 6–20)
CO2: 23 mmol/L (ref 22–32)
Calcium: 7.5 mg/dL — ABNORMAL LOW (ref 8.9–10.3)
Chloride: 113 mmol/L — ABNORMAL HIGH (ref 101–111)
Creatinine, Ser: 1.3 mg/dL — ABNORMAL HIGH (ref 0.44–1.00)
GFR, EST AFRICAN AMERICAN: 39 mL/min — AB (ref >60)
GFR, EST NON AFRICAN AMERICAN: 34 mL/min — AB (ref >60)
Glucose, Bld: 111 mg/dL — ABNORMAL HIGH (ref 65–99)
POTASSIUM: 3.6 mmol/L (ref 3.5–5.1)
SODIUM: 139 mmol/L (ref 135–145)

## 2016-07-23 LAB — GLUCOSE, CAPILLARY
GLUCOSE-CAPILLARY: 112 mg/dL — AB (ref 65–99)
Glucose-Capillary: 112 mg/dL — ABNORMAL HIGH (ref 65–99)
Glucose-Capillary: 99 mg/dL (ref 65–99)

## 2016-07-23 NOTE — Progress Notes (Signed)
Subjective: She says she feels better. She is less confused. Her abdominal pain is better. No chest pain no fever or chills. She had 13 beat run of V. tach which was asymptomatic.  Objective: Vital signs in last 24 hours: Temp:  [98.5 F (36.9 C)-99.2 F (37.3 C)] 98.5 F (36.9 C) (01/01 0601) Pulse Rate:  [76-80] 80 (01/01 0601) Resp:  [16-18] 18 (01/01 0601) BP: (105-113)/(45-65) 106/65 (01/01 0601) SpO2:  [94 %-95 %] 95 % (01/01 0601) Weight change:  Last BM Date: 07/22/16  Intake/Output from previous day: 12/31 0701 - 01/01 0700 In: 1437.5 [P.O.:220; I.V.:817.5; IV Piggyback:400] Out: -   PHYSICAL EXAM General appearance: alert, cooperative and mild distress Resp: clear to auscultation bilaterally Cardio: regular rate and rhythm, S1, S2 normal, no murmur, click, rub or gallop GI: Her abdomen is much softer. Bowel sounds are present. Extremities: extremities normal, atraumatic, no cyanosis or edema She is very hard of hearing. Pupils react. Mucous membranes are moist  Lab Results:  Results for orders placed or performed during the hospital encounter of 07/21/16 (from the past 48 hour(s))  CBC with Differential     Status: Abnormal   Collection Time: 07/21/16  7:12 PM  Result Value Ref Range   WBC 7.3 4.0 - 10.5 K/uL   RBC 4.43 3.87 - 5.11 MIL/uL   Hemoglobin 10.2 (L) 12.0 - 15.0 g/dL   HCT 33.6 (L) 36.0 - 46.0 %   MCV 75.8 (L) 78.0 - 100.0 fL   MCH 23.0 (L) 26.0 - 34.0 pg   MCHC 30.4 30.0 - 36.0 g/dL   RDW 19.7 (H) 11.5 - 15.5 %   Platelets 205 150 - 400 K/uL   Neutrophils Relative % 68 %   Neutro Abs 4.9 1.7 - 7.7 K/uL   Lymphocytes Relative 28 %   Lymphs Abs 2.1 0.7 - 4.0 K/uL   Monocytes Relative 3 %   Monocytes Absolute 0.2 0.1 - 1.0 K/uL   Eosinophils Relative 1 %   Eosinophils Absolute 0.1 0.0 - 0.7 K/uL   Basophils Relative 0 %   Basophils Absolute 0.0 0.0 - 0.1 K/uL  Comprehensive metabolic panel     Status: Abnormal   Collection Time: 07/21/16  7:12  PM  Result Value Ref Range   Sodium 138 135 - 145 mmol/L   Potassium 4.5 3.5 - 5.1 mmol/L   Chloride 107 101 - 111 mmol/L   CO2 20 (L) 22 - 32 mmol/L   Glucose, Bld 147 (H) 65 - 99 mg/dL   BUN 39 (H) 6 - 20 mg/dL   Creatinine, Ser 1.51 (H) 0.44 - 1.00 mg/dL   Calcium 9.7 8.9 - 10.3 mg/dL   Total Protein 7.2 6.5 - 8.1 g/dL   Albumin 3.5 3.5 - 5.0 g/dL   AST 30 15 - 41 U/L   ALT 13 (L) 14 - 54 U/L   Alkaline Phosphatase 92 38 - 126 U/L   Total Bilirubin 0.8 0.3 - 1.2 mg/dL   GFR calc non Af Amer 28 (L) >60 mL/min   GFR calc Af Amer 33 (L) >60 mL/min    Comment: (NOTE) The eGFR has been calculated using the CKD EPI equation. This calculation has not been validated in all clinical situations. eGFR's persistently <60 mL/min signify possible Chronic Kidney Disease.    Anion gap 11 5 - 15  I-stat chem 8, ed     Status: Abnormal   Collection Time: 07/21/16  7:21 PM  Result Value Ref Range  Sodium 139 135 - 145 mmol/L   Potassium 4.6 3.5 - 5.1 mmol/L   Chloride 108 101 - 111 mmol/L   BUN 42 (H) 6 - 20 mg/dL   Creatinine, Ser 1.70 (H) 0.44 - 1.00 mg/dL   Glucose, Bld 146 (H) 65 - 99 mg/dL   Calcium, Ion 1.16 1.15 - 1.40 mmol/L   TCO2 21 0 - 100 mmol/L   Hemoglobin 10.5 (L) 12.0 - 15.0 g/dL   HCT 31.0 (L) 36.0 - 46.0 %  Glucose, capillary     Status: Abnormal   Collection Time: 07/21/16 11:53 PM  Result Value Ref Range   Glucose-Capillary 147 (H) 65 - 99 mg/dL   Comment 1 Notify RN    Comment 2 Document in Chart   Lactic acid, plasma     Status: Abnormal   Collection Time: 07/22/16  1:29 AM  Result Value Ref Range   Lactic Acid, Venous 2.5 (HH) 0.5 - 1.9 mmol/L    Comment: CRITICAL RESULT CALLED TO, READ BACK BY AND VERIFIED WITH:  HANDY,T @ 0256 ON 07/22/16 BY JUW   Comprehensive metabolic panel     Status: Abnormal   Collection Time: 07/22/16  6:14 AM  Result Value Ref Range   Sodium 138 135 - 145 mmol/L   Potassium 4.6 3.5 - 5.1 mmol/L   Chloride 110 101 - 111 mmol/L    CO2 21 (L) 22 - 32 mmol/L   Glucose, Bld 164 (H) 65 - 99 mg/dL   BUN 41 (H) 6 - 20 mg/dL   Creatinine, Ser 1.56 (H) 0.44 - 1.00 mg/dL   Calcium 8.0 (L) 8.9 - 10.3 mg/dL   Total Protein 5.0 (L) 6.5 - 8.1 g/dL   Albumin 2.4 (L) 3.5 - 5.0 g/dL   AST 21 15 - 41 U/L   ALT 10 (L) 14 - 54 U/L   Alkaline Phosphatase 63 38 - 126 U/L   Total Bilirubin 0.8 0.3 - 1.2 mg/dL   GFR calc non Af Amer 27 (L) >60 mL/min   GFR calc Af Amer 31 (L) >60 mL/min    Comment: (NOTE) The eGFR has been calculated using the CKD EPI equation. This calculation has not been validated in all clinical situations. eGFR's persistently <60 mL/min signify possible Chronic Kidney Disease.    Anion gap 7 5 - 15  CBC WITH DIFFERENTIAL     Status: Abnormal   Collection Time: 07/22/16  6:14 AM  Result Value Ref Range   WBC 11.8 (H) 4.0 - 10.5 K/uL   RBC 3.64 (L) 3.87 - 5.11 MIL/uL   Hemoglobin 8.3 (L) 12.0 - 15.0 g/dL    Comment: RESULT REPEATED AND VERIFIED DELTA CHECK NOTED    HCT 27.7 (L) 36.0 - 46.0 %   MCV 76.1 (L) 78.0 - 100.0 fL   MCH 22.8 (L) 26.0 - 34.0 pg   MCHC 30.0 30.0 - 36.0 g/dL   RDW 19.6 (H) 11.5 - 15.5 %   Platelets 162 150 - 400 K/uL   Neutrophils Relative % 89 %   Neutro Abs 10.5 (H) 1.7 - 7.7 K/uL   Lymphocytes Relative 7 %   Lymphs Abs 0.9 0.7 - 4.0 K/uL   Monocytes Relative 4 %   Monocytes Absolute 0.5 0.1 - 1.0 K/uL   Eosinophils Relative 0 %   Eosinophils Absolute 0.0 0.0 - 0.7 K/uL   Basophils Relative 0 %   Basophils Absolute 0.0 0.0 - 0.1 K/uL  Lactic acid, plasma  Status: Abnormal   Collection Time: 07/22/16  6:14 AM  Result Value Ref Range   Lactic Acid, Venous 2.1 (HH) 0.5 - 1.9 mmol/L    Comment: CRITICAL RESULT CALLED TO, READ BACK BY AND VERIFIED WITH: BULLIN,M AT 8:05AM ON 07/22/16 BY FESTERMAN,C   Glucose, capillary     Status: Abnormal   Collection Time: 07/22/16  6:35 AM  Result Value Ref Range   Glucose-Capillary 171 (H) 65 - 99 mg/dL   Comment 1 Notify RN     Comment 2 Document in Chart   Glucose, capillary     Status: Abnormal   Collection Time: 07/22/16 11:53 AM  Result Value Ref Range   Glucose-Capillary 134 (H) 65 - 99 mg/dL  Glucose, capillary     Status: Abnormal   Collection Time: 07/22/16  5:19 PM  Result Value Ref Range   Glucose-Capillary 138 (H) 65 - 99 mg/dL  CBC with Differential/Platelet     Status: Abnormal   Collection Time: 07/23/16  6:18 AM  Result Value Ref Range   WBC 8.9 4.0 - 10.5 K/uL   RBC 3.28 (L) 3.87 - 5.11 MIL/uL   Hemoglobin 7.6 (L) 12.0 - 15.0 g/dL   HCT 25.2 (L) 36.0 - 46.0 %   MCV 76.8 (L) 78.0 - 100.0 fL   MCH 23.2 (L) 26.0 - 34.0 pg   MCHC 30.2 30.0 - 36.0 g/dL   RDW 20.2 (H) 11.5 - 15.5 %   Platelets 144 (L) 150 - 400 K/uL   Neutrophils Relative % 75 %   Neutro Abs 6.7 1.7 - 7.7 K/uL   Lymphocytes Relative 16 %   Lymphs Abs 1.4 0.7 - 4.0 K/uL   Monocytes Relative 8 %   Monocytes Absolute 0.7 0.1 - 1.0 K/uL   Eosinophils Relative 1 %   Eosinophils Absolute 0.1 0.0 - 0.7 K/uL   Basophils Relative 0 %   Basophils Absolute 0.0 0.0 - 0.1 K/uL  Basic metabolic panel     Status: Abnormal   Collection Time: 07/23/16  6:18 AM  Result Value Ref Range   Sodium 139 135 - 145 mmol/L   Potassium 3.6 3.5 - 5.1 mmol/L    Comment: DELTA CHECK NOTED   Chloride 113 (H) 101 - 111 mmol/L   CO2 23 22 - 32 mmol/L   Glucose, Bld 111 (H) 65 - 99 mg/dL   BUN 30 (H) 6 - 20 mg/dL   Creatinine, Ser 1.30 (H) 0.44 - 1.00 mg/dL   Calcium 7.5 (L) 8.9 - 10.3 mg/dL   GFR calc non Af Amer 34 (L) >60 mL/min   GFR calc Af Amer 39 (L) >60 mL/min    Comment: (NOTE) The eGFR has been calculated using the CKD EPI equation. This calculation has not been validated in all clinical situations. eGFR's persistently <60 mL/min signify possible Chronic Kidney Disease.    Anion gap 3 (L) 5 - 15  Glucose, capillary     Status: Abnormal   Collection Time: 07/23/16  6:43 AM  Result Value Ref Range   Glucose-Capillary 112 (H) 65 - 99  mg/dL   Comment 1 Notify RN    Comment 2 Document in Chart     ABGS  Recent Labs  07/21/16 1921  TCO2 21   CULTURES No results found for this or any previous visit (from the past 240 hour(s)). Studies/Results: Ct Abdomen Pelvis Wo Contrast  Result Date: 07/21/2016 CLINICAL DATA:  Generalized abdominal pain. EXAM: CT ABDOMEN AND PELVIS WITHOUT CONTRAST  TECHNIQUE: Multidetector CT imaging of the abdomen and pelvis was performed following the standard protocol without IV contrast. COMPARISON:  CT scan of October 18, 2014. FINDINGS: Lower chest: Large hiatal hernia is noted. Visualized lung bases are unremarkable. Hepatobiliary: No focal liver abnormality is seen. No gallstones, gallbladder wall thickening, or biliary dilatation. Pancreas: Unremarkable. No pancreatic ductal dilatation or surrounding inflammatory changes. Spleen: Normal in size without focal abnormality. Adrenals/Urinary Tract: Adrenal glands are unremarkable. Mild left renal atrophy is noted. No hydronephrosis or renal obstruction is noted. No renal or ureteral calculi are noted. Urinary bladder appears normal. Stomach/Bowel: There is no evidence of bowel obstruction. Inflammatory changes are seen involving the descending colon an sigmoid colon concerning for colitis. Vascular/Lymphatic: Aortic atherosclerosis. No enlarged abdominal or pelvic lymph nodes. Reproductive: Status post hysterectomy. No adnexal masses. Other: No abdominal wall hernia or abnormality. No abdominopelvic ascites. Musculoskeletal: Severe multilevel degenerative disc disease is noted in the lumbar spine. IMPRESSION: Large hiatal hernia. Mild left renal atrophy. Aortic atherosclerosis. Inflammatory changes are seen surrounding the descending and sigmoid colon concerning for infectious or inflammatory colitis. Electronically Signed   By: Marijo Conception, M.D.   On: 07/21/2016 21:15    Medications:  Prior to Admission:  Prescriptions Prior to Admission   Medication Sig Dispense Refill Last Dose  . amLODipine (NORVASC) 5 MG tablet Take 5 mg by mouth daily after breakfast.    07/21/2016 at Unknown time  . aspirin 325 MG tablet Take 325 mg by mouth every morning.    07/21/2016 at Unknown time  . atorvastatin (LIPITOR) 10 MG tablet Take 10 mg by mouth every morning.    07/21/2016 at Unknown time  . lansoprazole (PREVACID) 30 MG capsule Take 1 capsule (30 mg total) by mouth daily. 30 capsule 11 07/21/2016 at Unknown time  . levothyroxine (SYNTHROID, LEVOTHROID) 112 MCG tablet Take 112 mcg by mouth daily before breakfast.   07/21/2016 at Unknown time  . quinapril (ACCUPRIL) 20 MG tablet Take 20 mg by mouth daily after breakfast.    07/21/2016 at Unknown time   Scheduled: . ciprofloxacin  400 mg Intravenous Q24H  . levothyroxine  66 mcg Intravenous QAC breakfast  . metronidazole  500 mg Intravenous Q8H   Continuous:  [id number] **OR** acetaminophen, hydrALAZINE, ondansetron **OR** ondansetron (ZOFRAN) IV  Assesment: She was admitted with colitis. She has hypertension at baseline. She's been treated for the colitis and is better. She had asymptomatic V. tach will leave the monitor on today and if she is stable remove it tomorrow. She does not have cardiac issues generally. Principal Problem:   Colitis Active Problems:   HTN (hypertension), benign   Hypothyroidism    Plan: Continue current treatments. Advance her diet. Since she is anemic I'm going to check stools for blood.    LOS: 1 day   Izan Miron L 07/23/2016, 11:00 AM

## 2016-07-23 NOTE — Progress Notes (Signed)
Patient ID: Elizabeth [name], female   DOB: 02/08/1919, 83 y.o.   MRN: [id number]   Assessment/Plan: ADMITTED WITH ABDOMINAL PAIN AND CONSTIPATION FOLLOWED BY LOOSE STOOLS AND LIKELY DUE TO ISCHEMIC COLITIS. Tolerating pos. CLINICALLY IMPROVED.  PLAN: 1. SUPPORTIVE CARE 2. ADVANCE DIET 3. AWAIT STOOL PANEL 4. CONTINUE CIP/FLAGYL   Subjective: Since last evaluated the patient PT IS TOLERATING FULL LIQUIDS. MILD ABDOMINAL PAIN. NO NAUSEA OR VOMITING. NO BRBPR OR MELENA. LAST EGD 2015. LAST TCS UNKNOWN.  Objective: Vital signs in last 24 hours: Vitals:   07/22/16 2040 07/23/16 0601  BP: (!) 105/45 106/65  Pulse: 76 80  Resp: 18 18  Temp: 98.9 F (37.2 C) 98.5 F (36.9 C)   General appearance: alert, cooperative and no distress Resp: clear to auscultation bilaterally Cardio: regular rate and IRREGULAR rhythm GI: soft, non-tender; bowel sounds normal; MILD TENDERNESS IN R PERIUMBILICAL REGION, NO REBOUND OR GUARDING NEURO: PT IS HARD OF HEARING, NO  NEW FOCAL DEFICITS  Lab Results:       DEC 30 JAN 1 PENDING STOOL PANEL K 3.6 Cr 1.30 Hb  10.2  7.6   Studies/Results: CT ABD/PELVIS WO IVC DEC 30 Inflammatory changes are seen surrounding the descending and sigmoid colon concerning for infectious or inflammatory colitis.  Medications: I have reviewed the patient's current medications.   LOS: 5 days   Jonette EvaSandi Jonn Chaikin 12/31/2013, 2:23 PM

## 2016-07-24 ENCOUNTER — Encounter (HOSPITAL_COMMUNITY): Payer: Self-pay | Admitting: Gastroenterology

## 2016-07-24 DIAGNOSIS — D649 Anemia, unspecified: Secondary | ICD-10-CM

## 2016-07-24 LAB — BASIC METABOLIC PANEL
ANION GAP: 5 (ref 5–15)
BUN: 20 mg/dL (ref 6–20)
CHLORIDE: 111 mmol/L (ref 101–111)
CO2: 23 mmol/L (ref 22–32)
Calcium: 7.3 mg/dL — ABNORMAL LOW (ref 8.9–10.3)
Creatinine, Ser: 1.08 mg/dL — ABNORMAL HIGH (ref 0.44–1.00)
GFR calc non Af Amer: 42 mL/min — ABNORMAL LOW (ref >60)
GFR, EST AFRICAN AMERICAN: 49 mL/min — AB (ref >60)
Glucose, Bld: 100 mg/dL — ABNORMAL HIGH (ref 65–99)
POTASSIUM: 3.6 mmol/L (ref 3.5–5.1)
SODIUM: 139 mmol/L (ref 135–145)

## 2016-07-24 LAB — CBC WITH DIFFERENTIAL/PLATELET
BASOS PCT: 0 %
Basophils Absolute: 0 10*3/uL (ref 0.0–0.1)
EOS ABS: 0.2 10*3/uL (ref 0.0–0.7)
Eosinophils Relative: 2 %
HCT: 25.2 % — ABNORMAL LOW (ref 36.0–46.0)
HEMOGLOBIN: 7.5 g/dL — AB (ref 12.0–15.0)
LYMPHS ABS: 1 10*3/uL (ref 0.7–4.0)
Lymphocytes Relative: 15 %
MCH: 22.9 pg — ABNORMAL LOW (ref 26.0–34.0)
MCHC: 29.8 g/dL — ABNORMAL LOW (ref 30.0–36.0)
MCV: 76.8 fL — ABNORMAL LOW (ref 78.0–100.0)
MONOS PCT: 6 %
Monocytes Absolute: 0.4 10*3/uL (ref 0.1–1.0)
NEUTROS ABS: 5.1 10*3/uL (ref 1.7–7.7)
NEUTROS PCT: 77 %
Platelets: 134 10*3/uL — ABNORMAL LOW (ref 150–400)
RBC: 3.28 MIL/uL — AB (ref 3.87–5.11)
RDW: 19.8 % — ABNORMAL HIGH (ref 11.5–15.5)
WBC: 6.7 10*3/uL (ref 4.0–10.5)

## 2016-07-24 LAB — FERRITIN: Ferritin: 32 ng/mL (ref 11–307)

## 2016-07-24 LAB — GLUCOSE, CAPILLARY
GLUCOSE-CAPILLARY: 105 mg/dL — AB (ref 65–99)
GLUCOSE-CAPILLARY: 157 mg/dL — AB (ref 65–99)
GLUCOSE-CAPILLARY: 94 mg/dL (ref 65–99)
Glucose-Capillary: 93 mg/dL (ref 65–99)

## 2016-07-24 LAB — IRON AND TIBC
Iron: 8 ug/dL — ABNORMAL LOW (ref 28–170)
SATURATION RATIOS: 3 % — AB (ref 10.4–31.8)
TIBC: 253 ug/dL (ref 250–450)
UIBC: 245 ug/dL

## 2016-07-24 MED ORDER — CIPROFLOXACIN HCL 250 MG PO TABS
500.0000 mg | ORAL_TABLET | Freq: Every day | ORAL | Status: DC
  Administered 2016-07-24 – 2016-07-25 (×2): 500 mg via ORAL
  Filled 2016-07-24 (×2): qty 2

## 2016-07-24 MED ORDER — METRONIDAZOLE 500 MG PO TABS
500.0000 mg | ORAL_TABLET | Freq: Three times a day (TID) | ORAL | Status: DC
  Administered 2016-07-24 – 2016-07-25 (×3): 500 mg via ORAL
  Filled 2016-07-24 (×3): qty 1

## 2016-07-24 NOTE — Progress Notes (Signed)
PHARMACY NOTE:  ANTIMICROBIAL RENAL DOSAGE ADJUSTMENT  Current antimicrobial regimen includes a mismatch between antimicrobial dosage and estimated renal function.  As per policy approved by the Pharmacy & Therapeutics and Medical Executive Committees, the antimicrobial dosage will be adjusted accordingly.  Current antimicrobial dosage:  Cipro 500 mg po bid  Indication: colitis  Renal Function:  Estimated Creatinine Clearance: 28.1 mL/min (by C-G formula based on SCr of 1.08 mg/dL (H)). []      On intermittent HD, scheduled: []      On CRRT    Antimicrobial dosage has been changed to:  Cipro 500 mg daily  Additional comments:   Thank you for allowing pharmacy to be a part of this patient's care.  Mosetta Pigeonittman, Shyler Holzman [place], [facility] HospitalRPH 07/24/2016 8:38 AM

## 2016-07-24 NOTE — Progress Notes (Signed)
Subjective: She is doing better. Her diet has been advanced and she's eating solid foods now. She continues to have some loose stools but stool panel has not been able to be obtained because she urinates when she has a BM. She's not complaining of any abdominal pain now. No chest pain nausea or vomiting. She is very hard of hearing so accuracy of above is questionable  Objective: Vital signs in last 24 hours: Temp:  [97.8 F (36.6 C)-99.3 F (37.4 C)] 99.3 F (37.4 C) (01/02 0532) Pulse Rate:  [75-84] 84 (01/02 0532) Resp:  [18] 18 (01/02 0532) BP: (109-123)/(52-72) 123/52 (01/02 0532) SpO2:  [95 %-100 %] 95 % (01/02 0532) Weight change:  Last BM Date: 07/23/16  Intake/Output from previous day: 01/01 0701 - 01/02 0700 In: 900 [P.O.:600; IV Piggyback:300] Out: -   PHYSICAL EXAM General appearance: alert, cooperative and no distress Resp: clear to auscultation bilaterally Cardio: regular rate and rhythm, S1, S2 normal, no murmur, click, rub or gallop GI: Only minimal tenderness Extremities: extremities normal, atraumatic, no cyanosis or edema Skin warm and dry. Mucous membranes are moist  Lab Results:  Results for orders placed or performed during the hospital encounter of 07/21/16 (from the past 48 hour(s))  Glucose, capillary     Status: Abnormal   Collection Time: 07/22/16 11:53 AM  Result Value Ref Range   Glucose-Capillary 134 (H) 65 - 99 mg/dL  Glucose, capillary     Status: Abnormal   Collection Time: 07/22/16  5:19 PM  Result Value Ref Range   Glucose-Capillary 138 (H) 65 - 99 mg/dL  Glucose, capillary     Status: Abnormal   Collection Time: 07/23/16  1:01 AM  Result Value Ref Range   Glucose-Capillary 112 (H) 65 - 99 mg/dL   Comment 1 Notify RN    Comment 2 Document in Chart   CBC with Differential/Platelet     Status: Abnormal   Collection Time: 07/23/16  6:18 AM  Result Value Ref Range   WBC 8.9 4.0 - 10.5 K/uL   RBC 3.28 (L) 3.87 - 5.11 MIL/uL   Hemoglobin  7.6 (L) 12.0 - 15.0 g/dL   HCT 25.2 (L) 36.0 - 46.0 %   MCV 76.8 (L) 78.0 - 100.0 fL   MCH 23.2 (L) 26.0 - 34.0 pg   MCHC 30.2 30.0 - 36.0 g/dL   RDW 20.2 (H) 11.5 - 15.5 %   Platelets 144 (L) 150 - 400 K/uL   Neutrophils Relative % 75 %   Neutro Abs 6.7 1.7 - 7.7 K/uL   Lymphocytes Relative 16 %   Lymphs Abs 1.4 0.7 - 4.0 K/uL   Monocytes Relative 8 %   Monocytes Absolute 0.7 0.1 - 1.0 K/uL   Eosinophils Relative 1 %   Eosinophils Absolute 0.1 0.0 - 0.7 K/uL   Basophils Relative 0 %   Basophils Absolute 0.0 0.0 - 0.1 K/uL  Basic metabolic panel     Status: Abnormal   Collection Time: 07/23/16  6:18 AM  Result Value Ref Range   Sodium 139 135 - 145 mmol/L   Potassium 3.6 3.5 - 5.1 mmol/L    Comment: DELTA CHECK NOTED   Chloride 113 (H) 101 - 111 mmol/L   CO2 23 22 - 32 mmol/L   Glucose, Bld 111 (H) 65 - 99 mg/dL   BUN 30 (H) 6 - 20 mg/dL   Creatinine, Ser 1.30 (H) 0.44 - 1.00 mg/dL   Calcium 7.5 (L) 8.9 - 10.3 mg/dL  GFR calc non Af Amer 34 (L) >60 mL/min   GFR calc Af Amer 39 (L) >60 mL/min    Comment: (NOTE) The eGFR has been calculated using the CKD EPI equation. This calculation has not been validated in all clinical situations. eGFR's persistently <60 mL/min signify possible Chronic Kidney Disease.    Anion gap 3 (L) 5 - 15  Glucose, capillary     Status: Abnormal   Collection Time: 07/23/16  6:43 AM  Result Value Ref Range   Glucose-Capillary 112 (H) 65 - 99 mg/dL   Comment 1 Notify RN    Comment 2 Document in Chart   Glucose, capillary     Status: None   Collection Time: 07/23/16 12:07 PM  Result Value Ref Range   Glucose-Capillary 99 65 - 99 mg/dL  Glucose, capillary     Status: None   Collection Time: 07/24/16 12:08 AM  Result Value Ref Range   Glucose-Capillary 94 65 - 99 mg/dL   Comment 1 Notify RN    Comment 2 Document in Chart   Glucose, capillary     Status: None   Collection Time: 07/24/16  6:45 AM  Result Value Ref Range   Glucose-Capillary 93  65 - 99 mg/dL   Comment 1 Notify RN    Comment 2 Document in Chart   CBC with Differential/Platelet     Status: Abnormal   Collection Time: 07/24/16  7:23 AM  Result Value Ref Range   WBC 6.7 4.0 - 10.5 K/uL   RBC 3.28 (L) 3.87 - 5.11 MIL/uL   Hemoglobin 7.5 (L) 12.0 - 15.0 g/dL   HCT 25.2 (L) 36.0 - 46.0 %   MCV 76.8 (L) 78.0 - 100.0 fL   MCH 22.9 (L) 26.0 - 34.0 pg   MCHC 29.8 (L) 30.0 - 36.0 g/dL   RDW 19.8 (H) 11.5 - 15.5 %   Platelets 134 (L) 150 - 400 K/uL   Neutrophils Relative % 77 %   Neutro Abs 5.1 1.7 - 7.7 K/uL   Lymphocytes Relative 15 %   Lymphs Abs 1.0 0.7 - 4.0 K/uL   Monocytes Relative 6 %   Monocytes Absolute 0.4 0.1 - 1.0 K/uL   Eosinophils Relative 2 %   Eosinophils Absolute 0.2 0.0 - 0.7 K/uL   Basophils Relative 0 %   Basophils Absolute 0.0 0.0 - 0.1 K/uL  Basic metabolic panel     Status: Abnormal   Collection Time: 07/24/16  7:23 AM  Result Value Ref Range   Sodium 139 135 - 145 mmol/L   Potassium 3.6 3.5 - 5.1 mmol/L   Chloride 111 101 - 111 mmol/L   CO2 23 22 - 32 mmol/L   Glucose, Bld 100 (H) 65 - 99 mg/dL   BUN 20 6 - 20 mg/dL   Creatinine, Ser 1.08 (H) 0.44 - 1.00 mg/dL   Calcium 7.3 (L) 8.9 - 10.3 mg/dL   GFR calc non Af Amer 42 (L) >60 mL/min   GFR calc Af Amer 49 (L) >60 mL/min    Comment: (NOTE) The eGFR has been calculated using the CKD EPI equation. This calculation has not been validated in all clinical situations. eGFR's persistently <60 mL/min signify possible Chronic Kidney Disease.    Anion gap 5 5 - 15    ABGS  Recent Labs  07/21/16 1921  TCO2 21   CULTURES No results found for this or any previous visit (from the past 240 hour(s)). Studies/Results: No results found.  Medications:  Prior to Admission:  Prescriptions Prior to Admission  Medication Sig Dispense Refill Last Dose  . amLODipine (NORVASC) 5 MG tablet Take 5 mg by mouth daily after breakfast.    07/21/2016 at Unknown time  . aspirin 325 MG tablet Take  325 mg by mouth every morning.    07/21/2016 at Unknown time  . atorvastatin (LIPITOR) 10 MG tablet Take 10 mg by mouth every morning.    07/21/2016 at Unknown time  . lansoprazole (PREVACID) 30 MG capsule Take 1 capsule (30 mg total) by mouth daily. 30 capsule 11 07/21/2016 at Unknown time  . levothyroxine (SYNTHROID, LEVOTHROID) 112 MCG tablet Take 112 mcg by mouth daily before breakfast.   07/21/2016 at Unknown time  . quinapril (ACCUPRIL) 20 MG tablet Take 20 mg by mouth daily after breakfast.    07/21/2016 at Unknown time   Scheduled: . ciprofloxacin  400 mg Intravenous Q24H  . levothyroxine  66 mcg Intravenous QAC breakfast  . metronidazole  500 mg Intravenous Q8H   Continuous:  [id number] **OR** acetaminophen, hydrALAZINE, ondansetron **OR** ondansetron (ZOFRAN) IV  Assesment: She was admitted with colitis presumably ischemic. She is on antibiotics as well. She is improving. She's able to eat regular food now so I'm going to switch her to oral antibiotics and if she does well with that probably able to be discharged tomorrow Principal Problem:   Colitis Active Problems:   HTN (hypertension), benign   Hypothyroidism    Plan: As above. Will also check iron studies    LOS: 2 days   Erminio Nygard L 07/24/2016, 8:31 AM

## 2016-07-24 NOTE — Progress Notes (Signed)
    Subjective: States abdomen is sore but "better". States " I don't know how to tell you how I'm doing, because I've got sticky syrup on my hands". No N/V. Few loose stools yesterday but mixed in urine per nursing Verlon Au(Leslie, RN). Unable to obtain stool specimen.   Objective: Vital signs in last 24 hours: Temp:  [97.8 F (36.6 C)-99.3 F (37.4 C)] 99.3 F (37.4 C) (01/02 0532) Pulse Rate:  [75-84] 84 (01/02 0532) Resp:  [18] 18 (01/02 0532) BP: (109-123)/(52-72) 123/52 (01/02 0532) SpO2:  [95 %-100 %] 95 % (01/02 0532) Last BM Date: 07/23/16 General:   Alert and oriented, pleasant Head:  Normocephalic and atraumatic. Eyes:  No icterus, sclera clear. Conjuctiva pink.  Abdomen:  Bowel sounds present, fullness noted in abdomen but soft. No TTP Neurologic:  Alert and  oriented to person; unknown year and city Psych:  Alert and cooperative.  Intake/Output from previous day: 01/01 0701 - 01/02 0700 In: 900 [P.O.:600; IV Piggyback:300] Out: -  Intake/Output this shift: No intake/output data recorded.  Lab Results:  Recent Labs  07/22/16 0614 07/23/16 0618 07/24/16 0723  WBC 11.8* 8.9 6.7  HGB 8.3* 7.6* 7.5*  HCT 27.7* 25.2* 25.2*  PLT 162 144* 134*   BMET  Recent Labs  07/22/16 0614 07/23/16 0618 07/24/16 0723  NA 138 139 139  K 4.6 3.6 3.6  CL 110 113* 111  CO2 21* 23 23  GLUCOSE 164* 111* 100*  BUN 41* 30* 20  CREATININE 1.56* 1.30* 1.08*  CALCIUM 8.0* 7.5* 7.3*   LFT  Recent Labs  07/21/16 1912 07/22/16 0614  PROT 7.2 5.0*  ALBUMIN 3.5 2.4*  AST 30 21  ALT 13* 10*  ALKPHOS 92 63  BILITOT 0.8 0.8    Assessment: 83 year old female admitted with abdominal pain, constipation, followed by loose stool while in the ED with CT findings of colitis, suspected to likely have ischemic colitis as culprit. Empiric coverage with Cipro and Flagyl, transitioned to oral today per attending.  No stool studies able to be completed as she has loose stool with urine  mixed. 1 loose stool overnight. No overt GI bleeding. Clinically improving, tolerating diet.   Anemia: chronic, with unknown baseline. Last year Hgb in 9 range. Admitting Hgb 10.2, continues to drift down this admission but without overt GI bleeding. Hemoccult ordered. Iron studies ordered by attending; will add ferritin. Last EGD in May 2015. Unknown if prior colonoscopy.   Plan: Follow H/H Iron studies ordered by attending, add ferritin Continue oral antibiotics and attempt stool collection if able to obtain without contamination Continue dysphagia 2 diet   Gelene MinkAnna W. Aminta Sakurai, ANP-BC [facility]    LOS: 2 days    07/24/2016, 8:06 AM

## 2016-07-25 ENCOUNTER — Telehealth: Payer: Self-pay | Admitting: Gastroenterology

## 2016-07-25 ENCOUNTER — Other Ambulatory Visit: Payer: Self-pay

## 2016-07-25 DIAGNOSIS — D649 Anemia, unspecified: Secondary | ICD-10-CM

## 2016-07-25 DIAGNOSIS — D500 Iron deficiency anemia secondary to blood loss (chronic): Secondary | ICD-10-CM

## 2016-07-25 LAB — GLUCOSE, CAPILLARY
Glucose-Capillary: 101 mg/dL — ABNORMAL HIGH (ref 65–99)
Glucose-Capillary: 95 mg/dL (ref 65–99)
Glucose-Capillary: 99 mg/dL (ref 65–99)
Glucose-Capillary: 99 mg/dL (ref 65–99)

## 2016-07-25 LAB — CBC WITH DIFFERENTIAL/PLATELET
BASOS ABS: 0 10*3/uL (ref 0.0–0.1)
Basophils Relative: 0 %
Eosinophils Absolute: 0.3 10*3/uL (ref 0.0–0.7)
Eosinophils Relative: 5 %
HCT: 25.9 % — ABNORMAL LOW (ref 36.0–46.0)
Hemoglobin: 7.8 g/dL — ABNORMAL LOW (ref 12.0–15.0)
LYMPHS PCT: 20 %
Lymphs Abs: 1.3 10*3/uL (ref 0.7–4.0)
MCH: 23 pg — AB (ref 26.0–34.0)
MCHC: 30.1 g/dL (ref 30.0–36.0)
MCV: 76.4 fL — AB (ref 78.0–100.0)
Monocytes Absolute: 0.6 10*3/uL (ref 0.1–1.0)
Monocytes Relative: 9 %
Neutro Abs: 4.1 10*3/uL (ref 1.7–7.7)
Neutrophils Relative %: 66 %
PLATELETS: 144 10*3/uL — AB (ref 150–400)
RBC: 3.39 MIL/uL — ABNORMAL LOW (ref 3.87–5.11)
RDW: 19.7 % — AB (ref 11.5–15.5)
WBC: 6.3 10*3/uL (ref 4.0–10.5)

## 2016-07-25 MED ORDER — CIPROFLOXACIN HCL 500 MG PO TABS: 500.0000 mg | ORAL_TABLET | Freq: Every day | ORAL | 0 refills | Status: DC

## 2016-07-25 MED ORDER — METRONIDAZOLE 500 MG PO TABS: 500.0000 mg | ORAL_TABLET | Freq: Three times a day (TID) | ORAL | 0 refills | Status: DC

## 2016-07-25 MED ORDER — FERROUS FUMARATE 324 (106 FE) MG PO TABS
1.0000 | ORAL_TABLET | Freq: Every day | ORAL | Status: DC
  Administered 2016-07-25: 106 mg via ORAL
  Filled 2016-07-25: qty 1

## 2016-07-25 MED ORDER — FERROUS FUMARATE 324 (106 FE) MG PO TABS
1.0000 | ORAL_TABLET | Freq: Every day | ORAL | 5 refills | Status: AC
Start: 2016-07-25 — End: ?

## 2016-07-25 NOTE — Care Management Note (Signed)
Case Management Note  Patient Details  Name: Elizabeth [name] MRN: [id number] Date of Birth: [birth year]     Expected Discharge Date:  07/22/16               Expected Discharge Plan:  Home w Home Health Services  In-House Referral:  NA  Discharge planning Services  CM Consult  Post Acute Care Choice:  Home Health Choice offered to:  Patient  DME Arranged:    DME Agency:     HH Arranged:  RN HH Agency:  [facility]  Status of Service:  Completed, signed off  If discussed at Long Length of Stay Meetings, dates discussed:    Additional Comments: Patient discharging home today with Home health services. Alroy BailiffLinda Lothian [facility] notified and will obtain orders from chart. Left message for Pam [facility].   Lydia Meng, Chrystine OilerSharley Diane, RN 07/25/2016, 1:12 PM

## 2016-07-25 NOTE — Discharge Summary (Signed)
Physician Discharge Summary  Patient ID: Elizabeth [name] MRN: [id number] DOB/AGE: Dec 06, 1918 83 y.o. Primary Care Physician:Sircharles Holzheimer L, MD Admit date: 07/21/2016 Discharge date: 07/25/2016    Discharge Diagnoses:   Principal Problem:   Colitis Active Problems:   HTN (hypertension), benign   Hypothyroidism   Allergies as of 07/25/2016   No Known Allergies     Medication List    TAKE these medications   amLODipine 5 MG tablet Commonly known as:  NORVASC Take 5 mg by mouth daily after breakfast.   aspirin 325 MG tablet Take 325 mg by mouth every morning.   atorvastatin 10 MG tablet Commonly known as:  LIPITOR Take 10 mg by mouth every morning.   ciprofloxacin 500 MG tablet Commonly known as:  CIPRO Take 1 tablet (500 mg total) by mouth daily.   Ferrous Fumarate 324 (106 Fe) MG Tabs tablet Commonly known as:  HEMOCYTE - 106 mg FE Take 1 tablet (106 mg of iron total) by mouth daily.   lansoprazole 30 MG capsule Commonly known as:  PREVACID Take 1 capsule (30 mg total) by mouth daily.   levothyroxine 112 MCG tablet Commonly known as:  SYNTHROID, LEVOTHROID Take 112 mcg by mouth daily before breakfast.   metroNIDAZOLE 500 MG tablet Commonly known as:  FLAGYL Take 1 tablet (500 mg total) by mouth every 8 (eight) hours.   quinapril 20 MG tablet Commonly known as:  ACCUPRIL Take 20 mg by mouth daily after breakfast.       Discharged Condition:Improved    Consults: GI  Significant Diagnostic Studies: Ct Abdomen Pelvis Wo Contrast  Result Date: 07/21/2016 CLINICAL DATA:  Generalized abdominal pain. EXAM: CT ABDOMEN AND PELVIS WITHOUT CONTRAST TECHNIQUE: Multidetector CT imaging of the abdomen and pelvis was performed following the standard protocol without IV contrast. COMPARISON:  CT scan of October 18, 2014. FINDINGS: Lower chest: Large hiatal hernia is noted. Visualized lung bases are unremarkable. Hepatobiliary: No focal liver abnormality is seen. No  gallstones, gallbladder wall thickening, or biliary dilatation. Pancreas: Unremarkable. No pancreatic ductal dilatation or surrounding inflammatory changes. Spleen: Normal in size without focal abnormality. Adrenals/Urinary Tract: Adrenal glands are unremarkable. Mild left renal atrophy is noted. No hydronephrosis or renal obstruction is noted. No renal or ureteral calculi are noted. Urinary bladder appears normal. Stomach/Bowel: There is no evidence of bowel obstruction. Inflammatory changes are seen involving the descending colon an sigmoid colon concerning for colitis. Vascular/Lymphatic: Aortic atherosclerosis. No enlarged abdominal or pelvic lymph nodes. Reproductive: Status post hysterectomy. No adnexal masses. Other: No abdominal wall hernia or abnormality. No abdominopelvic ascites. Musculoskeletal: Severe multilevel degenerative disc disease is noted in the lumbar spine. IMPRESSION: Large hiatal hernia. Mild left renal atrophy. Aortic atherosclerosis. Inflammatory changes are seen surrounding the descending and sigmoid colon concerning for infectious or inflammatory colitis. Electronically Signed   By: Lupita Raider, M.D.   On: 07/21/2016 21:15    Lab Results: Basic Metabolic Panel:  Recent Labs  78/29/56 0618 07/24/16 0723  NA 139 139  K 3.6 3.6  CL 113* 111  CO2 23 23  GLUCOSE 111* 100*  BUN 30* 20  CREATININE 1.30* 1.08*  CALCIUM 7.5* 7.3*   Liver Function Tests: No results for input(s): AST, ALT, ALKPHOS, BILITOT, PROT, ALBUMIN in the last 72 hours.   CBC:  Recent Labs  07/24/16 0723 07/25/16 0440  WBC 6.7 6.3  NEUTROABS 5.1 4.1  HGB 7.5* 7.8*  HCT 25.2* 25.9*  MCV 76.8* 76.4*  PLT 134* 144*  No results found for this or any previous visit (from the past 240 hour(s)).   Hospital Course: This is a 83 year old who came to the emergency department because of abdominal pain. CT scan showed what looked like colitis. There was some concern that she might have had  ischemic colitis versus infectious. She was treated for infectious colitis and observed for the ischemic colitis. She was noted to be anemic which is felt to be due to chronic blood loss. Iron studies show iron deficiency anemia. She had GI consultation. By the time of discharge she was much improved able to eat solid food with no complaints of abdominal pain  Discharge Exam: Blood pressure 132/75, pulse 74, temperature 99 F (37.2 C), resp. rate 15, height 5\' 6"  (1.676 m), weight 57.1 kg (125 lb 14.1 oz), SpO2 96 %. She is awake and alert. Very hard of hearing. Minimally tender in the abdomen. Chest is clear.  Disposition: Home with home health services. She will be discharged home on the third if she doesn't need any inpatient evaluation of her anemia later if she needs endoscopy/colonoscopy      Signed: Nakai Pollio L   07/25/2016, 9:11 AM

## 2016-07-25 NOTE — Progress Notes (Signed)
Subjective:  Patient tolerating diet. No complaints.   Objective: Vital signs in last 24 hours: Temp:  [98 F (36.7 C)-99 F (37.2 C)] 99 F (37.2 C) (01/03 0551) Pulse Rate:  [74-75] 74 (01/03 0551) Resp:  [15-20] 15 (01/03 0551) BP: (132-139)/(75-84) 132/75 (01/03 0551) SpO2:  [96 %-98 %] 96 % (01/03 0551) Last BM Date: 07/23/16 General:   Alert,  Well-developed, well-nourished, pleasant and cooperative in NAD Head:  Normocephalic and atraumatic. Eyes:  Sclera clear, no icterus.   Abdomen:  Soft, nontender and nondistended. Normal bowel sounds, without guarding, and without rebound.   Extremities:  Without clubbing, deformity or edema. Neurologic:  Alert and  oriented x4;  grossly normal neurologically. Skin:  Intact without significant lesions or rashes. Psych:  Alert and cooperative. Normal mood and affect.  Intake/Output from previous day: 01/02 0701 - 01/03 0700 In: 480 [P.O.:480] Out: -  Intake/Output this shift: No intake/output data recorded.  Lab Results: CBC  Recent Labs  07/23/16 0618 07/24/16 0723 07/25/16 0440  WBC 8.9 6.7 6.3  HGB 7.6* 7.5* 7.8*  HCT 25.2* 25.2* 25.9*  MCV 76.8* 76.8* 76.4*  PLT 144* 134* 144*   BMET  Recent Labs  07/23/16 0618 07/24/16 0723  NA 139 139  K 3.6 3.6  CL 113* 111  CO2 23 23  GLUCOSE 111* 100*  BUN 30* 20  CREATININE 1.30* 1.08*  CALCIUM 7.5* 7.3*   LFTs No results for input(s): BILITOT, BILIDIR, IBILI, ALKPHOS, AST, ALT, PROT, ALBUMIN in the last 72 hours. No results for input(s): LIPASE in the last 72 hours. PT/INR No results for input(s): LABPROT, INR in the last 72 hours.    Imaging Studies: Ct Abdomen Pelvis Wo Contrast  Result Date: 07/21/2016 CLINICAL DATA:  Generalized abdominal pain. EXAM: CT ABDOMEN AND PELVIS WITHOUT CONTRAST TECHNIQUE: Multidetector CT imaging of the abdomen and pelvis was performed following the standard protocol without IV contrast. COMPARISON:  CT scan of October 18, 2014.  FINDINGS: Lower chest: Large hiatal hernia is noted. Visualized lung bases are unremarkable. Hepatobiliary: No focal liver abnormality is seen. No gallstones, gallbladder wall thickening, or biliary dilatation. Pancreas: Unremarkable. No pancreatic ductal dilatation or surrounding inflammatory changes. Spleen: Normal in size without focal abnormality. Adrenals/Urinary Tract: Adrenal glands are unremarkable. Mild left renal atrophy is noted. No hydronephrosis or renal obstruction is noted. No renal or ureteral calculi are noted. Urinary bladder appears normal. Stomach/Bowel: There is no evidence of bowel obstruction. Inflammatory changes are seen involving the descending colon an sigmoid colon concerning for colitis. Vascular/Lymphatic: Aortic atherosclerosis. No enlarged abdominal or pelvic lymph nodes. Reproductive: Status post hysterectomy. No adnexal masses. Other: No abdominal wall hernia or abnormality. No abdominopelvic ascites. Musculoskeletal: Severe multilevel degenerative disc disease is noted in the lumbar spine. IMPRESSION: Large hiatal hernia. Mild left renal atrophy. Aortic atherosclerosis. Inflammatory changes are seen surrounding the descending and sigmoid colon concerning for infectious or inflammatory colitis. Electronically Signed   By: Lupita RaiderJames  Green Jr, M.D.   On: 07/21/2016 21:15  [2 weeks]   Assessment: 83 year old female admitted with abdominal pain, constipation, followed by loose stool while in the ED with CT findings of colitis, suspected to likely have ischemic colitis as culprit. Empiric coverage with Cipro and Flagyl, transitioned to oral per attending.  No stool studies able to be completed as she has loose stool with urine mixed. No overt GI bleeding. Clinically improving, tolerating diet.   Anemia: chronic, with unknown baseline. Last year Hgb in 9 range. Admitting Hgb  10.2, continues to drift down this admission but without overt GI bleeding. Iron/sat low, normal TIBC and  ferritin. Likely element of anemia of chronic disease +/- IDA with microcytosis. Hemoccult ordered. Last EGD in May 2015. Unknown if prior colonoscopy.   Plan: 1. Complete course of abx as planned.  2. OK for discharge from GI standpoint.  3. We will plan for close interval follow up in the office. If evidence of decline in Hgb, overt GI bleeding, would consider offering colonoscopy.   Leanna Battles. [facility] [phone] 1/3/20189:45 AM     LOS: 3 days

## 2016-07-25 NOTE — Progress Notes (Signed)
Subjective: She says she feels better. She has done well with oral antibiotics. She was able to eat yesterday. Her abdominal pain is better. Her iron studies show iron deficiency anemia.  Objective: Vital signs in last 24 hours: Temp:  [98 F (36.7 C)-99 F (37.2 C)] 99 F (37.2 C) (01/03 0551) Pulse Rate:  [74-75] 74 (01/03 0551) Resp:  [15-20] 15 (01/03 0551) BP: (132-139)/(75-84) 132/75 (01/03 0551) SpO2:  [96 %-98 %] 96 % (01/03 0551) Weight change:  Last BM Date: 07/23/16  Intake/Output from previous day: 01/02 0701 - 01/03 0700 In: 480 [P.O.:480] Out: -   PHYSICAL EXAM General appearance: alert, cooperative, no distress and Very hard of hearing Resp: clear to auscultation bilaterally Cardio: regular rate and rhythm, S1, S2 normal, no murmur, click, rub or gallop GI: She is mildly tender in the lower quadrant but better Extremities: extremities normal, atraumatic, no cyanosis or edema Skin warm and dry. Mucous membranes are moist  Lab Results:  Results for orders placed or performed during the hospital encounter of 07/21/16 (from the past 48 hour(s))  Glucose, capillary     Status: None   Collection Time: 07/23/16 12:07 PM  Result Value Ref Range   Glucose-Capillary 99 65 - 99 mg/dL  Glucose, capillary     Status: None   Collection Time: 07/24/16 12:08 AM  Result Value Ref Range   Glucose-Capillary 94 65 - 99 mg/dL   Comment 1 Notify RN    Comment 2 Document in Chart   Glucose, capillary     Status: None   Collection Time: 07/24/16  6:45 AM  Result Value Ref Range   Glucose-Capillary 93 65 - 99 mg/dL   Comment 1 Notify RN    Comment 2 Document in Chart   CBC with Differential/Platelet     Status: Abnormal   Collection Time: 07/24/16  7:23 AM  Result Value Ref Range   WBC 6.7 4.0 - 10.5 K/uL   RBC 3.28 (L) 3.87 - 5.11 MIL/uL   Hemoglobin 7.5 (L) 12.0 - 15.0 g/dL   HCT 25.2 (L) 36.0 - 46.0 %   MCV 76.8 (L) 78.0 - 100.0 fL   MCH 22.9 (L) 26.0 - 34.0 pg   MCHC 29.8 (L) 30.0 - 36.0 g/dL   RDW 19.8 (H) 11.5 - 15.5 %   Platelets 134 (L) 150 - 400 K/uL   Neutrophils Relative % 77 %   Neutro Abs 5.1 1.7 - 7.7 K/uL   Lymphocytes Relative 15 %   Lymphs Abs 1.0 0.7 - 4.0 K/uL   Monocytes Relative 6 %   Monocytes Absolute 0.4 0.1 - 1.0 K/uL   Eosinophils Relative 2 %   Eosinophils Absolute 0.2 0.0 - 0.7 K/uL   Basophils Relative 0 %   Basophils Absolute 0.0 0.0 - 0.1 K/uL  Basic metabolic panel     Status: Abnormal   Collection Time: 07/24/16  7:23 AM  Result Value Ref Range   Sodium 139 135 - 145 mmol/L   Potassium 3.6 3.5 - 5.1 mmol/L   Chloride 111 101 - 111 mmol/L   CO2 23 22 - 32 mmol/L   Glucose, Bld 100 (H) 65 - 99 mg/dL   BUN 20 6 - 20 mg/dL   Creatinine, Ser 1.08 (H) 0.44 - 1.00 mg/dL   Calcium 7.3 (L) 8.9 - 10.3 mg/dL   GFR calc non Af Amer 42 (L) >60 mL/min   GFR calc Af Amer 49 (L) >60 mL/min    Comment: (NOTE)  The eGFR has been calculated using the CKD EPI equation. This calculation has not been validated in all clinical situations. eGFR's persistently <60 mL/min signify possible Chronic Kidney Disease.    Anion gap 5 5 - 15  Iron and TIBC     Status: Abnormal   Collection Time: 07/24/16 10:07 AM  Result Value Ref Range   Iron 8 (L) 28 - 170 ug/dL   TIBC 253 250 - 450 ug/dL   Saturation Ratios 3 (L) 10.4 - 31.8 %   UIBC 245 ug/dL    Comment: Performed at [facility]  Ferritin     Status: None   Collection Time: 07/24/16 10:07 AM  Result Value Ref Range   Ferritin 32 11 - 307 ng/mL    Comment: Performed at [facility]  Glucose, capillary     Status: Abnormal   Collection Time: 07/24/16 11:43 AM  Result Value Ref Range   Glucose-Capillary 157 (H) 65 - 99 mg/dL   Comment 1 Notify RN    Comment 2 Document in Chart   Glucose, capillary     Status: Abnormal   Collection Time: 07/24/16  5:04 PM  Result Value Ref Range   Glucose-Capillary 105 (H) 65 - 99 mg/dL   Comment 1 Notify RN    Comment 2  Document in Chart   Glucose, capillary     Status: None   Collection Time: 07/25/16 12:13 AM  Result Value Ref Range   Glucose-Capillary 99 65 - 99 mg/dL   Comment 1 Notify RN    Comment 2 Document in Chart   CBC with Differential/Platelet     Status: Abnormal   Collection Time: 07/25/16  4:40 AM  Result Value Ref Range   WBC 6.3 4.0 - 10.5 K/uL   RBC 3.39 (L) 3.87 - 5.11 MIL/uL   Hemoglobin 7.8 (L) 12.0 - 15.0 g/dL   HCT 25.9 (L) 36.0 - 46.0 %   MCV 76.4 (L) 78.0 - 100.0 fL   MCH 23.0 (L) 26.0 - 34.0 pg   MCHC 30.1 30.0 - 36.0 g/dL   RDW 19.7 (H) 11.5 - 15.5 %   Platelets 144 (L) 150 - 400 K/uL   Neutrophils Relative % 66 %   Neutro Abs 4.1 1.7 - 7.7 K/uL   Lymphocytes Relative 20 %   Lymphs Abs 1.3 0.7 - 4.0 K/uL   Monocytes Relative 9 %   Monocytes Absolute 0.6 0.1 - 1.0 K/uL   Eosinophils Relative 5 %   Eosinophils Absolute 0.3 0.0 - 0.7 K/uL   Basophils Relative 0 %   Basophils Absolute 0.0 0.0 - 0.1 K/uL  Glucose, capillary     Status: None   Collection Time: 07/25/16  5:49 AM  Result Value Ref Range   Glucose-Capillary 95 65 - 99 mg/dL  Glucose, capillary     Status: Abnormal   Collection Time: 07/25/16  7:57 AM  Result Value Ref Range   Glucose-Capillary 101 (H) 65 - 99 mg/dL    ABGS No results for input(s): PHART, PO2ART, TCO2, HCO3 in the last 72 hours.  Invalid input(s): PCO2 CULTURES No results found for this or any previous visit (from the past 240 hour(s)). Studies/Results: No results found.  Medications:  Prior to Admission:  Prescriptions Prior to Admission  Medication Sig Dispense Refill Last Dose  . amLODipine (NORVASC) 5 MG tablet Take 5 mg by mouth daily after breakfast.    07/21/2016 at Unknown time  . aspirin 325 MG tablet Take  325 mg by mouth every morning.    07/21/2016 at Unknown time  . atorvastatin (LIPITOR) 10 MG tablet Take 10 mg by mouth every morning.    07/21/2016 at Unknown time  . lansoprazole (PREVACID) 30 MG capsule Take 1  capsule (30 mg total) by mouth daily. 30 capsule 11 07/21/2016 at Unknown time  . levothyroxine (SYNTHROID, LEVOTHROID) 112 MCG tablet Take 112 mcg by mouth daily before breakfast.   07/21/2016 at Unknown time  . quinapril (ACCUPRIL) 20 MG tablet Take 20 mg by mouth daily after breakfast.    07/21/2016 at Unknown time   Scheduled: . ciprofloxacin  500 mg Oral Daily  . levothyroxine  66 mcg Intravenous QAC breakfast  . metroNIDAZOLE  500 mg Oral Q8H   Continuous:  FMB:[id number] **OR** acetaminophen, hydrALAZINE, ondansetron **OR** ondansetron (ZOFRAN) IV  Assesment: She was admitted with colitis. She is better. She is on antibiotics by mouth now. She is tolerating them well. She has iron deficiency anemia. If she doesn't need inpatient procedures I probably will send her home today with home health services. If she does need inpatient procedures will allow her to go home after she's had those completed Principal Problem:   Colitis Active Problems:   HTN (hypertension), benign   Hypothyroidism    Plan: As above. Discuss with GI. Start iron    LOS: 3 days   Lesieli Bresee L 07/25/2016, 8:36 AM

## 2016-07-25 NOTE — Progress Notes (Signed)
Pt had 9 beat run of Vtach.  Pt is currently asleep.  Dr. Juanetta GoslingHawkins made aware during rounds.  No new orders at this time.  Will continue to monitor.

## 2016-07-25 NOTE — Progress Notes (Signed)
Pt discharged home today per Dr. Hawkins. Pt's IV site D/C'd and WDL. Pt's VSS. Pt provided with home medication list, discharge instructions and prescriptions. Verbalized understanding. Pt left floor via WC in stable condition accompanied by NT. 

## 2016-07-25 NOTE — Telephone Encounter (Signed)
Lab order done and mailed to the pt.  

## 2016-07-25 NOTE — Telephone Encounter (Signed)
appt made and nurse on 300 aware and will tell patient

## 2016-07-25 NOTE — Addendum Note (Signed)
Addended by: Myra RudeLAWSON, Josefa Syracuse H on: 07/25/2016 03:52 PM   Modules accepted: Orders

## 2016-07-25 NOTE — Telephone Encounter (Addendum)
Patient needs OV with anna, myself, or RMR in 2 weeks in the office for hospital follow up for anemia, colitis, determine if colonoscopy +/-EGD needed.   Needs CBC prior to OV.

## 2016-07-27 DIAGNOSIS — Z7982 Long term (current) use of aspirin: Secondary | ICD-10-CM | POA: Diagnosis not present

## 2016-07-27 DIAGNOSIS — K225 Diverticulum of esophagus, acquired: Secondary | ICD-10-CM | POA: Diagnosis not present

## 2016-07-27 DIAGNOSIS — D649 Anemia, unspecified: Secondary | ICD-10-CM | POA: Diagnosis not present

## 2016-07-27 DIAGNOSIS — E785 Hyperlipidemia, unspecified: Secondary | ICD-10-CM | POA: Diagnosis not present

## 2016-07-27 DIAGNOSIS — I129 Hypertensive chronic kidney disease with stage 1 through stage 4 chronic kidney disease, or unspecified chronic kidney disease: Secondary | ICD-10-CM | POA: Diagnosis not present

## 2016-07-27 DIAGNOSIS — N183 Chronic kidney disease, stage 3 (moderate): Secondary | ICD-10-CM | POA: Diagnosis not present

## 2016-07-27 DIAGNOSIS — E039 Hypothyroidism, unspecified: Secondary | ICD-10-CM | POA: Diagnosis not present

## 2016-07-27 DIAGNOSIS — M810 Age-related osteoporosis without current pathological fracture: Secondary | ICD-10-CM | POA: Diagnosis not present

## 2016-07-27 DIAGNOSIS — K529 Noninfective gastroenteritis and colitis, unspecified: Secondary | ICD-10-CM | POA: Diagnosis not present

## 2016-07-27 DIAGNOSIS — I2510 Atherosclerotic heart disease of native coronary artery without angina pectoris: Secondary | ICD-10-CM | POA: Diagnosis not present

## 2016-07-30 ENCOUNTER — Inpatient Hospital Stay (HOSPITAL_COMMUNITY)
Admission: EM | Admit: 2016-07-30 | Discharge: 2016-08-02 | DRG: 641 | Disposition: A | Discharge status: Skilled Nursing Facility | Payer: Medicare Other | Attending: Pulmonary Disease | Admitting: Pulmonary Disease

## 2016-07-30 ENCOUNTER — Encounter (HOSPITAL_COMMUNITY): Payer: Self-pay | Admitting: Emergency Medicine

## 2016-07-30 DIAGNOSIS — R296 Repeated falls: Secondary | ICD-10-CM | POA: Diagnosis not present

## 2016-07-30 DIAGNOSIS — Z7982 Long term (current) use of aspirin: Secondary | ICD-10-CM

## 2016-07-30 DIAGNOSIS — R262 Difficulty in walking, not elsewhere classified: Secondary | ICD-10-CM

## 2016-07-30 DIAGNOSIS — M810 Age-related osteoporosis without current pathological fracture: Secondary | ICD-10-CM | POA: Diagnosis present

## 2016-07-30 DIAGNOSIS — K219 Gastro-esophageal reflux disease without esophagitis: Secondary | ICD-10-CM | POA: Diagnosis present

## 2016-07-30 DIAGNOSIS — I2510 Atherosclerotic heart disease of native coronary artery without angina pectoris: Secondary | ICD-10-CM | POA: Diagnosis not present

## 2016-07-30 DIAGNOSIS — D500 Iron deficiency anemia secondary to blood loss (chronic): Secondary | ICD-10-CM | POA: Diagnosis present

## 2016-07-30 DIAGNOSIS — R531 Weakness: Secondary | ICD-10-CM

## 2016-07-30 DIAGNOSIS — Z79899 Other long term (current) drug therapy: Secondary | ICD-10-CM

## 2016-07-30 DIAGNOSIS — E860 Dehydration: Secondary | ICD-10-CM | POA: Diagnosis not present

## 2016-07-30 DIAGNOSIS — R627 Adult failure to thrive: Secondary | ICD-10-CM | POA: Diagnosis present

## 2016-07-30 DIAGNOSIS — E785 Hyperlipidemia, unspecified: Secondary | ICD-10-CM | POA: Diagnosis present

## 2016-07-30 DIAGNOSIS — I10 Essential (primary) hypertension: Secondary | ICD-10-CM | POA: Diagnosis present

## 2016-07-30 DIAGNOSIS — F0390 Unspecified dementia without behavioral disturbance: Secondary | ICD-10-CM | POA: Diagnosis not present

## 2016-07-30 DIAGNOSIS — R5381 Other malaise: Secondary | ICD-10-CM | POA: Diagnosis present

## 2016-07-30 DIAGNOSIS — E039 Hypothyroidism, unspecified: Secondary | ICD-10-CM | POA: Diagnosis present

## 2016-07-30 DIAGNOSIS — Z951 Presence of aortocoronary bypass graft: Secondary | ICD-10-CM

## 2016-07-30 DIAGNOSIS — E876 Hypokalemia: Secondary | ICD-10-CM | POA: Diagnosis not present

## 2016-07-30 DIAGNOSIS — H9190 Unspecified hearing loss, unspecified ear: Secondary | ICD-10-CM | POA: Diagnosis present

## 2016-07-30 DIAGNOSIS — R270 Ataxia, unspecified: Secondary | ICD-10-CM

## 2016-07-30 LAB — URINALYSIS, ROUTINE W REFLEX MICROSCOPIC
Bacteria, UA: NONE SEEN
Bilirubin Urine: NEGATIVE
Glucose, UA: NEGATIVE mg/dL
Hgb urine dipstick: NEGATIVE
Ketones, ur: NEGATIVE mg/dL
Nitrite: NEGATIVE
Protein, ur: NEGATIVE mg/dL
Specific Gravity, Urine: 1.015 (ref 1.005–1.030)
pH: 5 (ref 5.0–8.0)

## 2016-07-30 LAB — COMPREHENSIVE METABOLIC PANEL
ALT: 14 U/L (ref 14–54)
AST: 29 U/L (ref 15–41)
Albumin: 2.5 g/dL — ABNORMAL LOW (ref 3.5–5.0)
Alkaline Phosphatase: 39 U/L (ref 38–126)
Anion gap: 9 (ref 5–15)
BUN: 15 mg/dL (ref 6–20)
CO2: 23 mmol/L (ref 22–32)
Calcium: 7.9 mg/dL — ABNORMAL LOW (ref 8.9–10.3)
Chloride: 105 mmol/L (ref 101–111)
Creatinine, Ser: 1.14 mg/dL — ABNORMAL HIGH (ref 0.44–1.00)
GFR calc Af Amer: 46 mL/min — ABNORMAL LOW (ref >60)
GFR calc non Af Amer: 40 mL/min — ABNORMAL LOW (ref >60)
Glucose, Bld: 155 mg/dL — ABNORMAL HIGH (ref 65–99)
Potassium: 2.8 mmol/L — ABNORMAL LOW (ref 3.5–5.1)
Sodium: 137 mmol/L (ref 135–145)
Total Bilirubin: 0.3 mg/dL (ref 0.3–1.2)
Total Protein: 5.5 g/dL — ABNORMAL LOW (ref 6.5–8.1)

## 2016-07-30 LAB — TSH: TSH: 13.153 u[IU]/mL — AB (ref 0.350–4.500)

## 2016-07-30 LAB — CBC
HCT: 26.9 % — ABNORMAL LOW (ref 36.0–46.0)
Hemoglobin: 8.3 g/dL — ABNORMAL LOW (ref 12.0–15.0)
MCH: 23.2 pg — ABNORMAL LOW (ref 26.0–34.0)
MCHC: 30.9 g/dL (ref 30.0–36.0)
MCV: 75.4 fL — ABNORMAL LOW (ref 78.0–100.0)
Platelets: 159 10*3/uL (ref 150–400)
RBC: 3.57 MIL/uL — ABNORMAL LOW (ref 3.87–5.11)
RDW: 21 % — ABNORMAL HIGH (ref 11.5–15.5)
WBC: 5.1 10*3/uL (ref 4.0–10.5)

## 2016-07-30 LAB — I-STAT CHEM 8, ED
BUN: 12 mg/dL (ref 6–20)
CALCIUM ION: 1.04 mmol/L — AB (ref 1.15–1.40)
Chloride: 105 mmol/L (ref 101–111)
Creatinine, Ser: 1 mg/dL (ref 0.44–1.00)
GLUCOSE: 92 mg/dL (ref 65–99)
HCT: 24 % — ABNORMAL LOW (ref 36.0–46.0)
HEMOGLOBIN: 8.2 g/dL — AB (ref 12.0–15.0)
Potassium: 3.1 mmol/L — ABNORMAL LOW (ref 3.5–5.1)
Sodium: 140 mmol/L (ref 135–145)
TCO2: 24 mmol/L (ref 0–100)

## 2016-07-30 LAB — TYPE AND SCREEN
ABO/RH(D): O POS
Antibody Screen: NEGATIVE

## 2016-07-30 MED ORDER — LEVOTHYROXINE SODIUM 50 MCG PO TABS: 50.0000 ug | ORAL_TABLET | Freq: Every day | ORAL | Status: DC

## 2016-07-30 MED ORDER — ACETAMINOPHEN 325 MG PO TABS: 650.0000 mg | ORAL_TABLET | Freq: Four times a day (QID) | ORAL | Status: DC | PRN

## 2016-07-30 MED ORDER — POTASSIUM CHLORIDE CRYS ER 20 MEQ PO TBCR
60.0000 meq | EXTENDED_RELEASE_TABLET | Freq: Once | ORAL | Status: AC
  Administered 2016-07-30: 60 meq via ORAL
  Filled 2016-07-30: qty 3

## 2016-07-30 MED ORDER — ASPIRIN 325 MG PO TABS
325.0000 mg | ORAL_TABLET | Freq: Every morning | ORAL | Status: DC
  Administered 2016-07-31 – 2016-08-02 (×3): 325 mg via ORAL
  Filled 2016-07-30 (×3): qty 1

## 2016-07-30 MED ORDER — AMLODIPINE BESYLATE 5 MG PO TABS
5.0000 mg | ORAL_TABLET | Freq: Every day | ORAL | Status: DC
  Filled 2016-07-30 (×2): qty 1

## 2016-07-30 MED ORDER — ONDANSETRON HCL 4 MG/2ML IJ SOLN: 4.0000 mg | Freq: Four times a day (QID) | INTRAMUSCULAR | Status: DC | PRN

## 2016-07-30 MED ORDER — POTASSIUM CHLORIDE IN NACL 20-0.9 MEQ/L-% IV SOLN
INTRAVENOUS | Status: AC
  Administered 2016-07-30: 20:00:00 via INTRAVENOUS

## 2016-07-30 MED ORDER — FERROUS FUMARATE 324 (106 FE) MG PO TABS
1.0000 | ORAL_TABLET | Freq: Every day | ORAL | Status: DC
  Administered 2016-07-31 – 2016-08-02 (×3): 106 mg via ORAL
  Filled 2016-07-30 (×3): qty 1

## 2016-07-30 MED ORDER — POLYETHYLENE GLYCOL 3350 17 G PO PACK: 17.0000 g | PACK | Freq: Every day | ORAL | Status: DC | PRN

## 2016-07-30 MED ORDER — BISACODYL 5 MG PO TBEC: 5.0000 mg | DELAYED_RELEASE_TABLET | Freq: Every day | ORAL | Status: DC | PRN

## 2016-07-30 MED ORDER — LISINOPRIL 10 MG PO TABS
20.0000 mg | ORAL_TABLET | Freq: Every day | ORAL | Status: DC
  Filled 2016-07-30 (×2): qty 2

## 2016-07-30 MED ORDER — LEVOTHYROXINE SODIUM 75 MCG PO TABS
75.0000 ug | ORAL_TABLET | Freq: Every day | ORAL | Status: DC
  Administered 2016-07-31 – 2016-08-02 (×3): 75 ug via ORAL
  Filled 2016-07-30: qty 2
  Filled 2016-07-30: qty 1
  Filled 2016-07-30: qty 2

## 2016-07-30 MED ORDER — ONDANSETRON HCL 4 MG PO TABS: 4.0000 mg | ORAL_TABLET | Freq: Four times a day (QID) | ORAL | Status: DC | PRN

## 2016-07-30 MED ORDER — ENOXAPARIN SODIUM 30 MG/0.3ML ~~LOC~~ SOLN
30.0000 mg | SUBCUTANEOUS | Status: DC
  Administered 2016-07-30 – 2016-08-02 (×3): 30 mg via SUBCUTANEOUS
  Filled 2016-07-30 (×3): qty 0.3

## 2016-07-30 MED ORDER — ATORVASTATIN CALCIUM 10 MG PO TABS
10.0000 mg | ORAL_TABLET | Freq: Every day | ORAL | Status: DC
  Administered 2016-07-30 – 2016-08-02 (×3): 10 mg via ORAL
  Filled 2016-07-30 (×3): qty 1

## 2016-07-30 MED ORDER — MAGNESIUM SULFATE 2 GM/50ML IV SOLN
2.0000 g | Freq: Once | INTRAVENOUS | Status: AC
  Administered 2016-07-30: 2 g via INTRAVENOUS
  Filled 2016-07-30: qty 50

## 2016-07-30 MED ORDER — QUINAPRIL HCL 10 MG PO TABS: 20.0000 mg | ORAL_TABLET | Freq: Every day | ORAL | Status: DC

## 2016-07-30 MED ORDER — ACETAMINOPHEN 650 MG RE SUPP: 650.0000 mg | Freq: Four times a day (QID) | RECTAL | Status: DC | PRN

## 2016-07-30 MED ORDER — HYDROCODONE-ACETAMINOPHEN 5-325 MG PO TABS: 1.0000 | ORAL_TABLET | ORAL | Status: DC | PRN

## 2016-07-30 NOTE — H&P (Signed)
History and Physical    AIANA NORDQUIST WNU:[id number] DOB: Jun 04, 1919 DOA: 07/30/2016  PCP: Fredirick Maudlin, MD   Patient coming from: Home  Chief Complaint: Generalized weakness, falls  HPI: Elizabeth [name] is a 83 y.o. female with medical history significant for hypertension, hypothyroidism, and chronic anemia who presents to the emergency department for evaluation of generalized weakness and had increasingly frequent falls. Patient had been admitted to the hospital at the end of December and was discharged on 07/25/2016 after treatment for colitis. Her abdominal complaints had resolved and she seemed to have returned to her usual state, but has been experiencing generalized weakness back at home and has unfortunately been suffering recurrent falls. Family has had difficulty caring for her as she now requires maximum assistance for ambulation and has been eating increased help with basic ADLs. History is limited by the patient's underlying dementia and is obtained through discussion with the ED personnel and review of the EMR.  ED Course: Upon arrival to the ED, patient is found to be afebrile, saturating adequately on room air, and with vital signs stable. EKG features a sinus rhythm with APCs and nonspecific interventricular conduction delay. Chemistry panels notable for a serum sodium of 2.8, albumin of and 2.5. CBC is notable for stable microcytic anemia with hemoglobin of 8.3 and MCV of 75.4. Urinalysis is unremarkable. Patient was treated with 60 mEq oral potassium in the emergency department. She is remained hemodynamically stable and in no apparent respiratory distress. She will be observed in the hospital for ongoing evaluation and management of generalized weakness and increasingly frequent falls, likely due to deconditioning, but with electrolyte abnormalities and underlying thyroid disease which may be contributing.  Review of Systems:  Unable to obtain complete ROS secondary to the  patient's clinical condition with dementia.  Past Medical History:  Diagnosis Date  . B12 deficiency   . C. difficile colitis Nov 2014   Treated with Flagyl  . Coronary artery disease   . GERD (gastroesophageal reflux disease)   . Hypercholesterolemia   . Hyperlipidemia   . Hypertension   . Hypothyroidism   . Osteoporosis   . Thyroid goiter    s/p surgery now on Synthroid  . Zenker diverticulum     Past Surgical History:  Procedure Laterality Date  . ABDOMINAL HYSTERECTOMY    . APPENDECTOMY    . BUNIONECTOMY    . CORONARY ARTERY BYPASS GRAFT     Status Post four-vessel   . ESOPHAGEAL DILATION    . ESOPHAGOGASTRODUODENOSCOPY  6/10    Schatzki's ring, otherwise endoscopically normal esophagus, status post dilation (53F), with mucosal disruption through the cervical segment as well as the Schatzki's ring. Hiatal hernia. segment as well as the Schatzki's ring. Hiatal hernia.  Marland Kitchen ESOPHAGOGASTRODUODENOSCOPY  09/02/2007   Cervical esophageal web, Schatzki's ring, status post  dilation and disruption as described above. Otherwise, normal esophagus  . ESOPHAGOGASTRODUODENOSCOPY  August 2011   Zenker's diverticulum, Schatzki's ring s/p dilation, mod to large HH, instructions to pursue BPE if recurrent dysphagia to assess Zenker's  . ESOPHAGOGASTRODUODENOSCOPY  03/12/12   Rourk-dilated 27F, cervical web, Schatzki's ring, small HH, small Zenker's diverticulum  . ESOPHAGOGASTRODUODENOSCOPY N/A 11/26/2013   Dr. Jena Gauss: Zenker's diverticulum, cervical esophageal web and Schatzi's ring s/p dilation, large hiatal hernia, consider ENT referral if persistent dysphagia.   Marland Kitchen MALONEY DILATION N/A 11/26/2013   Procedure: Elease Hashimoto DILATION;  Surgeon: Corbin Ade, MD;  Location: [facility];  Service: Endoscopy;  Laterality: N/A;  reports that she has never smoked. She has never used smokeless tobacco. She reports that she does not drink alcohol or use drugs.  No Known Allergies  Family History    Problem Relation Age of Onset  . Colon cancer Neg Hx      Prior to Admission medications   Medication Sig Start Date End Date Taking? Authorizing Provider  amLODipine (NORVASC) 5 MG tablet Take 5 mg by mouth daily after breakfast.  12/10/11  Yes Historical Provider, MD  aspirin 325 MG tablet Take 325 mg by mouth every morning.    Yes Historical Provider, MD  atorvastatin (LIPITOR) 10 MG tablet Take 10 mg by mouth every morning.  12/10/11  Yes Historical Provider, MD  ciprofloxacin (CIPRO) 500 MG tablet Take 1 tablet (500 mg total) by mouth daily. 07/25/16  Yes [name], MD  Ferrous Fumarate (HEMOCYTE - 106 MG FE) 324 (106 Fe) MG TABS tablet Take 1 tablet (106 mg of iron total) by mouth daily. 07/25/16  Yes [name], MD  levothyroxine (SYNTHROID, LEVOTHROID) 50 MCG tablet Take 50 mcg by mouth daily. 06/28/16  Yes Historical Provider, MD  metroNIDAZOLE (FLAGYL) 500 MG tablet Take 1 tablet (500 mg total) by mouth every 8 (eight) hours. 07/25/16  Yes [name], MD  quinapril (ACCUPRIL) 20 MG tablet Take 20 mg by mouth daily after breakfast.  12/10/11  Yes Historical Provider, MD  lansoprazole (PREVACID) 30 MG capsule Take 1 capsule (30 mg total) by mouth daily. Patient not taking: Reported on 07/30/2016 11/26/13   Corbin Ade, MD    Physical Exam: Vitals:   07/30/16 1530 07/30/16 1545 07/30/16 1600 07/30/16 1630  BP: 146/79  95/82 157/71  Pulse: 76 77 77 78  Resp: 19 18 21 20   Temp:      TempSrc:      SpO2: 98% 96% 95% 95%  Weight:      Height:          Constitutional: NAD, calm, comfortable Eyes: PERTLA, lids and conjunctivae normal ENMT: Mucous membranes are moist. Posterior pharynx clear of any exudate or lesions.   Neck: normal, supple, no masses, no thyromegaly Respiratory: clear to auscultation bilaterally, no wheezing, no crackles. Normal respiratory effort.   Cardiovascular: S1 & S2 heard, regular rate and rhythm, soft systolic murmur at upper sternal border. No  significant JVD. Abdomen: No distension, no tenderness, no masses palpated. Bowel sounds normal.  Musculoskeletal: no clubbing / cyanosis. No joint deformity upper and lower extremities.   Skin: no significant rashes, lesions, ulcers. Warm, dry, well-perfused. Neurologic: CN 2-12 grossly intact. Sensation intact, DTR normal.   Psychiatric: Alert and oriented to self only. Pleasant and cooperative.     Labs on Admission: I have personally reviewed following labs and imaging studies  CBC:  Recent Labs Lab 07/24/16 0723 07/25/16 0440 07/30/16 1124  WBC 6.7 6.3 5.1  NEUTROABS 5.1 4.1  --   HGB 7.5* 7.8* 8.3*  HCT 25.2* 25.9* 26.9*  MCV 76.8* 76.4* 75.4*  PLT 134* 144* 159   Basic Metabolic Panel:  Recent Labs Lab 07/24/16 0723 07/30/16 1124  NA 139 137  K 3.6 2.8*  CL 111 105  CO2 23 23  GLUCOSE 100* 155*  BUN 20 15  CREATININE 1.08* 1.14*  CALCIUM 7.3* 7.9*   GFR: Estimated Creatinine Clearance: 26.4 mL/min (by C-G formula based on SCr of 1.14 mg/dL (H)). Liver Function Tests:  Recent Labs Lab 07/30/16 1124  AST 29  ALT 14  ALKPHOS 39  BILITOT 0.3  PROT 5.5*  ALBUMIN 2.5*   No results for input(s): LIPASE, AMYLASE in the last 168 hours. No results for input(s): AMMONIA in the last 168 hours. Coagulation Profile: No results for input(s): INR, PROTIME in the last 168 hours. Cardiac Enzymes: No results for input(s): CKTOTAL, CKMB, CKMBINDEX, TROPONINI in the last 168 hours. BNP (last 3 results) No results for input(s): PROBNP in the last 8760 hours. HbA1C: No results for input(s): HGBA1C in the last 72 hours. CBG:  Recent Labs Lab 07/24/16 1704 07/25/16 0013 07/25/16 0549 07/25/16 0757 07/25/16 1114  GLUCAP 105* 99 95 101* 99   Lipid Profile: No results for input(s): CHOL, HDL, LDLCALC, TRIG, CHOLHDL, LDLDIRECT in the last 72 hours. Thyroid Function Tests: No results for input(s): TSH, T4TOTAL, FREET4, T3FREE, THYROIDAB in the last 72  hours. Anemia Panel: No results for input(s): VITAMINB12, FOLATE, FERRITIN, TIBC, IRON, RETICCTPCT in the last 72 hours. Urine analysis:    Component Value Date/Time   COLORURINE YELLOW 07/30/2016 1101   APPEARANCEUR CLEAR 07/30/2016 1101   LABSPEC 1.015 07/30/2016 1101   PHURINE 5.0 07/30/2016 1101   GLUCOSEU NEGATIVE 07/30/2016 1101   HGBUR NEGATIVE 07/30/2016 1101   BILIRUBINUR NEGATIVE 07/30/2016 1101   KETONESUR NEGATIVE 07/30/2016 1101   PROTEINUR NEGATIVE 07/30/2016 1101   UROBILINOGEN 0.2 11/02/2014 1025   NITRITE NEGATIVE 07/30/2016 1101   LEUKOCYTESUR TRACE (A) 07/30/2016 1101   Sepsis Labs: @LABRCNTIP (procalcitonin:4,lacticidven:4) )No results found for this or any previous visit (from the past 240 hour(s)).   Radiological Exams on Admission: No results found.  EKG: Independently reviewed. Sinus rhythm, APCs, non-specific IVCD  Assessment/Plan  1. Hypokalemia  - Serum potassium 2.8 on admission with unclear etiology  - She was treated with 60 mEq oral potassium in ED  - 2 g IV magnesium given on admission and 20 mEq KCl added to IVF  - No telemetry beds available in hospital; pt was monitored on telemetry for several hours in ED and repeat potassium is 3.1; plan to admit to med-surg unit and repeat EKG   - Repeat chem panel in am    2. Hypothyroidism  - TSH sent given presentation with weakness, returns elevated to 13  - Will increase Synthroid from 50 to 75 mcg qAM; can repeat TSH in several wks   3. Iron-deficiency anemia  - Hgb is stable on admission at 8.3 - No s/s of bleeding    4. Physical deconditioning  - Pt has been generally weak since recent hospital discharge, and unfortunately, this has been resulting in falls (fortunately without significant injury)  - Will address the electrolyte disturbances and hypothyroidism as above and ask for PT and social work to evaluate  5. Hypertension  - BP at goal on admission  - Continue Norvasc and quinapril  as tolerated    DVT prophylaxis: sq Lovenox  Code Status: Full  Family Communication: Discussed with patient Disposition Plan: Observe on med-surg Consults called: None Admission status: Observation    Briscoe Deutscherimothy S Cayla Wiegand, MD [facility] Pager [phone]  If 7PM-7AM, please contact night-coverage [email or web address] Password [email or web address]  07/30/2016, 5:42 PM

## 2016-07-30 NOTE — ED Triage Notes (Signed)
PT reports increased generalized weakness, with multiple falls at home over the past 5 days since d/c from the hospital on 07/25/16. PT denies any pain at this time.

## 2016-07-30 NOTE — ED Notes (Signed)
MD at bedside. 

## 2016-07-30 NOTE — ED Provider Notes (Signed)
[facility] Provider Note   CSN: [id number] Arrival date & time: 07/30/16  1036     History   Chief Complaint Chief Complaint  Patient presents with  . Weakness    HPI Elizabeth [name] is a 83 y.o. female.  HPI   83 year old female brought in for evaluation of generalized weakness. She was recently admitted to the hospital with colitis. She was discharged on January 3. Since then, she has had multiple falls. Generally weak. Family feels like they cannot sufficiently take care of her.They have been making the appropriate attempts at placement in both speaking with social work and seeing her PCP. She was seen in her PCP's office today and referred to the ED. They are interested in placement.   Past Medical History:  Diagnosis Date  . B12 deficiency   . C. difficile colitis Nov 2014   Treated with Flagyl  . Coronary artery disease   . GERD (gastroesophageal reflux disease)   . Hypercholesterolemia   . Hyperlipidemia   . Hypertension   . Hypothyroidism   . Osteoporosis   . Thyroid goiter    s/p surgery now on Synthroid  . Zenker diverticulum     Patient Active Problem List   Diagnosis Date Noted  . Iron deficiency anemia due to chronic blood loss   . Rectal bleeding 10/16/2014  . Anemia 10/16/2014  . Oropharyngeal dysphagia 11/02/2013  . Clostridium difficile colitis 06/01/2013  . Acute colitis 05/29/2013  . Hematochezia 05/29/2013  . HTN (hypertension), benign 05/29/2013  . Hypothyroidism 05/29/2013  . Hypotension, unspecified 05/29/2013  . ARF (acute renal failure) (HCC) 05/29/2013  . Anemia due to acute blood loss 05/29/2013  . Ischemic colitis (HCC) 09/02/2012  . Colitis 07/21/2012  . Viral syndrome 07/19/2012  . CAP (community acquired pneumonia), possibly 07/19/2012  . Dehydration 07/19/2012  . Diarrhea 07/19/2012  . Hyponatremia 07/19/2012  . GERD 02/27/2010  . Dysphagia 02/27/2010  . ESOPHAGEAL STRICTURE 11/30/2008    Past Surgical History:    Procedure Laterality Date  . ABDOMINAL HYSTERECTOMY    . APPENDECTOMY    . BUNIONECTOMY    . CORONARY ARTERY BYPASS GRAFT     Status Post four-vessel   . ESOPHAGEAL DILATION    . ESOPHAGOGASTRODUODENOSCOPY  6/10    Schatzki's ring, otherwise endoscopically normal esophagus, status post dilation (45F), with mucosal disruption through the cervical segment as well as the Schatzki's ring. Hiatal hernia. segment as well as the Schatzki's ring. Hiatal hernia.  Marland Kitchen ESOPHAGOGASTRODUODENOSCOPY  09/02/2007   Cervical esophageal web, Schatzki's ring, status post  dilation and disruption as described above. Otherwise, normal esophagus  . ESOPHAGOGASTRODUODENOSCOPY  August 2011   Zenker's diverticulum, Schatzki's ring s/p dilation, mod to large HH, instructions to pursue BPE if recurrent dysphagia to assess Zenker's  . ESOPHAGOGASTRODUODENOSCOPY  03/12/12   Rourk-dilated 19F, cervical web, Schatzki's ring, small HH, small Zenker's diverticulum  . ESOPHAGOGASTRODUODENOSCOPY N/A 11/26/2013   Dr. Jena Gauss: Zenker's diverticulum, cervical esophageal web and Schatzi's ring s/p dilation, large hiatal hernia, consider ENT referral if persistent dysphagia.   Marland Kitchen MALONEY DILATION N/A 11/26/2013   Procedure: Elease Hashimoto DILATION;  Surgeon: Corbin Ade, MD;  Location: [facility];  Service: Endoscopy;  Laterality: N/A;    OB History    Gravida Para Term Preterm AB Living             0   SAB TAB Ectopic Multiple Live Births  Home Medications    Prior to Admission medications   Medication Sig Start Date End Date Taking? Authorizing Provider  amLODipine (NORVASC) 5 MG tablet Take 5 mg by mouth daily after breakfast.  12/10/11  Yes Historical Provider, MD  aspirin 325 MG tablet Take 325 mg by mouth every morning.    Yes Historical Provider, MD  atorvastatin (LIPITOR) 10 MG tablet Take 10 mg by mouth every morning.  12/10/11  Yes Historical Provider, MD  ciprofloxacin (CIPRO) 500 MG tablet Take 1  tablet (500 mg total) by mouth daily. 07/25/16  Yes [name], MD  Ferrous Fumarate (HEMOCYTE - 106 MG FE) 324 (106 Fe) MG TABS tablet Take 1 tablet (106 mg of iron total) by mouth daily. 07/25/16  Yes [name], MD  levothyroxine (SYNTHROID, LEVOTHROID) 50 MCG tablet Take 50 mcg by mouth daily. 06/28/16  Yes Historical Provider, MD  metroNIDAZOLE (FLAGYL) 500 MG tablet Take 1 tablet (500 mg total) by mouth every 8 (eight) hours. 07/25/16  Yes [name], MD  quinapril (ACCUPRIL) 20 MG tablet Take 20 mg by mouth daily after breakfast.  12/10/11  Yes Historical Provider, MD  lansoprazole (PREVACID) 30 MG capsule Take 1 capsule (30 mg total) by mouth daily. Patient not taking: Reported on 07/30/2016 11/26/13   Corbin Ade, MD    Family History Family History  Problem Relation Age of Onset  . Colon cancer Neg Hx     Social History Social History  Substance Use Topics  . Smoking status: Never Smoker  . Smokeless tobacco: Never Used  . Alcohol use No     Allergies   Patient has no known allergies.   Review of Systems Review of Systems  All systems reviewed and negative, other than as noted in HPI.   Physical Exam Updated Vital Signs BP 144/75 (BP Location: Right Arm)   Pulse 73   Temp 98.3 F (36.8 C) (Rectal)   Resp 18   Ht 5\' 6"  (1.676 m)   Wt 125 lb (56.7 kg)   SpO2 98%   BMI 20.18 kg/m   Physical Exam  Constitutional: She appears well-developed and well-nourished. No distress.  HENT:  Head: Normocephalic and atraumatic.  Eyes: Conjunctivae are normal. Right eye exhibits no discharge. Left eye exhibits no discharge.  Neck: Neck supple.  Cardiovascular: Normal rate, regular rhythm and normal heart sounds.  Exam reveals no gallop and no friction rub.   No murmur heard. Pulmonary/Chest: Effort normal and breath sounds normal. No respiratory distress.  Abdominal: Soft. She exhibits no distension. There is no tenderness.  Musculoskeletal: She exhibits no edema  or tenderness.  Neurological: She is alert.  Very hard of hearing. Cranial nerves otherwise intact. Strength is 5 out of 5 bilateral upper lower extremities.  Skin: Skin is warm and dry.  Psychiatric: She has a normal mood and affect. Her behavior is normal. Thought content normal.  Nursing note and vitals reviewed.    ED Treatments / Results  Labs (all labs ordered are listed, but only abnormal results are displayed) Labs Reviewed  CBC - Abnormal; Notable for the following:       Result Value   RBC 3.57 (*)    Hemoglobin 8.3 (*)    HCT 26.9 (*)    MCV 75.4 (*)    MCH 23.2 (*)    RDW 21.0 (*)    All other components within normal limits  URINALYSIS, ROUTINE W REFLEX MICROSCOPIC - Abnormal; Notable for the following:  Leukocytes, UA TRACE (*)    All other components within normal limits  COMPREHENSIVE METABOLIC PANEL - Abnormal; Notable for the following:    Potassium 2.8 (*)    Glucose, Bld 155 (*)    Creatinine, Ser 1.14 (*)    Calcium 7.9 (*)    Total Protein 5.5 (*)    Albumin 2.5 (*)    GFR calc non Af Amer 40 (*)    GFR calc Af Amer 46 (*)    All other components within normal limits    EKG  EKG Interpretation  Date/Time:  Monday July 30 2016 13:43:45 EST Ventricular Rate:  77 PR Interval:    QRS Duration: 147 QT Interval:  475 QTC Calculation: 538 R Axis:   -4 Text Interpretation:  Sinus rhythm Atrial premature complex Borderline short PR interval Nonspecific intraventricular conduction delay Inferior infarct, old Confirmed by Juleen ChinaKOHUT  MD, Janalynn Eder ([id number]) on 07/30/2016 3:26:16 PM       Radiology No results found.  Procedures Procedures (including critical care time)  Medications Ordered in ED Medications  potassium chloride SA (K-DUR,KLOR-CON) CR tablet 60 mEq (60 mEq Oral Given 07/30/16 1503)     Initial Impression / Assessment and Plan / ED Course  I have reviewed the triage vital signs and the nursing notes.  Pertinent labs & imaging results  that were available during my care of the patient were reviewed by me and considered in my medical decision making (see chart for details).  Clinical Course     83 year old female with deconditioning and multiple recent falls. Unsafe living environment currently. His energy rested in pursuing placement. Noted to be hypokalemic. Will supplement. Will discuss with medicine for admission.  Final Clinical Impressions(s) / ED Diagnoses   Final diagnoses:  Generalized weakness  Hypokalemia    New Prescriptions New Prescriptions   No medications on file     Raeford RazorStephen Alinda Egolf, MD 07/30/16 1552

## 2016-07-30 NOTE — ED Notes (Signed)
[facility]

## 2016-07-31 DIAGNOSIS — E860 Dehydration: Secondary | ICD-10-CM | POA: Diagnosis not present

## 2016-07-31 DIAGNOSIS — E785 Hyperlipidemia, unspecified: Secondary | ICD-10-CM | POA: Diagnosis present

## 2016-07-31 DIAGNOSIS — Z79899 Other long term (current) drug therapy: Secondary | ICD-10-CM | POA: Diagnosis not present

## 2016-07-31 DIAGNOSIS — Z951 Presence of aortocoronary bypass graft: Secondary | ICD-10-CM | POA: Diagnosis not present

## 2016-07-31 DIAGNOSIS — I10 Essential (primary) hypertension: Secondary | ICD-10-CM | POA: Diagnosis present

## 2016-07-31 DIAGNOSIS — M6281 Muscle weakness (generalized): Secondary | ICD-10-CM | POA: Diagnosis not present

## 2016-07-31 DIAGNOSIS — R531 Weakness: Secondary | ICD-10-CM | POA: Diagnosis present

## 2016-07-31 DIAGNOSIS — D500 Iron deficiency anemia secondary to blood loss (chronic): Secondary | ICD-10-CM | POA: Diagnosis not present

## 2016-07-31 DIAGNOSIS — R296 Repeated falls: Secondary | ICD-10-CM | POA: Diagnosis present

## 2016-07-31 DIAGNOSIS — E876 Hypokalemia: Secondary | ICD-10-CM | POA: Diagnosis not present

## 2016-07-31 DIAGNOSIS — I2510 Atherosclerotic heart disease of native coronary artery without angina pectoris: Secondary | ICD-10-CM | POA: Diagnosis present

## 2016-07-31 DIAGNOSIS — R279 Unspecified lack of coordination: Secondary | ICD-10-CM | POA: Diagnosis not present

## 2016-07-31 DIAGNOSIS — K219 Gastro-esophageal reflux disease without esophagitis: Secondary | ICD-10-CM | POA: Diagnosis not present

## 2016-07-31 DIAGNOSIS — M810 Age-related osteoporosis without current pathological fracture: Secondary | ICD-10-CM | POA: Diagnosis present

## 2016-07-31 DIAGNOSIS — H9190 Unspecified hearing loss, unspecified ear: Secondary | ICD-10-CM | POA: Diagnosis present

## 2016-07-31 DIAGNOSIS — Z7982 Long term (current) use of aspirin: Secondary | ICD-10-CM | POA: Diagnosis not present

## 2016-07-31 DIAGNOSIS — R1312 Dysphagia, oropharyngeal phase: Secondary | ICD-10-CM | POA: Diagnosis not present

## 2016-07-31 DIAGNOSIS — R5381 Other malaise: Secondary | ICD-10-CM | POA: Diagnosis present

## 2016-07-31 DIAGNOSIS — R627 Adult failure to thrive: Secondary | ICD-10-CM | POA: Diagnosis not present

## 2016-07-31 DIAGNOSIS — R262 Difficulty in walking, not elsewhere classified: Secondary | ICD-10-CM | POA: Diagnosis not present

## 2016-07-31 DIAGNOSIS — F0390 Unspecified dementia without behavioral disturbance: Secondary | ICD-10-CM | POA: Diagnosis present

## 2016-07-31 DIAGNOSIS — E039 Hypothyroidism, unspecified: Secondary | ICD-10-CM | POA: Diagnosis present

## 2016-07-31 LAB — BASIC METABOLIC PANEL
Anion gap: 4 — ABNORMAL LOW (ref 5–15)
BUN: 12 mg/dL (ref 6–20)
CHLORIDE: 109 mmol/L (ref 101–111)
CO2: 23 mmol/L (ref 22–32)
CREATININE: 0.98 mg/dL (ref 0.44–1.00)
Calcium: 7.1 mg/dL — ABNORMAL LOW (ref 8.9–10.3)
GFR calc non Af Amer: 48 mL/min — ABNORMAL LOW (ref >60)
GFR, EST AFRICAN AMERICAN: 55 mL/min — AB (ref >60)
Glucose, Bld: 86 mg/dL (ref 65–99)
POTASSIUM: 3.6 mmol/L (ref 3.5–5.1)
Sodium: 136 mmol/L (ref 135–145)

## 2016-07-31 LAB — MAGNESIUM: Magnesium: 2.1 mg/dL (ref 1.7–2.4)

## 2016-07-31 MED ORDER — POTASSIUM CHLORIDE CRYS ER 20 MEQ PO TBCR
40.0000 meq | EXTENDED_RELEASE_TABLET | Freq: Once | ORAL | Status: AC
  Administered 2016-07-31: 40 meq via ORAL
  Filled 2016-07-31: qty 2

## 2016-07-31 MED ORDER — SODIUM CHLORIDE 0.9 % IV SOLN
INTRAVENOUS | Status: DC
  Administered 2016-07-31 – 2016-08-01 (×3): via INTRAVENOUS

## 2016-07-31 NOTE — NC FL2 (Signed)
[place] MEDICAID FL2 LEVEL OF CARE SCREENING TOOL     IDENTIFICATION  Patient Name: Elizabeth [name] Birthdate: Oct 21, 1918 Sex: female Admission Date (Current Location): 07/30/2016  [facility] [place]   Facility and Address:  [facility],  [address], [place] [id number]      Provider Number: [id number]  Attending Physician Name and Address:  Kari BaarsEdward Hawkins, MD  Relative Name and Phone Number:       Current Level of Care:   Recommended Level of Care:   Prior Approval Number:    Date Approved/Denied:   PASRR Number: [id number]  Discharge Plan: Home    Current Diagnoses: Patient Active Problem List   Diagnosis Date Noted  . Hypokalemia 07/30/2016  . Physical deconditioning 07/30/2016  . Generalized weakness   . Iron deficiency anemia due to chronic blood loss   . Rectal bleeding 10/16/2014  . Anemia 10/16/2014  . Oropharyngeal dysphagia 11/02/2013  . Clostridium difficile colitis 06/01/2013  . Acute colitis 05/29/2013  . Hematochezia 05/29/2013  . HTN (hypertension), benign 05/29/2013  . Hypothyroidism 05/29/2013  . Hypotension, unspecified 05/29/2013  . ARF (acute renal failure) (HCC) 05/29/2013  . Anemia due to acute blood loss 05/29/2013  . Ischemic colitis (HCC) 09/02/2012  . Colitis 07/21/2012  . Viral syndrome 07/19/2012  . CAP (community acquired pneumonia), possibly 07/19/2012  . Dehydration 07/19/2012  . Diarrhea 07/19/2012  . Hyponatremia 07/19/2012  . GERD 02/27/2010  . Dysphagia 02/27/2010  . ESOPHAGEAL STRICTURE 11/30/2008    Orientation RESPIRATION BLADDER Height & Weight     Self, Place  Normal Continent Weight: 135 lb (61.2 kg) Height:  5\' 6"  (167.6 cm)  BEHAVIORAL SYMPTOMS/MOOD NEUROLOGICAL BOWEL NUTRITION STATUS      Continent Diet (Regular)  AMBULATORY STATUS COMMUNICATION OF NEEDS Skin   Limited Assist Verbally Normal                       Personal Care Assistance Level of Assistance   Bathing, Dressing, Feeding Bathing Assistance: Limited assistance Feeding assistance: Independent Dressing Assistance: Limited assistance     Functional Limitations Info  Sight, Hearing, Speech Sight Info: Adequate Hearing Info: Impaired (HOH) Speech Info: Adequate    SPECIAL CARE FACTORS FREQUENCY  PT (By licensed PT) (5x/week)     PT Frequency: 5x/week              Contractures Contractures Info: Not present    Additional Factors Info    Code Status Info: Full             Current Medications (07/31/2016):  This is the current hospital active medication list Current Facility-Administered Medications  Medication Dose Route Frequency Provider Last Rate Last Dose  . 0.9 %  sodium chloride infusion   Intravenous Continuous Kari BaarsEdward Hawkins, MD 75 mL/hr at 07/31/16 1049    . acetaminophen (TYLENOL) tablet 650 mg  650 mg Oral Q6H PRN Briscoe Deutscherimothy S Opyd, MD       Or  . acetaminophen (TYLENOL) suppository 650 mg  650 mg Rectal Q6H PRN Lavone Neriimothy S Opyd, MD      . amLODipine (NORVASC) tablet 5 mg  5 mg Oral QPC breakfast Briscoe Deutscherimothy S Opyd, MD      . aspirin tablet 325 mg  325 mg Oral q morning - 10a Briscoe Deutscherimothy S Opyd, MD   325 mg at 07/31/16 1044  . atorvastatin (LIPITOR) tablet 10 mg  10 mg Oral q1800 Briscoe Deutscherimothy S Opyd, MD  10 mg at 07/30/16 2129  . bisacodyl (DULCOLAX) EC tablet 5 mg  5 mg Oral Daily PRN Lavone Neri Opyd, MD      . enoxaparin (LOVENOX) injection 30 mg  30 mg Subcutaneous Q24H Briscoe Deutscher, MD   30 mg at 07/30/16 1825  . Ferrous Fumarate (HEMOCYTE - 106 mg FE) tablet 106 mg of iron  1 tablet Oral Daily Briscoe Deutscher, MD   106 mg of iron at 07/31/16 1044  . HYDROcodone-acetaminophen (NORCO/VICODIN) 5-325 MG per tablet 1-2 tablet  1-2 tablet Oral Q4H PRN Briscoe Deutscher, MD      . levothyroxine (SYNTHROID, LEVOTHROID) tablet 75 mcg  75 mcg Oral QAC breakfast Briscoe Deutscher, MD   75 mcg at 07/31/16 1044  . lisinopril (PRINIVIL,ZESTRIL) tablet 20 mg  20 mg Oral Daily Timothy S  Opyd, MD      . ondansetron (ZOFRAN) tablet 4 mg  4 mg Oral Q6H PRN Briscoe Deutscher, MD       Or  . ondansetron (ZOFRAN) injection 4 mg  4 mg Intravenous Q6H PRN Timothy S Opyd, MD      . polyethylene glycol (MIRALAX / GLYCOLAX) packet 17 g  17 g Oral Daily PRN Briscoe Deutscher, MD         Discharge Medications: Please see discharge summary for a list of discharge medications.  Relevant Imaging Results:  Relevant Lab Results:   Additional Information SSN [id number], Juleen China, LCSW

## 2016-07-31 NOTE — Evaluation (Signed)
Physical Therapy Evaluation Patient Details Name: Elizabeth [name] MRN: [id number] DOB: 25-Nov-1918 Today's Date: 07/31/2016   History of Present Illness  Elizabeth [name] is a pleaseant 83yo white [facility] with progressive weakness annd multiple falls at home. She was here 1WA for collitis, not seen by PT then. At baseline, she lives with a niece who she reports has some nonspecific IDD. She has dementia and is HOH, PMH includes HTN, anemia, and hypothyroidism. Last labs reveal Hb: 8.2, HCT: 24.0.   Clinical Impression  Pt admitted with above diagnosis. Pt currently with functional limitations due to the deficits listed below (see "PT Problem List"). Upon entry, the patient is received semirecumbent in bed, no family/caregiver present. The pt is awake and agreeable to participate. No acute distress noted at this time. The pt is alert and oriented to self and location, pleasant, conversational, and following simple and multi-step commands consistently, however severe HOH decreases the quality of communication with the patient. Grip strength is equal bilat, and mildly impaired. Functional mobility assessment demonstrates mild-moderate weakness, the pt now requiring supervision level assistance for bed mobility, and min-mod assist physical assistance fo recovery from multiple LOB during transfers, and gait, whereas the patient performs these with modified independence at baseline. The patient is at high risk for falls as evidence by gait speed <1.6845m/s, forward reach <5", and multiple LOB demonstrated throughout session; falls history is also predictive. Pt will benefit from skilled PT intervention to increase independence and safety with basic mobility in preparation for discharge to the venue listed below.       Follow Up Recommendations SNF    Equipment Recommendations  None recommended by PT    Recommendations for Other Services       Precautions / Restrictions  Precautions Precautions: Fall Restrictions Weight Bearing Restrictions: No      Mobility  Bed Mobility Overal bed mobility: Needs Assistance Bed Mobility: Supine to Sit     Supine to sit: Supervision     General bed mobility comments: supervision for safety  Transfers Overall transfer level: Needs assistance Equipment used: 1 person hand held assist Transfers: Sit to/from Stand Sit to Stand: Min assist         General transfer comment: minassist for fLOB recovery. Very unsteady, approaching ataxic.   Ambulation/Gait Ambulation/Gait assistance: Mod assist Ambulation Distance (Feet): 40 Feet Assistive device: 1 person hand held assist Gait Pattern/deviations: Staggering right;Drifts right/left;Staggering left;Narrow base of support   Gait velocity interpretation: <1.8 ft/sec, indicative of risk for recurrent falls General Gait Details: heavy scissoring,  limited confidence, requires Min-modA for recovery of LOB, heavy use of UE support on walls, furtniture. Limited safety awareness and poor insight.   Stairs            Wheelchair Mobility    Modified Rankin (Stroke Patients Only)       Balance Overall balance assessment: History of Falls;Needs assistance Sitting-balance support: Feet supported Sitting balance-Leahy Scale: Good     Standing balance support: Single extremity supported;During functional activity Standing balance-Leahy Scale: Poor               High level balance activites: Turns High Level Balance Comments: several LOB during 2 minute gait trial, DOE throughout.              Pertinent Vitals/Pain Pain Assessment: No/denies pain    Home Living Family/patient expects to be discharged to:: Private residence Living Arrangements: Other relatives Available Help at Discharge: Family;Available  24 hours/day Type of Home: House         Home Equipment: Walker - 2 wheels;Cane - single point Additional Comments: reports she does  not use AD in home, but holds onto her furniture.     Prior Function           Comments: unclear at this time due to dementia. ~2YA, pt performed mostly household distance walking holding onto furniture for balance. Likely requires assistance for IADL.      Hand Dominance        Extremity/Trunk Assessment        Lower Extremity Assessment Lower Extremity Assessment: Generalized weakness       Communication   Communication: HOH  Cognition Arousal/Alertness: Awake/alert Behavior During Therapy: WFL for tasks assessed/performed Overall Cognitive Status: No family/caregiver present to determine baseline cognitive functioning Area of Impairment: Orientation Orientation Level: Person;Place                  General Comments      Exercises     Assessment/Plan    PT Assessment Patient needs continued PT services  PT Problem List Decreased strength;Decreased mobility;Decreased balance;Decreased coordination;Cardiopulmonary status limiting activity;Decreased activity tolerance          PT Treatment Interventions Gait training;Stair training;Functional mobility training;Therapeutic activities;Therapeutic exercise;Balance training;Patient/family education    PT Goals (Current goals can be found in the Care Plan section)  Acute Rehab PT Goals PT Goal Formulation: Patient unable to participate in goal setting    Frequency Min 2X/week   Barriers to discharge Inaccessible home environment      Co-evaluation               End of Session Equipment Utilized During Treatment: Gait belt Activity Tolerance: Patient limited by fatigue;Patient tolerated treatment well Patient left: in bed;with nursing/sitter in room Nurse Communication: Mobility status;Other (comment)         Time: 1610-9604 PT Time Calculation (min) (ACUTE ONLY): 10 min   Charges:   PT Evaluation $PT Eval Moderate Complexity: 1 Procedure     PT G Codes:        2:08 PM,  08/09/2016 Rosamaria Lints, PT, DPT Physical Therapist - [place] [phone] (mobile)

## 2016-07-31 NOTE — Progress Notes (Signed)
Subjective: She was admitted yesterday after multiple falls was found to be hypokalemic. She is very hard of hearing and has confusion when she has acute illnesses so at this point that's about all the history that we can get. Her family had difficulty caring for her at home after she was discharged from the hospital on 07/25/2016. This morning she is confused. No specific complaints  Objective: Vital signs in last 24 hours: Temp:  [97.8 F (36.6 C)-98.3 F (36.8 C)] 97.8 F (36.6 C) (01/09 0541) Pulse Rate:  [70-91] 76 (01/09 0541) Resp:  [15-23] 18 (01/09 0541) BP: (95-157)/(62-84) 142/65 (01/09 0541) SpO2:  [92 %-99 %] 95 % (01/09 0541) Weight:  [56.7 kg (125 lb)-61.2 kg (135 lb)] 61.2 kg (135 lb) (01/09 0541) Weight change:  Last BM Date: 07/29/16 (Pt reported, but poor historian. )  Intake/Output from previous day: 01/08 0701 - 01/09 0700 In: 621.3 [I.V.:621.3] Out: -   PHYSICAL EXAM General appearance: alert and Confused Resp: clear to auscultation bilaterally Cardio: regular rate and rhythm, S1, S2 normal, no murmur, click, rub or gallop GI: soft, non-tender; bowel sounds normal; no masses,  no organomegaly Extremities: extremities normal, atraumatic, no cyanosis or edema Skin warm and dry. Mucous membranes dry. She is very hard of hearing.  Lab Results:  Results for orders placed or performed during the hospital encounter of 07/30/16 (from the past 48 hour(s))  Urinalysis, Routine w reflex microscopic     Status: Abnormal   Collection Time: 07/30/16 11:01 AM  Result Value Ref Range   Color, Urine YELLOW YELLOW   APPearance CLEAR CLEAR   Specific Gravity, Urine 1.015 1.005 - 1.030   pH 5.0 5.0 - 8.0   Glucose, UA NEGATIVE NEGATIVE mg/dL   Hgb urine dipstick NEGATIVE NEGATIVE   Bilirubin Urine NEGATIVE NEGATIVE   Ketones, ur NEGATIVE NEGATIVE mg/dL   Protein, ur NEGATIVE NEGATIVE mg/dL   Nitrite NEGATIVE NEGATIVE   Leukocytes, UA TRACE (A) NEGATIVE   RBC / HPF 0-5  0 - 5 RBC/hpf   WBC, UA 0-5 0 - 5 WBC/hpf   Bacteria, UA NONE SEEN NONE SEEN  CBC     Status: Abnormal   Collection Time: 07/30/16 11:24 AM  Result Value Ref Range   WBC 5.1 4.0 - 10.5 K/uL   RBC 3.57 (L) 3.87 - 5.11 MIL/uL   Hemoglobin 8.3 (L) 12.0 - 15.0 g/dL   HCT 26.9 (L) 36.0 - 46.0 %   MCV 75.4 (L) 78.0 - 100.0 fL   MCH 23.2 (L) 26.0 - 34.0 pg   MCHC 30.9 30.0 - 36.0 g/dL   RDW 21.0 (H) 11.5 - 15.5 %   Platelets 159 150 - 400 K/uL  Comprehensive metabolic panel     Status: Abnormal   Collection Time: 07/30/16 11:24 AM  Result Value Ref Range   Sodium 137 135 - 145 mmol/L   Potassium 2.8 (L) 3.5 - 5.1 mmol/L   Chloride 105 101 - 111 mmol/L   CO2 23 22 - 32 mmol/L   Glucose, Bld 155 (H) 65 - 99 mg/dL   BUN 15 6 - 20 mg/dL   Creatinine, Ser 1.14 (H) 0.44 - 1.00 mg/dL   Calcium 7.9 (L) 8.9 - 10.3 mg/dL   Total Protein 5.5 (L) 6.5 - 8.1 g/dL   Albumin 2.5 (L) 3.5 - 5.0 g/dL   AST 29 15 - 41 U/L   ALT 14 14 - 54 U/L   Alkaline Phosphatase 39 38 - 126 U/L  Total Bilirubin 0.3 0.3 - 1.2 mg/dL   GFR calc non Af Amer 40 (L) >60 mL/min   GFR calc Af Amer 46 (L) >60 mL/min    Comment: (NOTE) The eGFR has been calculated using the CKD EPI equation. This calculation has not been validated in all clinical situations. eGFR's persistently <60 mL/min signify possible Chronic Kidney Disease.    Anion gap 9 5 - 15  TSH     Status: Abnormal   Collection Time: 07/30/16  5:48 PM  Result Value Ref Range   TSH 13.153 (H) 0.350 - 4.500 uIU/mL    Comment: Performed by a 3rd Generation assay with a functional sensitivity of <=0.01 uIU/mL.  Type and screen [facility]     Status: None   Collection Time: 07/30/16  5:48 PM  Result Value Ref Range   ABO/RH(D) O POS    Antibody Screen NEG    Sample Expiration 08/02/2016   I-stat chem 8, ed     Status: Abnormal   Collection Time: 07/30/16  8:08 PM  Result Value Ref Range   Sodium 140 135 - 145 mmol/L   Potassium 3.1 (L) 3.5 - 5.1  mmol/L   Chloride 105 101 - 111 mmol/L   BUN 12 6 - 20 mg/dL   Creatinine, Ser 1.00 0.44 - 1.00 mg/dL   Glucose, Bld 92 65 - 99 mg/dL   Calcium, Ion 1.04 (L) 1.15 - 1.40 mmol/L   TCO2 24 0 - 100 mmol/L   Hemoglobin 8.2 (L) 12.0 - 15.0 g/dL   HCT 24.0 (L) 36.0 - 46.0 %  Magnesium     Status: None   Collection Time: 07/31/16  5:12 AM  Result Value Ref Range   Magnesium 2.1 1.7 - 2.4 mg/dL  Basic metabolic panel     Status: Abnormal   Collection Time: 07/31/16  5:12 AM  Result Value Ref Range   Sodium 136 135 - 145 mmol/L   Potassium 3.6 3.5 - 5.1 mmol/L   Chloride 109 101 - 111 mmol/L   CO2 23 22 - 32 mmol/L   Glucose, Bld 86 65 - 99 mg/dL   BUN 12 6 - 20 mg/dL   Creatinine, Ser 0.98 0.44 - 1.00 mg/dL   Calcium 7.1 (L) 8.9 - 10.3 mg/dL   GFR calc non Af Amer 48 (L) >60 mL/min   GFR calc Af Amer 55 (L) >60 mL/min    Comment: (NOTE) The eGFR has been calculated using the CKD EPI equation. This calculation has not been validated in all clinical situations. eGFR's persistently <60 mL/min signify possible Chronic Kidney Disease.    Anion gap 4 (L) 5 - 15    ABGS  Recent Labs  07/30/16 2008  TCO2 24   CULTURES No results found for this or any previous visit (from the past 240 hour(s)). Studies/Results: No results found.  Medications:  Prior to Admission:  Prescriptions Prior to Admission  Medication Sig Dispense Refill Last Dose  . amLODipine (NORVASC) 5 MG tablet Take 5 mg by mouth daily after breakfast.    07/29/2016 at Unknown time  . aspirin 325 MG tablet Take 325 mg by mouth every morning.    07/29/2016 at Unknown time  . atorvastatin (LIPITOR) 10 MG tablet Take 10 mg by mouth every morning.    07/29/2016 at Unknown time  . ciprofloxacin (CIPRO) 500 MG tablet Take 1 tablet (500 mg total) by mouth daily. 14 tablet 0 07/29/2016 at Unknown time  . Ferrous Fumarate (  HEMOCYTE - 106 MG FE) 324 (106 Fe) MG TABS tablet Take 1 tablet (106 mg of iron total) by mouth daily. 30 tablet  5 07/29/2016 at Unknown time  . levothyroxine (SYNTHROID, LEVOTHROID) 50 MCG tablet Take 50 mcg by mouth daily.  3 07/29/2016 at Unknown time  . metroNIDAZOLE (FLAGYL) 500 MG tablet Take 1 tablet (500 mg total) by mouth every 8 (eight) hours. 21 tablet 0 07/29/2016 at Unknown time  . quinapril (ACCUPRIL) 20 MG tablet Take 20 mg by mouth daily after breakfast.    07/29/2016 at Unknown time  . lansoprazole (PREVACID) 30 MG capsule Take 1 capsule (30 mg total) by mouth daily. (Patient not taking: Reported on 07/30/2016) 30 capsule 11 Not Taking at Unknown time   Scheduled: . amLODipine  5 mg Oral QPC breakfast  . aspirin  325 mg Oral q morning - 10a  . atorvastatin  10 mg Oral q1800  . enoxaparin (LOVENOX) injection  30 mg Subcutaneous Q24H  . Ferrous Fumarate  1 tablet Oral Daily  . levothyroxine  75 mcg Oral QAC breakfast  . lisinopril  20 mg Oral Daily   Continuous: . sodium chloride     [id number] **OR** acetaminophen, bisacodyl, HYDROcodone-acetaminophen, ondansetron **OR** ondansetron (ZOFRAN) IV, polyethylene glycol  Assesment: She was brought in for observation but I'm going to make her a full admission. I will start IV fluids because I think she is dehydrated. Her potassium has been replaced and is better. She has chronic anemia but not at a level that needs a blood transfusion yet. She has generalized weakness and physical deconditioning and failure to thrive. I think she's going to need placement. Principal Problem:   Hypokalemia Active Problems:   HTN (hypertension), benign   Iron deficiency anemia due to chronic blood loss   Physical deconditioning   Generalized weakness    Plan: As above    LOS: 0 days   Trebor Galdamez L 07/31/2016, 8:38 AM

## 2016-08-01 MED ORDER — ORAL CARE MOUTH RINSE
15.0000 mL | Freq: Two times a day (BID) | OROMUCOSAL | Status: DC
  Administered 2016-08-01 – 2016-08-02 (×4): 15 mL via OROMUCOSAL

## 2016-08-01 MED ORDER — CHLORHEXIDINE GLUCONATE 0.12 % MT SOLN
15.0000 mL | Freq: Two times a day (BID) | OROMUCOSAL | Status: DC
Start: 2016-08-01 — End: 2016-08-02
  Administered 2016-08-01 – 2016-08-02 (×3): 15 mL via OROMUCOSAL
  Filled 2016-08-01 (×2): qty 15

## 2016-08-01 NOTE — Progress Notes (Signed)
Patient given some more drink at this time.

## 2016-08-01 NOTE — Progress Notes (Signed)
Patient called for assistance with the television. Patient doesn't need anything else at this time.

## 2016-08-01 NOTE — Progress Notes (Signed)
Patient incontinent, full linen changed, patient bathed.

## 2016-08-01 NOTE — Progress Notes (Signed)
Subjective: She says she feels better and is much more alert this morning. Her blood pressure has been low so I have held her blood pressure medications. PT has seen her and recommended skilled care facility which I think is appropriate. Communication is still difficult because she is so hard of hearing. She has no new complaints. No diarrhea no nausea or vomiting. No chest pain.  Objective: Vital signs in last 24 hours: Temp:  [98.1 F (36.7 C)-98.9 F (37.2 C)] 98.9 F (37.2 C) (01/10 0646) Pulse Rate:  [86-88] 88 (01/10 0646) Resp:  [16-20] 16 (01/10 0646) BP: (95-134)/(48-91) 123/76 (01/10 0646) SpO2:  [94 %-98 %] 97 % (01/10 0646) Weight:  [63 kg (138 lb 12.8 oz)] 63 kg (138 lb 12.8 oz) (01/10 0646) Weight change: 6.26 kg (13 lb 12.8 oz) Last BM Date: 07/29/16  Intake/Output from previous day: 01/09 0701 - 01/10 0700 In: 1033.8 [P.O.:720; I.V.:313.8] Out: 2 [Urine:1; Stool:1]  PHYSICAL EXAM General appearance: alert, cooperative, mild distress and Very hard of hearing Resp: clear to auscultation bilaterally Cardio: regular rate and rhythm, S1, S2 normal, no murmur, click, rub or gallop GI: soft, non-tender; bowel sounds normal; no masses,  no organomegaly Extremities: extremities normal, atraumatic, no cyanosis or edema Mucous membranes are still somewhat dry. Pupils reactive.  Lab Results:  Results for orders placed or performed during the hospital encounter of 07/30/16 (from the past 48 hour(s))  Urinalysis, Routine w reflex microscopic     Status: Abnormal   Collection Time: 07/30/16 11:01 AM  Result Value Ref Range   Color, Urine YELLOW YELLOW   APPearance CLEAR CLEAR   Specific Gravity, Urine 1.015 1.005 - 1.030   pH 5.0 5.0 - 8.0   Glucose, UA NEGATIVE NEGATIVE mg/dL   Hgb urine dipstick NEGATIVE NEGATIVE   Bilirubin Urine NEGATIVE NEGATIVE   Ketones, ur NEGATIVE NEGATIVE mg/dL   Protein, ur NEGATIVE NEGATIVE mg/dL   Nitrite NEGATIVE NEGATIVE   Leukocytes, UA  TRACE (A) NEGATIVE   RBC / HPF 0-5 0 - 5 RBC/hpf   WBC, UA 0-5 0 - 5 WBC/hpf   Bacteria, UA NONE SEEN NONE SEEN  CBC     Status: Abnormal   Collection Time: 07/30/16 11:24 AM  Result Value Ref Range   WBC 5.1 4.0 - 10.5 K/uL   RBC 3.57 (L) 3.87 - 5.11 MIL/uL   Hemoglobin 8.3 (L) 12.0 - 15.0 g/dL   HCT 26.9 (L) 36.0 - 46.0 %   MCV 75.4 (L) 78.0 - 100.0 fL   MCH 23.2 (L) 26.0 - 34.0 pg   MCHC 30.9 30.0 - 36.0 g/dL   RDW 21.0 (H) 11.5 - 15.5 %   Platelets 159 150 - 400 K/uL  Comprehensive metabolic panel     Status: Abnormal   Collection Time: 07/30/16 11:24 AM  Result Value Ref Range   Sodium 137 135 - 145 mmol/L   Potassium 2.8 (L) 3.5 - 5.1 mmol/L   Chloride 105 101 - 111 mmol/L   CO2 23 22 - 32 mmol/L   Glucose, Bld 155 (H) 65 - 99 mg/dL   BUN 15 6 - 20 mg/dL   Creatinine, Ser 1.14 (H) 0.44 - 1.00 mg/dL   Calcium 7.9 (L) 8.9 - 10.3 mg/dL   Total Protein 5.5 (L) 6.5 - 8.1 g/dL   Albumin 2.5 (L) 3.5 - 5.0 g/dL   AST 29 15 - 41 U/L   ALT 14 14 - 54 U/L   Alkaline Phosphatase 39 38 -  126 U/L   Total Bilirubin 0.3 0.3 - 1.2 mg/dL   GFR calc non Af Amer 40 (L) >60 mL/min   GFR calc Af Amer 46 (L) >60 mL/min    Comment: (NOTE) The eGFR has been calculated using the CKD EPI equation. This calculation has not been validated in all clinical situations. eGFR's persistently <60 mL/min signify possible Chronic Kidney Disease.    Anion gap 9 5 - 15  TSH     Status: Abnormal   Collection Time: 07/30/16  5:48 PM  Result Value Ref Range   TSH 13.153 (H) 0.350 - 4.500 uIU/mL    Comment: Performed by a 3rd Generation assay with a functional sensitivity of <=0.01 uIU/mL.  Type and screen [facility]     Status: None   Collection Time: 07/30/16  5:48 PM  Result Value Ref Range   ABO/RH(D) O POS    Antibody Screen NEG    Sample Expiration 08/02/2016   I-stat chem 8, ed     Status: Abnormal   Collection Time: 07/30/16  8:08 PM  Result Value Ref Range   Sodium 140 135 - 145  mmol/L   Potassium 3.1 (L) 3.5 - 5.1 mmol/L   Chloride 105 101 - 111 mmol/L   BUN 12 6 - 20 mg/dL   Creatinine, Ser 1.00 0.44 - 1.00 mg/dL   Glucose, Bld 92 65 - 99 mg/dL   Calcium, Ion 1.04 (L) 1.15 - 1.40 mmol/L   TCO2 24 0 - 100 mmol/L   Hemoglobin 8.2 (L) 12.0 - 15.0 g/dL   HCT 24.0 (L) 36.0 - 46.0 %  Magnesium     Status: None   Collection Time: 07/31/16  5:12 AM  Result Value Ref Range   Magnesium 2.1 1.7 - 2.4 mg/dL  Basic metabolic panel     Status: Abnormal   Collection Time: 07/31/16  5:12 AM  Result Value Ref Range   Sodium 136 135 - 145 mmol/L   Potassium 3.6 3.5 - 5.1 mmol/L   Chloride 109 101 - 111 mmol/L   CO2 23 22 - 32 mmol/L   Glucose, Bld 86 65 - 99 mg/dL   BUN 12 6 - 20 mg/dL   Creatinine, Ser 0.98 0.44 - 1.00 mg/dL   Calcium 7.1 (L) 8.9 - 10.3 mg/dL   GFR calc non Af Amer 48 (L) >60 mL/min   GFR calc Af Amer 55 (L) >60 mL/min    Comment: (NOTE) The eGFR has been calculated using the CKD EPI equation. This calculation has not been validated in all clinical situations. eGFR's persistently <60 mL/min signify possible Chronic Kidney Disease.    Anion gap 4 (L) 5 - 15    ABGS  Recent Labs  07/30/16 2008  TCO2 24   CULTURES No results found for this or any previous visit (from the past 240 hour(s)). Studies/Results: No results found.  Medications:  Prior to Admission:  Prescriptions Prior to Admission  Medication Sig Dispense Refill Last Dose  . amLODipine (NORVASC) 5 MG tablet Take 5 mg by mouth daily after breakfast.    07/29/2016 at Unknown time  . aspirin 325 MG tablet Take 325 mg by mouth every morning.    07/29/2016 at Unknown time  . atorvastatin (LIPITOR) 10 MG tablet Take 10 mg by mouth every morning.    07/29/2016 at Unknown time  . ciprofloxacin (CIPRO) 500 MG tablet Take 1 tablet (500 mg total) by mouth daily. 14 tablet 0 07/29/2016 at Unknown time  .  Ferrous Fumarate (HEMOCYTE - 106 MG FE) 324 (106 Fe) MG TABS tablet Take 1 tablet (106 mg of  iron total) by mouth daily. 30 tablet 5 07/29/2016 at Unknown time  . levothyroxine (SYNTHROID, LEVOTHROID) 50 MCG tablet Take 50 mcg by mouth daily.  3 07/29/2016 at Unknown time  . metroNIDAZOLE (FLAGYL) 500 MG tablet Take 1 tablet (500 mg total) by mouth every 8 (eight) hours. 21 tablet 0 07/29/2016 at Unknown time  . quinapril (ACCUPRIL) 20 MG tablet Take 20 mg by mouth daily after breakfast.    07/29/2016 at Unknown time  . lansoprazole (PREVACID) 30 MG capsule Take 1 capsule (30 mg total) by mouth daily. (Patient not taking: Reported on 07/30/2016) 30 capsule 11 Not Taking at Unknown time   Scheduled: . aspirin  325 mg Oral q morning - 10a  . atorvastatin  10 mg Oral q1800  . enoxaparin (LOVENOX) injection  30 mg Subcutaneous Q24H  . Ferrous Fumarate  1 tablet Oral Daily  . levothyroxine  75 mcg Oral QAC breakfast   Continuous: . sodium chloride 75 mL/hr at 07/31/16 2326   [id number] **OR** acetaminophen, bisacodyl, HYDROcodone-acetaminophen, ondansetron **OR** ondansetron (ZOFRAN) IV, polyethylene glycol  Assesment: She was admitted with multiple falls, hypokalemia, iron deficiency anemia, hypertension, physical deconditioning and generalized weakness. She is better. She has been hypotensive so I have stopped her blood pressure medication for now. I think she is still somewhat dry so on going to continue IV fluids. She was hypokalemic and that's better. Principal Problem:   Hypokalemia Active Problems:   HTN (hypertension), benign   Iron deficiency anemia due to chronic blood loss   Physical deconditioning   Generalized weakness    Plan: Continue IV fluids. Continue holding blood pressure medication today at least. No other new treatments. Continue PT    LOS: 1 day   Jemaine Prokop L 08/01/2016, 8:14 AM

## 2016-08-02 ENCOUNTER — Inpatient Hospital Stay
Admission: RE | Admit: 2016-08-02 | Discharge: 2016-09-12 | Disposition: A | Discharge status: Home / Self Care | Payer: Medicare Other | Source: Ambulatory Visit | Attending: Pulmonary Disease | Admitting: Pulmonary Disease

## 2016-08-02 DIAGNOSIS — R627 Adult failure to thrive: Secondary | ICD-10-CM | POA: Diagnosis not present

## 2016-08-02 DIAGNOSIS — E876 Hypokalemia: Secondary | ICD-10-CM | POA: Diagnosis not present

## 2016-08-02 DIAGNOSIS — M25531 Pain in right wrist: Secondary | ICD-10-CM | POA: Diagnosis not present

## 2016-08-02 DIAGNOSIS — R1312 Dysphagia, oropharyngeal phase: Secondary | ICD-10-CM | POA: Diagnosis not present

## 2016-08-02 DIAGNOSIS — M7989 Other specified soft tissue disorders: Secondary | ICD-10-CM | POA: Diagnosis not present

## 2016-08-02 DIAGNOSIS — D500 Iron deficiency anemia secondary to blood loss (chronic): Secondary | ICD-10-CM | POA: Diagnosis not present

## 2016-08-02 DIAGNOSIS — E860 Dehydration: Secondary | ICD-10-CM | POA: Diagnosis not present

## 2016-08-02 DIAGNOSIS — R262 Difficulty in walking, not elsewhere classified: Secondary | ICD-10-CM | POA: Diagnosis not present

## 2016-08-02 DIAGNOSIS — M6281 Muscle weakness (generalized): Secondary | ICD-10-CM | POA: Diagnosis not present

## 2016-08-02 DIAGNOSIS — R279 Unspecified lack of coordination: Secondary | ICD-10-CM | POA: Diagnosis not present

## 2016-08-02 DIAGNOSIS — K219 Gastro-esophageal reflux disease without esophagitis: Secondary | ICD-10-CM | POA: Diagnosis not present

## 2016-08-02 DIAGNOSIS — M79641 Pain in right hand: Secondary | ICD-10-CM | POA: Diagnosis not present

## 2016-08-02 MED ORDER — BISACODYL 5 MG PO TBEC: 5.0000 mg | DELAYED_RELEASE_TABLET | Freq: Every day | ORAL | 0 refills | Status: DC | PRN

## 2016-08-02 MED ORDER — ASPIRIN EC 81 MG PO TBEC: 81.0000 mg | DELAYED_RELEASE_TABLET | Freq: Every day | ORAL | Status: DC

## 2016-08-02 MED ORDER — POLYETHYLENE GLYCOL 3350 17 G PO PACK: 17.0000 g | PACK | Freq: Every day | ORAL | 0 refills | Status: AC | PRN

## 2016-08-02 MED ORDER — OMEPRAZOLE 20 MG PO CPDR: 20.0000 mg | DELAYED_RELEASE_CAPSULE | Freq: Every day | ORAL | Status: DC

## 2016-08-02 MED ORDER — ACETAMINOPHEN 325 MG PO TABS: 650.0000 mg | ORAL_TABLET | Freq: Four times a day (QID) | ORAL | Status: DC | PRN

## 2016-08-02 MED ORDER — LEVOTHYROXINE SODIUM 75 MCG PO TABS: 75.0000 ug | ORAL_TABLET | Freq: Every day | ORAL | Status: DC

## 2016-08-02 MED ORDER — ONDANSETRON HCL 4 MG PO TABS: 4.0000 mg | ORAL_TABLET | Freq: Four times a day (QID) | ORAL | 0 refills | Status: DC | PRN

## 2016-08-02 NOTE — Care Management Important Message (Signed)
Important Message  Patient Details  Name: Elizabeth [name] MRN: [id number] Date of Birth: 1919/06/22   Medicare Important Message Given:  Yes    Arvid Marengo, Chrystine OilerSharley Diane, RN 08/02/2016, 8:24 AM

## 2016-08-02 NOTE — Progress Notes (Signed)
Pt's IV catheter removed and intact. Pt's IV site clean dry and intact. [facility]. All questions were answered and no further questions at this time. Pt in stable condition and in no acute distress at time of discharge. [facility] by nurse tech.

## 2016-08-02 NOTE — Clinical Social Work Placement (Signed)
   CLINICAL SOCIAL WORK PLACEMENT  NOTE  Date:  08/02/2016  Patient Details  Name: Elizabeth [name] MRN: [id number] Date of Birth: April 23, 1919  Clinical Social Work is seeking post-discharge placement for this patient at the Skilled  Nursing Facility level of care (*CSW will initial, date and re-position this form in  chart as items are completed):  Yes   Patient/family provided with [facility] Clinical Social Work Department's list of facilities offering this level of care within the geographic area requested by the patient (or if unable, by the patient's family).  Yes   Patient/family informed of their freedom to choose among providers that offer the needed level of care, that participate in Medicare, Medicaid or managed care program needed by the patient, have an available bed and are willing to accept the patient.  Yes   Patient/family informed of [facility]'s ownership interest in [facility] and [facility], as well as of the fact that they are under no obligation to receive care at these facilities.  PASRR submitted to EDS on 08/01/16     PASRR number received on 08/01/16     Existing PASRR number confirmed on       FL2 transmitted to all facilities in geographic area requested by pt/family on 08/01/16     FL2 transmitted to all facilities within larger geographic area on       Patient informed that his/her managed care company has contracts with or will negotiate with certain facilities, including the following:        Yes   Patient/family informed of bed offers received.  Patient chooses bed at [facility]     Physician recommends and patient chooses bed at      Patient to be [facility] on 08/02/16.  [facility]     Patient family notified on 08/02/16 of transfer.  Name of family member notified:  Mr. Weston Annallington, nephew     PHYSICIAN       Additional Comment: CSW notified  Kerri at facility via voicemail of patient's discharge. Patient's authorization details are: ID [id number], for three days starting 08/01/16, next review date is 08/03/16, Rug level is RVB.  Patient will need 536 minutes of therapy per week if tolerated. Updates should be sent to [facility] at [phone].   CSW signing off.    _______________________________________________ Annice NeedySettle, Annastasia Haskins D, LCSW 08/02/2016, 9:48 AM

## 2016-08-02 NOTE — Discharge Summary (Signed)
Physician Discharge Summary  Patient ID: Elizabeth [name] MRN: [id number] DOB/AGE: 20-Jan-1919 83 y.o. Primary Care Physician:Merced Brougham L, MD Admit date: 07/30/2016 Discharge date: 08/02/2016    Discharge Diagnoses:   Principal Problem:   Hypokalemia Active Problems:   HTN (hypertension), benign   Iron deficiency anemia due to chronic blood loss   Physical deconditioning   Generalized weakness Dehydration  Allergies as of 08/02/2016   No Known Allergies     Medication List    STOP taking these medications   amLODipine 5 MG tablet Commonly known as:  NORVASC   aspirin 325 MG tablet Replaced by:  aspirin EC 81 MG tablet   ciprofloxacin 500 MG tablet Commonly known as:  CIPRO   lansoprazole 30 MG capsule Commonly known as:  PREVACID   metroNIDAZOLE 500 MG tablet Commonly known as:  FLAGYL   quinapril 20 MG tablet Commonly known as:  ACCUPRIL     TAKE these medications   acetaminophen 325 MG tablet Commonly known as:  TYLENOL Take 2 tablets (650 mg total) by mouth every 6 (six) hours as needed for mild pain (or Fever >/= 101).   aspirin EC 81 MG tablet Take 1 tablet (81 mg total) by mouth daily. Replaces:  aspirin 325 MG tablet   atorvastatin 10 MG tablet Commonly known as:  LIPITOR Take 10 mg by mouth every morning.   bisacodyl 5 MG EC tablet Commonly known as:  DULCOLAX Take 1 tablet (5 mg total) by mouth daily as needed for moderate constipation.   Ferrous Fumarate 324 (106 Fe) MG Tabs tablet Commonly known as:  HEMOCYTE - 106 mg FE Take 1 tablet (106 mg of iron total) by mouth daily.   levothyroxine 75 MCG tablet Commonly known as:  SYNTHROID, LEVOTHROID Take 1 tablet (75 mcg total) by mouth daily before breakfast. What changed:  medication strength  how much to take  when to take this   omeprazole 20 MG capsule Commonly known as:  PRILOSEC Take 1 capsule (20 mg total) by mouth daily.   ondansetron 4 MG tablet Commonly known as:   ZOFRAN Take 1 tablet (4 mg total) by mouth every 6 (six) hours as needed for nausea.   polyethylene glycol packet Commonly known as:  MIRALAX / GLYCOLAX Take 17 g by mouth daily as needed for mild constipation.       Discharged Condition:Improved    Consults: None  Significant Diagnostic Studies: Ct Abdomen Pelvis Wo Contrast  Result Date: 07/21/2016 CLINICAL DATA:  Generalized abdominal pain. EXAM: CT ABDOMEN AND PELVIS WITHOUT CONTRAST TECHNIQUE: Multidetector CT imaging of the abdomen and pelvis was performed following the standard protocol without IV contrast. COMPARISON:  CT scan of October 18, 2014. FINDINGS: Lower chest: Large hiatal hernia is noted. Visualized lung bases are unremarkable. Hepatobiliary: No focal liver abnormality is seen. No gallstones, gallbladder wall thickening, or biliary dilatation. Pancreas: Unremarkable. No pancreatic ductal dilatation or surrounding inflammatory changes. Spleen: Normal in size without focal abnormality. Adrenals/Urinary Tract: Adrenal glands are unremarkable. Mild left renal atrophy is noted. No hydronephrosis or renal obstruction is noted. No renal or ureteral calculi are noted. Urinary bladder appears normal. Stomach/Bowel: There is no evidence of bowel obstruction. Inflammatory changes are seen involving the descending colon an sigmoid colon concerning for colitis. Vascular/Lymphatic: Aortic atherosclerosis. No enlarged abdominal or pelvic lymph nodes. Reproductive: Status post hysterectomy. No adnexal masses. Other: No abdominal wall hernia or abnormality. No abdominopelvic ascites. Musculoskeletal: Severe multilevel degenerative disc disease is noted in the lumbar  spine. IMPRESSION: Large hiatal hernia. Mild left renal atrophy. Aortic atherosclerosis. Inflammatory changes are seen surrounding the descending and sigmoid colon concerning for infectious or inflammatory colitis. Electronically Signed   By: Lupita RaiderJames  Green Jr, M.D.   On: 07/21/2016  21:15    Lab Results: Basic Metabolic Panel:  Recent Labs  62/95/2800/03/09 1124 07/30/16 2008 07/31/16 0512  NA 137 140 136  K 2.8* 3.1* 3.6  CL 105 105 109  CO2 23  --  23  GLUCOSE 155* 92 86  BUN 15 12 12   CREATININE 1.14* 1.00 0.98  CALCIUM 7.9*  --  7.1*  MG  --   --  2.1   Liver Function Tests:  Recent Labs  07/30/16 1124  AST 29  ALT 14  ALKPHOS 39  BILITOT 0.3  PROT 5.5*  ALBUMIN 2.5*     CBC:  Recent Labs  07/30/16 1124 07/30/16 2008  WBC 5.1  --   HGB 8.3* 8.2*  HCT 26.9* 24.0*  MCV 75.4*  --   PLT 159  --     No results found for this or any previous visit (from the past 240 hour(s)).   Hospital Course: This is a 83 year old who had been in the hospital with colitis and had been discharged home. She did not do well at home and had multiple falls. She was brought back to the emergency department found to be dehydrated and to have hypokalemia. Of these were treated. She was also found to be somewhat hypotensive. She had been on blood pressure medications and these have been held. By the time of discharge she had received IV fluids received potassium replacement her potassium is better kidney function was better dehydration seems to have resolved and she was back at her baseline. She is still very weak. It was recommended that she go to skilled care facility and that has been arranged  Discharge Exam: Blood pressure 121/82, pulse 85, temperature 98.1 F (36.7 C), temperature source Oral, resp. rate 18, height 5\' 6"  (1.676 m), weight 63.9 kg (140 lb 14 oz), SpO2 95 %. She is awake and alert. She is very hard of hearing. Her abdomen is soft. Chest is clear.  Disposition: To skilled care facility. She will need physical therapy, occupational therapy and speech as needed. She will be on a regular diet. Her blood pressure needs to be followed closely because I have stopped her blood pressure medications.  Discharge Instructions    Discharge to SNF when bed  available    Complete by:  As directed       Contact information for after-discharge care    Destination    [facility] .   Specialty:  Skilled Nursing Facility Contact information: [address] [place] [phone]              Signed: Fredirick MaudlinHAWKINS,Mackena Plummer L   08/02/2016, 8:09 AM

## 2016-08-02 NOTE — Progress Notes (Signed)
She seems to be doing much better. She is still very weak. Her blood pressure is better. Potassium level is better. She's ready for transfer to skilled care facility. Discussed with her nephew by phone.

## 2016-08-05 DIAGNOSIS — E860 Dehydration: Secondary | ICD-10-CM | POA: Diagnosis not present

## 2016-08-05 DIAGNOSIS — R627 Adult failure to thrive: Secondary | ICD-10-CM | POA: Diagnosis not present

## 2016-08-07 ENCOUNTER — Encounter (HOSPITAL_COMMUNITY)
Admission: RE | Admit: 2016-08-07 | Discharge: 2016-08-07 | Disposition: A | Discharge status: Home / Self Care | Payer: Medicare Other | Source: Skilled Nursing Facility | Attending: Pulmonary Disease | Admitting: Pulmonary Disease

## 2016-08-07 DIAGNOSIS — K219 Gastro-esophageal reflux disease without esophagitis: Secondary | ICD-10-CM | POA: Insufficient documentation

## 2016-08-07 DIAGNOSIS — I10 Essential (primary) hypertension: Secondary | ICD-10-CM | POA: Insufficient documentation

## 2016-08-07 DIAGNOSIS — D500 Iron deficiency anemia secondary to blood loss (chronic): Secondary | ICD-10-CM | POA: Insufficient documentation

## 2016-08-07 DIAGNOSIS — E876 Hypokalemia: Secondary | ICD-10-CM | POA: Insufficient documentation

## 2016-08-07 DIAGNOSIS — M6281 Muscle weakness (generalized): Secondary | ICD-10-CM | POA: Insufficient documentation

## 2016-08-07 DIAGNOSIS — R1312 Dysphagia, oropharyngeal phase: Secondary | ICD-10-CM | POA: Insufficient documentation

## 2016-08-07 LAB — CBC WITH DIFFERENTIAL/PLATELET
BASOS ABS: 0.1 10*3/uL (ref 0.0–0.1)
BASOS PCT: 1 %
Eosinophils Absolute: 0 10*3/uL (ref 0.0–0.7)
Eosinophils Relative: 1 %
HCT: 27.3 % — ABNORMAL LOW (ref 36.0–46.0)
HEMOGLOBIN: 8.5 g/dL — AB (ref 12.0–15.0)
Lymphocytes Relative: 17 %
Lymphs Abs: 0.8 10*3/uL (ref 0.7–4.0)
MCH: 23.8 pg — ABNORMAL LOW (ref 26.0–34.0)
MCHC: 31.1 g/dL (ref 30.0–36.0)
MCV: 76.5 fL — ABNORMAL LOW (ref 78.0–100.0)
MONO ABS: 0.6 10*3/uL (ref 0.1–1.0)
MONOS PCT: 12 %
NEUTROS ABS: 3.4 10*3/uL (ref 1.7–7.7)
NEUTROS PCT: 69 %
Platelets: 201 10*3/uL (ref 150–400)
RBC: 3.57 MIL/uL — ABNORMAL LOW (ref 3.87–5.11)
RDW: 23.8 % — AB (ref 11.5–15.5)
WBC: 4.9 10*3/uL (ref 4.0–10.5)

## 2016-08-07 NOTE — H&P (Signed)
NAMBaron Hamper:  Elizabeth [name], Elizabeth [name]                ACCOUNT NO.:  [id number]  MEDICAL RECORD NO.:  [id number]  LOCATION:  A314                          FACILITY:  [facility]  PHYSICIAN:  Daimien Patmon L. Juanetta GoslingHawkins, M.D.DATE OF BIRTH:  03/03/1919  DATE OF ADMISSION:  07/21/2016 DATE OF DISCHARGE:  01/03/2018LH                             HISTORY & PHYSICAL   This documentation of history and physical was performed on August 05, 2016, at the Skilled Care Facility.  When I entered the room at about 10:30, she was awake and alert but confused.  She had been in the hospital with hypokalemia, weakness, physical deconditioning, dehydration, and is known to have chronic iron deficiency from chronic blood loss.  She had been in the hospital with colitis.  This improved. She was better, was discharged home, but did not thrive at home and had several falls.  She came back to the emergency department at my direction, was found to have low potassium, appeared to be dehydrated, and she was given IV fluids, potassium replacement, and had physical therapy evaluation.  When she was evaluated by Physical Therapy, it was felt that she needed to be in rehab.  PAST MEDICAL HISTORY:  Her past medical history is positive for B12 deficiency, previous colitis, coronary disease, GERD, hyperlipidemia, hypertension, hypothyroidism.  PAST SURGICAL HISTORY:  Surgically, she has had a hysterectomy, appendectomy, bunionectomy, coronary artery bypass grafting, EGD with dilatation.  SOCIAL HISTORY:  She is a nonsmoker lifelong.  Does not use any alcohol or any illicit drugs.  MAR reviewed and medication list is correct.  FAMILY HISTORY:  Her family history is noncontributory at her advanced age.  PHYSICAL EXAMINATION:  CONSTITUTIONAL:  She is in no acute distress. Calm, comfortable, confused. EYES:  Pupils reactive.  EOMI. EARS/NOSE/MOUTH/THROAT:  Her mucous membranes are moist.  She is very hard of hearing.  RESPIRATORY:  Her lungs are  clear. RESPIRATORY:  Effort is normal. CARDIOVASCULAR:  Her heart sounds are normal with the exception of a soft systolic murmur about grade 2.  She does not have any edema. GASTROINTESTINAL:  Her abdomen is soft with no masses.  Bowel sounds are normal.  MUSCULOSKELETAL:  She is very weak in the extremities, upper and lower symmetrically. SKIN:  Warm and dry. NEUROLOGICAL:  She is confused today.  No focal abnormalities. PSYCHIATRIC:  She is pleasant and cooperates but confused.  ASSESSMENT:  She has multiple medical problems.  She is very deconditioned, and she needs further rehab.  This will be accomplished and then decision needs to be made about whether she can go back home or whether she needs to be in something like an assisted living facility.     Elizabeth [name] L. Juanetta GoslingHawkins, M.D.     ELH/MEDQ  D:  08/06/2016  T:  08/07/2016  Job:  [id number]

## 2016-08-09 ENCOUNTER — Ambulatory Visit: Payer: Medicare Other | Admitting: Gastroenterology

## 2016-08-12 ENCOUNTER — Encounter (HOSPITAL_COMMUNITY)
Admission: RE | Admit: 2016-08-12 | Discharge: 2016-08-12 | Disposition: A | Discharge status: Home / Self Care | Payer: Medicare Other | Source: Skilled Nursing Facility | Attending: Pulmonary Disease | Admitting: Pulmonary Disease

## 2016-08-12 DIAGNOSIS — M7989 Other specified soft tissue disorders: Secondary | ICD-10-CM | POA: Diagnosis not present

## 2016-08-12 DIAGNOSIS — M79641 Pain in right hand: Secondary | ICD-10-CM | POA: Diagnosis not present

## 2016-08-12 DIAGNOSIS — M25531 Pain in right wrist: Secondary | ICD-10-CM | POA: Diagnosis not present

## 2016-08-12 LAB — URIC ACID: URIC ACID, SERUM: 3.4 mg/dL (ref 2.3–6.6)

## 2016-08-23 DIAGNOSIS — K219 Gastro-esophageal reflux disease without esophagitis: Secondary | ICD-10-CM | POA: Diagnosis not present

## 2016-08-23 DIAGNOSIS — R279 Unspecified lack of coordination: Secondary | ICD-10-CM | POA: Diagnosis not present

## 2016-08-23 DIAGNOSIS — R1312 Dysphagia, oropharyngeal phase: Secondary | ICD-10-CM | POA: Diagnosis not present

## 2016-08-23 DIAGNOSIS — E876 Hypokalemia: Secondary | ICD-10-CM | POA: Diagnosis not present

## 2016-08-23 DIAGNOSIS — R262 Difficulty in walking, not elsewhere classified: Secondary | ICD-10-CM | POA: Diagnosis not present

## 2016-08-23 DIAGNOSIS — M6281 Muscle weakness (generalized): Secondary | ICD-10-CM | POA: Diagnosis not present

## 2016-08-23 DIAGNOSIS — D500 Iron deficiency anemia secondary to blood loss (chronic): Secondary | ICD-10-CM | POA: Diagnosis not present

## 2016-08-24 ENCOUNTER — Encounter (HOSPITAL_COMMUNITY)
Admission: AD | Admit: 2016-08-24 | Discharge: 2016-08-24 | Disposition: A | Discharge status: Home / Self Care | Payer: Medicare Other | Source: Skilled Nursing Facility | Attending: Pulmonary Disease | Admitting: Pulmonary Disease

## 2016-08-24 DIAGNOSIS — J101 Influenza due to other identified influenza virus with other respiratory manifestations: Secondary | ICD-10-CM | POA: Insufficient documentation

## 2016-08-25 LAB — INFLUENZA PANEL BY PCR (TYPE A & B)
INFLAPCR: POSITIVE — AB
INFLBPCR: NEGATIVE

## 2016-08-27 ENCOUNTER — Telehealth: Payer: Self-pay

## 2016-08-27 NOTE — Telephone Encounter (Signed)
pts nephew called (left a voicemail) he said the pt has an ov on Friday, but she currently has the flu. He wanted to know if it was ok to reschedule or was this an urgent appt?  I spoke with Liz BeachJohn Ellington, pt was just diagnosed with flu on Saturday and started on Tamiflu. He really doesn't think she should be brought out while she is feeling bad and could possibly spread the flu. He said she was feeling ok (other than the flu). She is eating well, they havent had any problems at the nursing home. He is not sure he really wants her to have any invasive procedures done at her age and she will have to pay a lot for them since she has been in the nursing him. Advised him I would let [facility]SL know and see if she has any recommendations as to when she wants the pt to be seen.  [name], pts nephew is requesting to cancel ov on Friday.  Routing to LSL.

## 2016-08-28 NOTE — Telephone Encounter (Signed)
[name], please reschedule pt.

## 2016-08-28 NOTE — Telephone Encounter (Signed)
Cancelled her appointment.

## 2016-08-28 NOTE — Telephone Encounter (Signed)
RESCHEDULED HER APPOINTMENT AND NOTIFIED FAMILY MEMBER

## 2016-08-28 NOTE — Telephone Encounter (Signed)
OK to have patient reschedule OV to 3-4 weeks from now.

## 2016-08-31 ENCOUNTER — Ambulatory Visit: Payer: Medicare Other | Admitting: Gastroenterology

## 2016-09-08 DIAGNOSIS — E860 Dehydration: Secondary | ICD-10-CM | POA: Diagnosis not present

## 2016-09-08 DIAGNOSIS — R627 Adult failure to thrive: Secondary | ICD-10-CM | POA: Diagnosis not present

## 2016-09-09 NOTE — Progress Notes (Signed)
This is documentation of my visit at the skilled care facility of 09/08/2016. When I entered she was sitting in a wheelchair and in no acute distress. She is mildly confused but she does recognize me. She has no new complaints. Denies chest pain nausea vomiting.  [facility]  Exam shows that she is awake and alert and confused sitting in a wheelchair in no distress. HEENT exam unremarkable. She is wearing glasses. She is hard of hearing. Her lungs are clear. Her heart is regular with a soft systolic heart murmur. Her abdomen is soft. No edema of the extremities  She has hypertension, hypothyroidism, chronic anemia, chronic weakness dementia. She is working with rehabilitation but I do not think she's going to be able to live unassisted

## 2016-09-11 ENCOUNTER — Encounter (HOSPITAL_COMMUNITY)
Admission: RE | Admit: 2016-09-11 | Discharge: 2016-09-11 | Disposition: A | Discharge status: Home / Self Care | Payer: Medicare Other | Source: Skilled Nursing Facility | Attending: Pulmonary Disease | Admitting: Pulmonary Disease

## 2016-09-11 DIAGNOSIS — J111 Influenza due to unidentified influenza virus with other respiratory manifestations: Secondary | ICD-10-CM | POA: Diagnosis not present

## 2016-09-11 DIAGNOSIS — E784 Other hyperlipidemia: Secondary | ICD-10-CM | POA: Diagnosis not present

## 2016-09-11 DIAGNOSIS — M6281 Muscle weakness (generalized): Secondary | ICD-10-CM | POA: Diagnosis not present

## 2016-09-11 DIAGNOSIS — I10 Essential (primary) hypertension: Secondary | ICD-10-CM | POA: Diagnosis not present

## 2016-09-11 DIAGNOSIS — N179 Acute kidney failure, unspecified: Secondary | ICD-10-CM | POA: Diagnosis not present

## 2016-09-11 DIAGNOSIS — R2681 Unsteadiness on feet: Secondary | ICD-10-CM | POA: Diagnosis not present

## 2016-09-11 DIAGNOSIS — K219 Gastro-esophageal reflux disease without esophagitis: Secondary | ICD-10-CM | POA: Diagnosis not present

## 2016-09-11 DIAGNOSIS — K523 Indeterminate colitis: Secondary | ICD-10-CM | POA: Diagnosis not present

## 2016-09-11 DIAGNOSIS — E039 Hypothyroidism, unspecified: Secondary | ICD-10-CM | POA: Diagnosis not present

## 2016-09-11 LAB — TSH: TSH: 0.342 u[IU]/mL — ABNORMAL LOW (ref 0.350–4.500)

## 2016-09-12 DIAGNOSIS — K523 Indeterminate colitis: Secondary | ICD-10-CM | POA: Diagnosis not present

## 2016-09-12 DIAGNOSIS — N179 Acute kidney failure, unspecified: Secondary | ICD-10-CM | POA: Diagnosis not present

## 2016-09-12 DIAGNOSIS — R2681 Unsteadiness on feet: Secondary | ICD-10-CM | POA: Diagnosis not present

## 2016-09-12 DIAGNOSIS — M6281 Muscle weakness (generalized): Secondary | ICD-10-CM | POA: Diagnosis not present

## 2016-09-13 DIAGNOSIS — Z0000 Encounter for general adult medical examination without abnormal findings: Secondary | ICD-10-CM | POA: Diagnosis not present

## 2016-09-18 ENCOUNTER — Ambulatory Visit: Payer: Medicare Other | Admitting: Internal Medicine

## 2016-09-19 DIAGNOSIS — E785 Hyperlipidemia, unspecified: Secondary | ICD-10-CM | POA: Diagnosis not present

## 2016-09-19 DIAGNOSIS — M6281 Muscle weakness (generalized): Secondary | ICD-10-CM | POA: Diagnosis not present

## 2016-09-19 DIAGNOSIS — D649 Anemia, unspecified: Secondary | ICD-10-CM | POA: Diagnosis not present

## 2016-09-19 DIAGNOSIS — H6120 Impacted cerumen, unspecified ear: Secondary | ICD-10-CM | POA: Diagnosis not present

## 2016-09-19 DIAGNOSIS — K219 Gastro-esophageal reflux disease without esophagitis: Secondary | ICD-10-CM | POA: Diagnosis not present

## 2016-09-19 DIAGNOSIS — I2510 Atherosclerotic heart disease of native coronary artery without angina pectoris: Secondary | ICD-10-CM | POA: Diagnosis not present

## 2016-09-19 DIAGNOSIS — R110 Nausea: Secondary | ICD-10-CM | POA: Diagnosis not present

## 2016-09-19 DIAGNOSIS — I10 Essential (primary) hypertension: Secondary | ICD-10-CM | POA: Diagnosis not present

## 2016-09-26 DIAGNOSIS — M79641 Pain in right hand: Secondary | ICD-10-CM | POA: Diagnosis not present

## 2016-09-26 DIAGNOSIS — R2231 Localized swelling, mass and lump, right upper limb: Secondary | ICD-10-CM | POA: Diagnosis not present

## 2016-10-03 DIAGNOSIS — M79641 Pain in right hand: Secondary | ICD-10-CM | POA: Diagnosis not present

## 2016-10-03 DIAGNOSIS — R2231 Localized swelling, mass and lump, right upper limb: Secondary | ICD-10-CM | POA: Diagnosis not present

## 2016-10-10 DIAGNOSIS — K523 Indeterminate colitis: Secondary | ICD-10-CM | POA: Diagnosis not present

## 2016-10-10 DIAGNOSIS — R2681 Unsteadiness on feet: Secondary | ICD-10-CM | POA: Diagnosis not present

## 2016-10-10 DIAGNOSIS — M6281 Muscle weakness (generalized): Secondary | ICD-10-CM | POA: Diagnosis not present

## 2016-10-10 DIAGNOSIS — N179 Acute kidney failure, unspecified: Secondary | ICD-10-CM | POA: Diagnosis not present

## 2016-10-15 DIAGNOSIS — H6120 Impacted cerumen, unspecified ear: Secondary | ICD-10-CM | POA: Diagnosis not present

## 2016-10-15 DIAGNOSIS — E785 Hyperlipidemia, unspecified: Secondary | ICD-10-CM | POA: Diagnosis not present

## 2016-10-15 DIAGNOSIS — I2510 Atherosclerotic heart disease of native coronary artery without angina pectoris: Secondary | ICD-10-CM | POA: Diagnosis not present

## 2016-10-15 DIAGNOSIS — R110 Nausea: Secondary | ICD-10-CM | POA: Diagnosis not present

## 2016-10-16 DIAGNOSIS — M79674 Pain in right toe(s): Secondary | ICD-10-CM | POA: Diagnosis not present

## 2016-10-16 DIAGNOSIS — B351 Tinea unguium: Secondary | ICD-10-CM | POA: Diagnosis not present

## 2016-10-16 DIAGNOSIS — M79675 Pain in left toe(s): Secondary | ICD-10-CM | POA: Diagnosis not present

## 2016-10-17 DIAGNOSIS — E039 Hypothyroidism, unspecified: Secondary | ICD-10-CM | POA: Diagnosis not present

## 2016-10-17 DIAGNOSIS — K219 Gastro-esophageal reflux disease without esophagitis: Secondary | ICD-10-CM | POA: Diagnosis not present

## 2016-10-17 DIAGNOSIS — E785 Hyperlipidemia, unspecified: Secondary | ICD-10-CM | POA: Diagnosis not present

## 2016-10-17 DIAGNOSIS — D649 Anemia, unspecified: Secondary | ICD-10-CM | POA: Diagnosis not present

## 2016-10-17 DIAGNOSIS — M6281 Muscle weakness (generalized): Secondary | ICD-10-CM | POA: Diagnosis not present

## 2016-10-17 DIAGNOSIS — I10 Essential (primary) hypertension: Secondary | ICD-10-CM | POA: Diagnosis not present

## 2016-10-24 DIAGNOSIS — R2231 Localized swelling, mass and lump, right upper limb: Secondary | ICD-10-CM | POA: Diagnosis not present

## 2016-10-24 DIAGNOSIS — K219 Gastro-esophageal reflux disease without esophagitis: Secondary | ICD-10-CM | POA: Diagnosis not present

## 2016-10-24 DIAGNOSIS — M79641 Pain in right hand: Secondary | ICD-10-CM | POA: Diagnosis not present

## 2016-11-10 DIAGNOSIS — R2681 Unsteadiness on feet: Secondary | ICD-10-CM | POA: Diagnosis not present

## 2016-11-10 DIAGNOSIS — M6281 Muscle weakness (generalized): Secondary | ICD-10-CM | POA: Diagnosis not present

## 2016-11-10 DIAGNOSIS — K523 Indeterminate colitis: Secondary | ICD-10-CM | POA: Diagnosis not present

## 2016-11-10 DIAGNOSIS — N179 Acute kidney failure, unspecified: Secondary | ICD-10-CM | POA: Diagnosis not present

## 2016-11-14 DIAGNOSIS — E039 Hypothyroidism, unspecified: Secondary | ICD-10-CM | POA: Diagnosis not present

## 2016-11-14 DIAGNOSIS — D649 Anemia, unspecified: Secondary | ICD-10-CM | POA: Diagnosis not present

## 2016-11-14 DIAGNOSIS — K219 Gastro-esophageal reflux disease without esophagitis: Secondary | ICD-10-CM | POA: Diagnosis not present

## 2016-11-14 DIAGNOSIS — K5900 Constipation, unspecified: Secondary | ICD-10-CM | POA: Diagnosis not present

## 2016-11-21 DIAGNOSIS — E039 Hypothyroidism, unspecified: Secondary | ICD-10-CM | POA: Diagnosis not present

## 2016-11-28 DIAGNOSIS — E039 Hypothyroidism, unspecified: Secondary | ICD-10-CM | POA: Diagnosis not present

## 2016-12-10 DIAGNOSIS — M6281 Muscle weakness (generalized): Secondary | ICD-10-CM | POA: Diagnosis not present

## 2016-12-10 DIAGNOSIS — R2681 Unsteadiness on feet: Secondary | ICD-10-CM | POA: Diagnosis not present

## 2016-12-10 DIAGNOSIS — N179 Acute kidney failure, unspecified: Secondary | ICD-10-CM | POA: Diagnosis not present

## 2016-12-10 DIAGNOSIS — K523 Indeterminate colitis: Secondary | ICD-10-CM | POA: Diagnosis not present

## 2016-12-12 DIAGNOSIS — I2510 Atherosclerotic heart disease of native coronary artery without angina pectoris: Secondary | ICD-10-CM | POA: Diagnosis not present

## 2016-12-12 DIAGNOSIS — D649 Anemia, unspecified: Secondary | ICD-10-CM | POA: Diagnosis not present

## 2016-12-12 DIAGNOSIS — E039 Hypothyroidism, unspecified: Secondary | ICD-10-CM | POA: Diagnosis not present

## 2016-12-12 DIAGNOSIS — I499 Cardiac arrhythmia, unspecified: Secondary | ICD-10-CM | POA: Diagnosis not present

## 2016-12-19 DIAGNOSIS — K219 Gastro-esophageal reflux disease without esophagitis: Secondary | ICD-10-CM | POA: Diagnosis not present

## 2016-12-19 DIAGNOSIS — I499 Cardiac arrhythmia, unspecified: Secondary | ICD-10-CM | POA: Diagnosis not present

## 2016-12-19 DIAGNOSIS — R4182 Altered mental status, unspecified: Secondary | ICD-10-CM | POA: Diagnosis not present

## 2016-12-19 DIAGNOSIS — D649 Anemia, unspecified: Secondary | ICD-10-CM | POA: Diagnosis not present

## 2016-12-19 DIAGNOSIS — M6281 Muscle weakness (generalized): Secondary | ICD-10-CM | POA: Diagnosis not present

## 2016-12-19 DIAGNOSIS — I10 Essential (primary) hypertension: Secondary | ICD-10-CM | POA: Diagnosis not present

## 2016-12-21 DIAGNOSIS — N390 Urinary tract infection, site not specified: Secondary | ICD-10-CM | POA: Diagnosis not present

## 2017-01-01 DIAGNOSIS — M79675 Pain in left toe(s): Secondary | ICD-10-CM | POA: Diagnosis not present

## 2017-01-01 DIAGNOSIS — M79674 Pain in right toe(s): Secondary | ICD-10-CM | POA: Diagnosis not present

## 2017-01-01 DIAGNOSIS — B351 Tinea unguium: Secondary | ICD-10-CM | POA: Diagnosis not present

## 2017-01-02 DIAGNOSIS — K219 Gastro-esophageal reflux disease without esophagitis: Secondary | ICD-10-CM | POA: Diagnosis not present

## 2017-01-02 DIAGNOSIS — I499 Cardiac arrhythmia, unspecified: Secondary | ICD-10-CM | POA: Diagnosis not present

## 2017-01-02 DIAGNOSIS — E785 Hyperlipidemia, unspecified: Secondary | ICD-10-CM | POA: Diagnosis not present

## 2017-01-02 DIAGNOSIS — K5900 Constipation, unspecified: Secondary | ICD-10-CM | POA: Diagnosis not present

## 2017-01-10 DIAGNOSIS — N179 Acute kidney failure, unspecified: Secondary | ICD-10-CM | POA: Diagnosis not present

## 2017-01-10 DIAGNOSIS — M6281 Muscle weakness (generalized): Secondary | ICD-10-CM | POA: Diagnosis not present

## 2017-01-10 DIAGNOSIS — R2681 Unsteadiness on feet: Secondary | ICD-10-CM | POA: Diagnosis not present

## 2017-01-10 DIAGNOSIS — K523 Indeterminate colitis: Secondary | ICD-10-CM | POA: Diagnosis not present

## 2017-01-17 DIAGNOSIS — M1990 Unspecified osteoarthritis, unspecified site: Secondary | ICD-10-CM | POA: Diagnosis not present

## 2017-01-17 DIAGNOSIS — M6281 Muscle weakness (generalized): Secondary | ICD-10-CM | POA: Diagnosis not present

## 2017-01-17 DIAGNOSIS — D649 Anemia, unspecified: Secondary | ICD-10-CM | POA: Diagnosis not present

## 2017-01-17 DIAGNOSIS — K219 Gastro-esophageal reflux disease without esophagitis: Secondary | ICD-10-CM | POA: Diagnosis not present

## 2017-02-04 DIAGNOSIS — E039 Hypothyroidism, unspecified: Secondary | ICD-10-CM | POA: Diagnosis not present

## 2017-02-04 DIAGNOSIS — I2510 Atherosclerotic heart disease of native coronary artery without angina pectoris: Secondary | ICD-10-CM | POA: Diagnosis not present

## 2017-02-04 DIAGNOSIS — D649 Anemia, unspecified: Secondary | ICD-10-CM | POA: Diagnosis not present

## 2017-02-18 DIAGNOSIS — M6281 Muscle weakness (generalized): Secondary | ICD-10-CM | POA: Diagnosis not present

## 2017-02-18 DIAGNOSIS — D649 Anemia, unspecified: Secondary | ICD-10-CM | POA: Diagnosis not present

## 2017-02-18 DIAGNOSIS — K5900 Constipation, unspecified: Secondary | ICD-10-CM | POA: Diagnosis not present

## 2017-02-18 DIAGNOSIS — K219 Gastro-esophageal reflux disease without esophagitis: Secondary | ICD-10-CM | POA: Diagnosis not present

## 2017-03-11 DIAGNOSIS — K219 Gastro-esophageal reflux disease without esophagitis: Secondary | ICD-10-CM | POA: Diagnosis not present

## 2017-03-11 DIAGNOSIS — M1990 Unspecified osteoarthritis, unspecified site: Secondary | ICD-10-CM | POA: Diagnosis not present

## 2017-03-11 DIAGNOSIS — E039 Hypothyroidism, unspecified: Secondary | ICD-10-CM | POA: Diagnosis not present

## 2017-03-27 DIAGNOSIS — M1990 Unspecified osteoarthritis, unspecified site: Secondary | ICD-10-CM | POA: Diagnosis not present

## 2017-03-27 DIAGNOSIS — K219 Gastro-esophageal reflux disease without esophagitis: Secondary | ICD-10-CM | POA: Diagnosis not present

## 2017-03-27 DIAGNOSIS — M79674 Pain in right toe(s): Secondary | ICD-10-CM | POA: Diagnosis not present

## 2017-03-27 DIAGNOSIS — M79675 Pain in left toe(s): Secondary | ICD-10-CM | POA: Diagnosis not present

## 2017-03-27 DIAGNOSIS — E039 Hypothyroidism, unspecified: Secondary | ICD-10-CM | POA: Diagnosis not present

## 2017-03-27 DIAGNOSIS — B351 Tinea unguium: Secondary | ICD-10-CM | POA: Diagnosis not present

## 2017-04-08 DIAGNOSIS — I499 Cardiac arrhythmia, unspecified: Secondary | ICD-10-CM | POA: Diagnosis not present

## 2017-04-08 DIAGNOSIS — E039 Hypothyroidism, unspecified: Secondary | ICD-10-CM | POA: Diagnosis not present

## 2017-04-08 DIAGNOSIS — K5900 Constipation, unspecified: Secondary | ICD-10-CM | POA: Diagnosis not present

## 2017-04-08 DIAGNOSIS — H6120 Impacted cerumen, unspecified ear: Secondary | ICD-10-CM | POA: Diagnosis not present

## 2017-04-17 DIAGNOSIS — K219 Gastro-esophageal reflux disease without esophagitis: Secondary | ICD-10-CM | POA: Diagnosis not present

## 2017-04-17 DIAGNOSIS — D649 Anemia, unspecified: Secondary | ICD-10-CM | POA: Diagnosis not present

## 2017-04-17 DIAGNOSIS — M6281 Muscle weakness (generalized): Secondary | ICD-10-CM | POA: Diagnosis not present

## 2017-04-17 DIAGNOSIS — E039 Hypothyroidism, unspecified: Secondary | ICD-10-CM | POA: Diagnosis not present

## 2017-05-06 DIAGNOSIS — K5900 Constipation, unspecified: Secondary | ICD-10-CM | POA: Diagnosis not present

## 2017-05-06 DIAGNOSIS — D649 Anemia, unspecified: Secondary | ICD-10-CM | POA: Diagnosis not present

## 2017-05-06 DIAGNOSIS — E785 Hyperlipidemia, unspecified: Secondary | ICD-10-CM | POA: Diagnosis not present

## 2017-05-06 DIAGNOSIS — I2510 Atherosclerotic heart disease of native coronary artery without angina pectoris: Secondary | ICD-10-CM | POA: Diagnosis not present

## 2017-06-10 DIAGNOSIS — M1990 Unspecified osteoarthritis, unspecified site: Secondary | ICD-10-CM | POA: Diagnosis not present

## 2017-06-10 DIAGNOSIS — E039 Hypothyroidism, unspecified: Secondary | ICD-10-CM | POA: Diagnosis not present

## 2017-06-10 DIAGNOSIS — I2510 Atherosclerotic heart disease of native coronary artery without angina pectoris: Secondary | ICD-10-CM | POA: Diagnosis not present

## 2017-06-10 DIAGNOSIS — K219 Gastro-esophageal reflux disease without esophagitis: Secondary | ICD-10-CM | POA: Diagnosis not present

## 2017-06-11 DIAGNOSIS — M79674 Pain in right toe(s): Secondary | ICD-10-CM | POA: Diagnosis not present

## 2017-06-11 DIAGNOSIS — M79675 Pain in left toe(s): Secondary | ICD-10-CM | POA: Diagnosis not present

## 2017-06-11 DIAGNOSIS — B351 Tinea unguium: Secondary | ICD-10-CM | POA: Diagnosis not present

## 2017-07-15 DIAGNOSIS — D649 Anemia, unspecified: Secondary | ICD-10-CM | POA: Diagnosis not present

## 2017-07-15 DIAGNOSIS — I499 Cardiac arrhythmia, unspecified: Secondary | ICD-10-CM | POA: Diagnosis not present

## 2017-07-15 DIAGNOSIS — K219 Gastro-esophageal reflux disease without esophagitis: Secondary | ICD-10-CM | POA: Diagnosis not present

## 2017-07-15 DIAGNOSIS — K5900 Constipation, unspecified: Secondary | ICD-10-CM | POA: Diagnosis not present

## 2017-08-19 DIAGNOSIS — H6120 Impacted cerumen, unspecified ear: Secondary | ICD-10-CM | POA: Diagnosis not present

## 2017-08-19 DIAGNOSIS — I2510 Atherosclerotic heart disease of native coronary artery without angina pectoris: Secondary | ICD-10-CM | POA: Diagnosis not present

## 2017-08-19 DIAGNOSIS — K219 Gastro-esophageal reflux disease without esophagitis: Secondary | ICD-10-CM | POA: Diagnosis not present

## 2017-08-19 DIAGNOSIS — I499 Cardiac arrhythmia, unspecified: Secondary | ICD-10-CM | POA: Diagnosis not present

## 2017-08-20 DIAGNOSIS — L821 Other seborrheic keratosis: Secondary | ICD-10-CM | POA: Diagnosis not present

## 2017-08-28 DIAGNOSIS — D649 Anemia, unspecified: Secondary | ICD-10-CM | POA: Diagnosis not present

## 2017-08-28 DIAGNOSIS — E039 Hypothyroidism, unspecified: Secondary | ICD-10-CM | POA: Diagnosis not present

## 2017-09-05 DIAGNOSIS — M79674 Pain in right toe(s): Secondary | ICD-10-CM | POA: Diagnosis not present

## 2017-09-05 DIAGNOSIS — B351 Tinea unguium: Secondary | ICD-10-CM | POA: Diagnosis not present

## 2017-09-05 DIAGNOSIS — M79675 Pain in left toe(s): Secondary | ICD-10-CM | POA: Diagnosis not present

## 2017-09-16 DIAGNOSIS — I2510 Atherosclerotic heart disease of native coronary artery without angina pectoris: Secondary | ICD-10-CM | POA: Diagnosis not present

## 2017-09-16 DIAGNOSIS — H6120 Impacted cerumen, unspecified ear: Secondary | ICD-10-CM | POA: Diagnosis not present

## 2017-09-16 DIAGNOSIS — I499 Cardiac arrhythmia, unspecified: Secondary | ICD-10-CM | POA: Diagnosis not present

## 2017-09-16 DIAGNOSIS — K219 Gastro-esophageal reflux disease without esophagitis: Secondary | ICD-10-CM | POA: Diagnosis not present

## 2017-09-30 DIAGNOSIS — Z20828 Contact with and (suspected) exposure to other viral communicable diseases: Secondary | ICD-10-CM | POA: Diagnosis not present

## 2017-10-21 DIAGNOSIS — I2510 Atherosclerotic heart disease of native coronary artery without angina pectoris: Secondary | ICD-10-CM | POA: Diagnosis not present

## 2017-10-21 DIAGNOSIS — I499 Cardiac arrhythmia, unspecified: Secondary | ICD-10-CM | POA: Diagnosis not present

## 2017-10-21 DIAGNOSIS — M1990 Unspecified osteoarthritis, unspecified site: Secondary | ICD-10-CM | POA: Diagnosis not present

## 2017-10-21 DIAGNOSIS — E039 Hypothyroidism, unspecified: Secondary | ICD-10-CM | POA: Diagnosis not present

## 2017-11-21 DIAGNOSIS — J811 Chronic pulmonary edema: Secondary | ICD-10-CM | POA: Diagnosis not present

## 2017-11-28 DIAGNOSIS — M79675 Pain in left toe(s): Secondary | ICD-10-CM | POA: Diagnosis not present

## 2017-11-28 DIAGNOSIS — M79674 Pain in right toe(s): Secondary | ICD-10-CM | POA: Diagnosis not present

## 2017-11-28 DIAGNOSIS — B351 Tinea unguium: Secondary | ICD-10-CM | POA: Diagnosis not present

## 2017-12-23 DIAGNOSIS — J811 Chronic pulmonary edema: Secondary | ICD-10-CM | POA: Diagnosis not present

## 2018-01-08 DIAGNOSIS — E559 Vitamin D deficiency, unspecified: Secondary | ICD-10-CM | POA: Diagnosis not present

## 2018-01-08 DIAGNOSIS — E782 Mixed hyperlipidemia: Secondary | ICD-10-CM | POA: Diagnosis not present

## 2018-01-08 DIAGNOSIS — E039 Hypothyroidism, unspecified: Secondary | ICD-10-CM | POA: Diagnosis not present

## 2018-01-08 DIAGNOSIS — I10 Essential (primary) hypertension: Secondary | ICD-10-CM | POA: Diagnosis not present

## 2018-02-13 DIAGNOSIS — M79675 Pain in left toe(s): Secondary | ICD-10-CM | POA: Diagnosis not present

## 2018-02-13 DIAGNOSIS — M79674 Pain in right toe(s): Secondary | ICD-10-CM | POA: Diagnosis not present

## 2018-02-13 DIAGNOSIS — B351 Tinea unguium: Secondary | ICD-10-CM | POA: Diagnosis not present

## 2018-04-09 DIAGNOSIS — E559 Vitamin D deficiency, unspecified: Secondary | ICD-10-CM | POA: Diagnosis not present

## 2018-04-09 DIAGNOSIS — E039 Hypothyroidism, unspecified: Secondary | ICD-10-CM | POA: Diagnosis not present

## 2018-04-09 DIAGNOSIS — D649 Anemia, unspecified: Secondary | ICD-10-CM | POA: Diagnosis not present

## 2018-05-08 DIAGNOSIS — M79674 Pain in right toe(s): Secondary | ICD-10-CM | POA: Diagnosis not present

## 2018-05-08 DIAGNOSIS — M79675 Pain in left toe(s): Secondary | ICD-10-CM | POA: Diagnosis not present

## 2018-05-08 DIAGNOSIS — B351 Tinea unguium: Secondary | ICD-10-CM | POA: Diagnosis not present

## 2018-05-28 DIAGNOSIS — E559 Vitamin D deficiency, unspecified: Secondary | ICD-10-CM | POA: Diagnosis not present

## 2018-05-28 DIAGNOSIS — K219 Gastro-esophageal reflux disease without esophagitis: Secondary | ICD-10-CM | POA: Diagnosis not present

## 2018-05-28 DIAGNOSIS — E039 Hypothyroidism, unspecified: Secondary | ICD-10-CM | POA: Diagnosis not present

## 2018-05-28 DIAGNOSIS — K5900 Constipation, unspecified: Secondary | ICD-10-CM | POA: Diagnosis not present

## 2018-07-09 DIAGNOSIS — I10 Essential (primary) hypertension: Secondary | ICD-10-CM | POA: Diagnosis not present

## 2018-07-09 DIAGNOSIS — E559 Vitamin D deficiency, unspecified: Secondary | ICD-10-CM | POA: Diagnosis not present

## 2018-07-09 DIAGNOSIS — E039 Hypothyroidism, unspecified: Secondary | ICD-10-CM | POA: Diagnosis not present

## 2018-07-09 DIAGNOSIS — E782 Mixed hyperlipidemia: Secondary | ICD-10-CM | POA: Diagnosis not present

## 2018-07-31 DIAGNOSIS — M79674 Pain in right toe(s): Secondary | ICD-10-CM | POA: Diagnosis not present

## 2018-07-31 DIAGNOSIS — M79675 Pain in left toe(s): Secondary | ICD-10-CM | POA: Diagnosis not present

## 2018-07-31 DIAGNOSIS — B351 Tinea unguium: Secondary | ICD-10-CM | POA: Diagnosis not present

## 2018-08-04 ENCOUNTER — Emergency Department (HOSPITAL_COMMUNITY): Payer: Medicare Other

## 2018-08-04 ENCOUNTER — Encounter (HOSPITAL_COMMUNITY): Payer: Self-pay

## 2018-08-04 ENCOUNTER — Other Ambulatory Visit: Payer: Self-pay

## 2018-08-04 ENCOUNTER — Emergency Department (HOSPITAL_COMMUNITY)
Admission: EM | Admit: 2018-08-04 | Discharge: 2018-08-04 | Disposition: A | Discharge status: Home / Self Care | Payer: Medicare Other | Attending: Emergency Medicine | Admitting: Emergency Medicine

## 2018-08-04 DIAGNOSIS — R0902 Hypoxemia: Secondary | ICD-10-CM | POA: Diagnosis not present

## 2018-08-04 DIAGNOSIS — R58 Hemorrhage, not elsewhere classified: Secondary | ICD-10-CM | POA: Diagnosis not present

## 2018-08-04 DIAGNOSIS — Y999 Unspecified external cause status: Secondary | ICD-10-CM | POA: Diagnosis not present

## 2018-08-04 DIAGNOSIS — W0110XA Fall on same level from slipping, tripping and stumbling with subsequent striking against unspecified object, initial encounter: Secondary | ICD-10-CM | POA: Insufficient documentation

## 2018-08-04 DIAGNOSIS — S199XXA Unspecified injury of neck, initial encounter: Secondary | ICD-10-CM | POA: Diagnosis not present

## 2018-08-04 DIAGNOSIS — I2510 Atherosclerotic heart disease of native coronary artery without angina pectoris: Secondary | ICD-10-CM | POA: Insufficient documentation

## 2018-08-04 DIAGNOSIS — Z23 Encounter for immunization: Secondary | ICD-10-CM | POA: Insufficient documentation

## 2018-08-04 DIAGNOSIS — S0990XA Unspecified injury of head, initial encounter: Secondary | ICD-10-CM | POA: Diagnosis not present

## 2018-08-04 DIAGNOSIS — Y9289 Other specified places as the place of occurrence of the external cause: Secondary | ICD-10-CM | POA: Insufficient documentation

## 2018-08-04 DIAGNOSIS — Y939 Activity, unspecified: Secondary | ICD-10-CM | POA: Insufficient documentation

## 2018-08-04 DIAGNOSIS — S81802A Unspecified open wound, left lower leg, initial encounter: Secondary | ICD-10-CM | POA: Diagnosis not present

## 2018-08-04 DIAGNOSIS — I10 Essential (primary) hypertension: Secondary | ICD-10-CM | POA: Diagnosis not present

## 2018-08-04 DIAGNOSIS — W19XXXA Unspecified fall, initial encounter: Secondary | ICD-10-CM | POA: Diagnosis not present

## 2018-08-04 DIAGNOSIS — E039 Hypothyroidism, unspecified: Secondary | ICD-10-CM | POA: Diagnosis not present

## 2018-08-04 DIAGNOSIS — S8992XA Unspecified injury of left lower leg, initial encounter: Secondary | ICD-10-CM | POA: Diagnosis not present

## 2018-08-04 DIAGNOSIS — R531 Weakness: Secondary | ICD-10-CM | POA: Diagnosis not present

## 2018-08-04 DIAGNOSIS — S81812A Laceration without foreign body, left lower leg, initial encounter: Secondary | ICD-10-CM | POA: Diagnosis not present

## 2018-08-04 DIAGNOSIS — M79662 Pain in left lower leg: Secondary | ICD-10-CM | POA: Diagnosis not present

## 2018-08-04 DIAGNOSIS — S299XXA Unspecified injury of thorax, initial encounter: Secondary | ICD-10-CM | POA: Diagnosis not present

## 2018-08-04 DIAGNOSIS — R42 Dizziness and giddiness: Secondary | ICD-10-CM | POA: Diagnosis not present

## 2018-08-04 HISTORY — DX: Unspecified osteoarthritis, unspecified site: M19.90

## 2018-08-04 HISTORY — DX: Anemia, unspecified: D64.9

## 2018-08-04 LAB — CBC WITH DIFFERENTIAL/PLATELET
Abs Immature Granulocytes: 0.05 10*3/uL (ref 0.00–0.07)
BASOS PCT: 0 %
Basophils Absolute: 0 10*3/uL (ref 0.0–0.1)
EOS ABS: 0.1 10*3/uL (ref 0.0–0.5)
EOS PCT: 1 %
HEMATOCRIT: 44.8 % (ref 36.0–46.0)
Hemoglobin: 14.4 g/dL (ref 12.0–15.0)
IMMATURE GRANULOCYTES: 1 %
LYMPHS ABS: 1.3 10*3/uL (ref 0.7–4.0)
Lymphocytes Relative: 21 %
MCH: 29.4 pg (ref 26.0–34.0)
MCHC: 32.1 g/dL (ref 30.0–36.0)
MCV: 91.4 fL (ref 80.0–100.0)
MONO ABS: 0.4 10*3/uL (ref 0.1–1.0)
MONOS PCT: 7 %
Neutro Abs: 4.6 10*3/uL (ref 1.7–7.7)
Neutrophils Relative %: 70 %
PLATELETS: 94 10*3/uL — AB (ref 150–400)
RBC: 4.9 MIL/uL (ref 3.87–5.11)
RDW: 16.5 % — ABNORMAL HIGH (ref 11.5–15.5)
WBC: 6.5 10*3/uL (ref 4.0–10.5)
nRBC: 0 % (ref 0.0–0.2)

## 2018-08-04 LAB — BASIC METABOLIC PANEL
Anion gap: 9 (ref 5–15)
BUN: 31 mg/dL — AB (ref 8–23)
CALCIUM: 7.5 mg/dL — AB (ref 8.9–10.3)
CO2: 22 mmol/L (ref 22–32)
CREATININE: 1.31 mg/dL — AB (ref 0.44–1.00)
Chloride: 105 mmol/L (ref 98–111)
GFR calc Af Amer: 39 mL/min — ABNORMAL LOW (ref >60)
GFR calc non Af Amer: 34 mL/min — ABNORMAL LOW (ref >60)
GLUCOSE: 94 mg/dL (ref 70–99)
POTASSIUM: 4 mmol/L (ref 3.5–5.1)
Sodium: 136 mmol/L (ref 135–145)

## 2018-08-04 LAB — TROPONIN I: Troponin I: <0.03 ng/mL (ref <0.03)

## 2018-08-04 MED ORDER — TETANUS-DIPHTH-ACELL PERTUSSIS 5-2.5-18.5 LF-MCG/0.5 IM SUSP
0.5000 mL | Freq: Once | INTRAMUSCULAR | Status: AC
  Administered 2018-08-04: 0.5 mL via INTRAMUSCULAR
  Filled 2018-08-04: qty 0.5

## 2018-08-04 NOTE — ED Notes (Signed)
Patient given meal tray.

## 2018-08-04 NOTE — Discharge Instructions (Signed)
You were seen in the ED today after a fall. Your CT scans, EKG, and labs were unremarkable. Return to the ED immediately with any trouble breathing. You have a skin tear on the left leg. Change this dressing every 24 hours for the next 5 days in a wet-to-dry fashion. Have your PCP see you in 5 days for a wound re-check. Return to the ED with any new or worsening symptoms.

## 2018-08-04 NOTE — ED Triage Notes (Signed)
EMS reports pt is a resident of [facility].  Reports pt c/o feeling dizzy after falling at the facility.  No one reports any LOC.  Pt says she fell and hit head on the floor.  Skin tear to left lower leg.  Pt says she felt fine before the fall.  Pt unsure why she fell.

## 2018-08-04 NOTE — ED Provider Notes (Signed)
Emergency Department Provider Note   I have reviewed the triage vital signs and the nursing notes.   HISTORY  Chief Complaint Fall   HPI Elizabeth [name] is a 85 y.o. female with PMH of CAD, GERD, HTN, HLD, and anemia [facility].  Patient tells me "I just fell and I can't tell you any more than that."  He denies to me feeling lightheaded or dizzy either before or after the fall.  According to EMS patient did complain of some dizziness after falling.  Patient states she did strike the back of her head on the right side during the fall.  She denies any loss of consciousness.  Paramedics did notice a skin tear to the left lower leg.  She denies any chest pain, heart palpitations, shortness of breath. No new medications.    Past Medical History:  Diagnosis Date  . Anemia   . Arthritis   . B12 deficiency   . C. difficile colitis Nov 2014   Treated with Flagyl  . Coronary artery disease   . GERD (gastroesophageal reflux disease)   . Hypercholesterolemia   . Hyperlipidemia   . Hypertension   . Hypothyroidism   . Osteoporosis   . Thyroid goiter    s/p surgery now on Synthroid  . Zenker diverticulum     Patient Active Problem List   Diagnosis Date Noted  . Hypokalemia 07/30/2016  . Physical deconditioning 07/30/2016  . Generalized weakness   . Iron deficiency anemia due to chronic blood loss   . Rectal bleeding 10/16/2014  . Anemia 10/16/2014  . Oropharyngeal dysphagia 11/02/2013  . Clostridium difficile colitis 06/01/2013  . Acute colitis 05/29/2013  . Hematochezia 05/29/2013  . HTN (hypertension), benign 05/29/2013  . Hypothyroidism 05/29/2013  . Hypotension, unspecified 05/29/2013  . ARF (acute renal failure) (HCC) 05/29/2013  . Anemia due to acute blood loss 05/29/2013  . Ischemic colitis (HCC) 09/02/2012  . Colitis 07/21/2012  . Viral syndrome 07/19/2012  . CAP (community acquired pneumonia), possibly  07/19/2012  . Dehydration 07/19/2012  . Diarrhea 07/19/2012  . Hyponatremia 07/19/2012  . GERD 02/27/2010  . Dysphagia 02/27/2010  . ESOPHAGEAL STRICTURE 11/30/2008    Past Surgical History:  Procedure Laterality Date  . ABDOMINAL HYSTERECTOMY    . APPENDECTOMY    . BUNIONECTOMY    . CORONARY ARTERY BYPASS GRAFT     Status Post four-vessel   . ESOPHAGEAL DILATION    . ESOPHAGOGASTRODUODENOSCOPY  6/10    Schatzki's ring, otherwise endoscopically normal esophagus, status post dilation (80F), with mucosal disruption through the cervical segment as well as the Schatzki's ring. Hiatal hernia. segment as well as the Schatzki's ring. Hiatal hernia.  Marland Kitchen. ESOPHAGOGASTRODUODENOSCOPY  09/02/2007   Cervical esophageal web, Schatzki's ring, status post  dilation and disruption as described above. Otherwise, normal esophagus  . ESOPHAGOGASTRODUODENOSCOPY  August 2011   Zenker's diverticulum, Schatzki's ring s/p dilation, mod to large HH, instructions to pursue BPE if recurrent dysphagia to assess Zenker's  . ESOPHAGOGASTRODUODENOSCOPY  03/12/12   Rourk-dilated 14F, cervical web, Schatzki's ring, small HH, small Zenker's diverticulum  . ESOPHAGOGASTRODUODENOSCOPY N/A 11/26/2013   Dr. Jena Gaussourk: Zenker's diverticulum, cervical esophageal web and Schatzi's ring s/p dilation, large hiatal hernia, consider ENT referral if persistent dysphagia.   Marland Kitchen. MALONEY DILATION N/A 11/26/2013   Procedure: Elease HashimotoMALONEY DILATION;  Surgeon: Corbin Adeobert M Rourk, MD;  Location: [facility];  Service: Endoscopy;  Laterality: N/A;   Allergies Patient has  no known allergies.  Family History  Problem Relation Age of Onset  . Colon cancer Neg Hx     Social History Social History   Tobacco Use  . Smoking status: Never Smoker  . Smokeless tobacco: Never Used  Substance Use Topics  . Alcohol use: No  . Drug use: No    Review of Systems  Constitutional: No fever/chills Eyes: No visual changes. ENT: No sore  throat. Cardiovascular: Denies chest pain. Respiratory: Denies shortness of breath. Gastrointestinal: No abdominal pain.  No nausea, no vomiting.  No diarrhea.  No constipation. Genitourinary: Negative for dysuria. Musculoskeletal: Negative for back pain. Skin: Skin tare left lower leg.  Neurological: Negative for focal weakness or numbness. Positive HA.   10-point ROS otherwise negative.  ____________________________________________   PHYSICAL EXAM:  VITAL SIGNS: ED Triage Vitals  Enc Vitals Group     BP 08/04/18 1522 (!) 143/103     Pulse Rate 08/04/18 1522 80     Resp 08/04/18 1522 (!) 25     Temp 08/04/18 1522 98 F (36.7 C)     Temp Source 08/04/18 1522 Oral     SpO2 08/04/18 1522 91 %     Weight 08/04/18 1519 140 lb 14 oz (63.9 kg)     Pain Score 08/04/18 1519 0   Constitutional: Alert and oriented. Well appearing and in no acute distress. Eyes: Conjunctivae are normal. PERRL.  Head: Atraumatic. Nose: No congestion/rhinnorhea. Mouth/Throat: Mucous membranes are moist.  Neck: No stridor. No cervical spine tenderness to palpation. Cardiovascular: Normal rate, regular rhythm. Good peripheral circulation. Grossly normal heart sounds.   Respiratory: Normal respiratory effort.  No retractions. Lungs CTAB. Gastrointestinal: Soft and nontender. No distention.  Musculoskeletal: No lower extremity tenderness nor edema. No gross deformities of extremities. Normal ROM of the bilateral shoulders and hips.  Neurologic:  Normal speech and language. No gross focal neurologic deficits are appreciated.  Skin:  Skin is warm and dry. Left lower leg skin tear 7 x 3 cm over the lateral leg. No laceration.   ____________________________________________   LABS (all labs ordered are listed, but only abnormal results are displayed)  Labs Reviewed  BASIC METABOLIC PANEL - Abnormal; Notable for the following components:      Result Value   BUN 31 (*)    Creatinine, Ser 1.31 (*)     Calcium 7.5 (*)    GFR calc non Af Amer 34 (*)    GFR calc Af Amer 39 (*)    All other components within normal limits  CBC WITH DIFFERENTIAL/PLATELET - Abnormal; Notable for the following components:   RDW 16.5 (*)    Platelets 94 (*)    All other components within normal limits  TROPONIN I   ____________________________________________  EKG   EKG Interpretation  Date/Time:  Monday August 04 2018 15:20:49 EST Ventricular Rate:  81 PR Interval:    QRS Duration: 76 QT Interval:  348 QTC Calculation: 404 R Axis:   -16 Text Interpretation:  Sinus rhythm Probable left atrial enlargement Low voltage, extremity leads No STEMI.  Confirmed by Alona Bene [phone]) on 08/04/2018 4:14:45 PM       ____________________________________________  RADIOLOGY  Dg Chest 2 View  Result Date: 08/04/2018 CLINICAL DATA:  85 year old female status post fall with lower leg laceration. EXAM: CHEST - 2 VIEW COMPARISON:  Chest radiographs 11/02/2014 and earlier. FINDINGS: Upright AP and lateral views. Chronic hiatal hernia, CABG, Calcified aortic atherosclerosis. No acute osseous abnormality identified. Stable lung volumes. No  pneumothorax, pleural effusion or consolidation. Increased pulmonary vascularity. Paucity of bowel gas in the upper abdomen. IMPRESSION: 1. Increased pulmonary vascularity, consider mild or developing interstitial edema. No pleural effusion. 2. Chronic hiatal hernia and Aortic Atherosclerosis (ICD10-I70.0). Electronically Signed   By: Odessa Fleming M.D.   On: 08/04/2018 16:32   Dg Tibia/fibula Left  Result Date: 08/04/2018 CLINICAL DATA:  85 year old female status post fall with lower leg laceration. EXAM: LEFT TIBIA AND FIBULA - 2 VIEW COMPARISON:  None. FINDINGS: Dressing material in place along the lateral lower tib fib. No radiopaque foreign body identified. No soft tissue gas identified. No acute osseous abnormality identified. Degenerative changes at the left knee. Mild for age  IMPRESSION: 1. No acute osseous abnormality identified. 2. Soft tissue injury with no radiopaque foreign body. Electronically Signed   By: Odessa Fleming M.D.   On: 08/04/2018 16:28   Ct Head Wo Contrast  Result Date: 08/04/2018 CLINICAL DATA:  Dizziness and fall EXAM: CT HEAD WITHOUT CONTRAST CT CERVICAL SPINE WITHOUT CONTRAST TECHNIQUE: Multidetector CT imaging of the head and cervical spine was performed following the standard protocol without intravenous contrast. Multiplanar CT image reconstructions of the cervical spine were also generated. COMPARISON:  11/02/2014 FINDINGS: CT HEAD FINDINGS Brain: There is no mass, hemorrhage or extra-axial collection. There is generalized atrophy without lobar predilection. There is hypoattenuation of the periventricular white matter, most commonly indicating chronic ischemic microangiopathy. Vascular: Atherosclerotic calcification of the vertebral and internal carotid arteries at the skull base. No abnormal hyperdensity of the major intracranial arteries or dural venous sinuses. Skull: The visualized skull base, calvarium and extracranial soft tissues are normal. Sinuses/Orbits: No fluid levels or advanced mucosal thickening of the visualized paranasal sinuses. No mastoid or middle ear effusion. The orbits are normal. CT CERVICAL SPINE FINDINGS Alignment: Grade 1 anterolisthesis at C4-5, unchanged. Facets are normally aligned. Skull base and vertebrae: No acute fracture. Soft tissues and spinal canal: No prevertebral fluid or swelling. No visible canal hematoma. Disc levels: Multilevel severe facet hypertrophy with foraminal narrowing greatest at right C5-6 and left C6-7. Upper chest: No pneumothorax, pulmonary nodule or pleural effusion. Other: Normal visualized paraspinal cervical soft tissues. IMPRESSION: 1. Atrophy and chronic ischemic microangiopathy without acute intracranial abnormality. 2. No acute fracture of the cervical spine. Electronically Signed   By: Deatra Robinson M.D.   On: 08/04/2018 17:53   Ct Cervical Spine Wo Contrast  Result Date: 08/04/2018 CLINICAL DATA:  Dizziness and fall EXAM: CT HEAD WITHOUT CONTRAST CT CERVICAL SPINE WITHOUT CONTRAST TECHNIQUE: Multidetector CT imaging of the head and cervical spine was performed following the standard protocol without intravenous contrast. Multiplanar CT image reconstructions of the cervical spine were also generated. COMPARISON:  11/02/2014 FINDINGS: CT HEAD FINDINGS Brain: There is no mass, hemorrhage or extra-axial collection. There is generalized atrophy without lobar predilection. There is hypoattenuation of the periventricular white matter, most commonly indicating chronic ischemic microangiopathy. Vascular: Atherosclerotic calcification of the vertebral and internal carotid arteries at the skull base. No abnormal hyperdensity of the major intracranial arteries or dural venous sinuses. Skull: The visualized skull base, calvarium and extracranial soft tissues are normal. Sinuses/Orbits: No fluid levels or advanced mucosal thickening of the visualized paranasal sinuses. No mastoid or middle ear effusion. The orbits are normal. CT CERVICAL SPINE FINDINGS Alignment: Grade 1 anterolisthesis at C4-5, unchanged. Facets are normally aligned. Skull base and vertebrae: No acute fracture. Soft tissues and spinal canal: No prevertebral fluid or swelling. No visible canal hematoma. Disc levels: Multilevel  severe facet hypertrophy with foraminal narrowing greatest at right C5-6 and left C6-7. Upper chest: No pneumothorax, pulmonary nodule or pleural effusion. Other: Normal visualized paraspinal cervical soft tissues. IMPRESSION: 1. Atrophy and chronic ischemic microangiopathy without acute intracranial abnormality. 2. No acute fracture of the cervical spine. Electronically Signed   By: Deatra Robinson M.D.   On: 08/04/2018 17:53    ____________________________________________   PROCEDURES  Procedure(s) performed:    Debridement Date/Time: 08/04/2018 6:12 PM Performed by: Maia Plan, MD Authorized by: Maia Plan, MD  Consent: Verbal consent obtained. Risks and benefits: risks, benefits and alternatives were discussed Time out: Immediately prior to procedure a "time out" was called to verify the correct patient, procedure, equipment, support staff and site/side marked as required. Preparation: Patient was prepped and draped in the usual sterile fashion. Local anesthesia used: no  Anesthesia: Local anesthesia used: no  Sedation: Patient sedated: no  Patient tolerance: Patient tolerated the procedure well with no immediate complications Comments: 7 x 4 cm area of skin tare with denuded skin was cleaned with betadine. Scissors used to excise the denuded skin flap. Xeroform dressing and gauze applied. No laceration appreciated.       ____________________________________________   INITIAL IMPRESSION / ASSESSMENT AND PLAN / ED COURSE  Pertinent labs & imaging results that were available during my care of the patient were reviewed by me and considered in my medical decision making (see chart for details).  Patient presents to the emergency department for evaluation after a fall.  Suspect mechanical fall clinically but patient unable to give a significant history.  She does have some mild hypoxemia here with unclear baseline oxygen status.  She is not complaining of shortness of breath or chest pain.  She did strike her head during the fall.  Patient is awake, alert with normal neuro exam.  She has normal passive range of motion of her shoulders and hips.  Plan for imaging over the small skin tear in the left lower extremity.  Ration.  This will require some mild debridement and dressing.   06:10 PM CT imaging, chest x-ray, lab work reviewed.  Patient shows possible early pulmonary edema on chest x-ray.  I believe that the hypoxemia documented here is a false reading.  When I go in the room  the oxygen saturation is poorly positioned on her finger.  I reposition this and her oxygen is normal on monitor (98%).  She is having no respiratory difficulty.  Lab work shows mild CKD.  No acute findings on CT imaging.  I have debrided and dressed the wound as above.  Plan for discharge back to nursing facility with plan for wet-to-dry dressings and PCP follow-up for continued wound care.  ____________________________________________  FINAL CLINICAL IMPRESSION(S) / ED DIAGNOSES  Final diagnoses:  Injury of head, initial encounter  Fall, initial encounter  Weakness generalized  Noninfected skin tear of left lower extremity, initial encounter     MEDICATIONS GIVEN DURING THIS VISIT:  Medications  Tdap (BOOSTRIX) injection 0.5 mL (0.5 mLs Intramuscular Given 08/04/18 1553)   Note:  This document was prepared using Dragon voice recognition software and may include unintentional dictation errors.  Alona Bene, MD Emergency Medicine    Hakeen Shipes, Arlyss Repress, MD 08/04/18 [phone]

## 2018-08-07 DIAGNOSIS — N390 Urinary tract infection, site not specified: Secondary | ICD-10-CM | POA: Diagnosis not present

## 2018-08-08 ENCOUNTER — Encounter (HOSPITAL_COMMUNITY): Payer: Self-pay | Admitting: Emergency Medicine

## 2018-08-08 ENCOUNTER — Other Ambulatory Visit: Payer: Self-pay

## 2018-08-08 ENCOUNTER — Emergency Department (HOSPITAL_COMMUNITY): Payer: Medicare Other

## 2018-08-08 ENCOUNTER — Emergency Department (HOSPITAL_COMMUNITY)
Admission: EM | Admit: 2018-08-08 | Discharge: 2018-08-08 | Disposition: A | Discharge status: Home / Self Care | Payer: Medicare Other | Source: Home / Self Care | Attending: Emergency Medicine | Admitting: Emergency Medicine

## 2018-08-08 DIAGNOSIS — Z951 Presence of aortocoronary bypass graft: Secondary | ICD-10-CM

## 2018-08-08 DIAGNOSIS — N179 Acute kidney failure, unspecified: Secondary | ICD-10-CM | POA: Diagnosis not present

## 2018-08-08 DIAGNOSIS — I129 Hypertensive chronic kidney disease with stage 1 through stage 4 chronic kidney disease, or unspecified chronic kidney disease: Secondary | ICD-10-CM | POA: Diagnosis not present

## 2018-08-08 DIAGNOSIS — I10 Essential (primary) hypertension: Secondary | ICD-10-CM

## 2018-08-08 DIAGNOSIS — Z66 Do not resuscitate: Secondary | ICD-10-CM | POA: Diagnosis present

## 2018-08-08 DIAGNOSIS — H9190 Unspecified hearing loss, unspecified ear: Secondary | ICD-10-CM | POA: Diagnosis not present

## 2018-08-08 DIAGNOSIS — N390 Urinary tract infection, site not specified: Secondary | ICD-10-CM | POA: Diagnosis not present

## 2018-08-08 DIAGNOSIS — E890 Postprocedural hypothyroidism: Secondary | ICD-10-CM | POA: Diagnosis not present

## 2018-08-08 DIAGNOSIS — R197 Diarrhea, unspecified: Secondary | ICD-10-CM | POA: Insufficient documentation

## 2018-08-08 DIAGNOSIS — E860 Dehydration: Secondary | ICD-10-CM | POA: Insufficient documentation

## 2018-08-08 DIAGNOSIS — N183 Chronic kidney disease, stage 3 (moderate): Secondary | ICD-10-CM | POA: Diagnosis present

## 2018-08-08 DIAGNOSIS — K529 Noninfective gastroenteritis and colitis, unspecified: Secondary | ICD-10-CM | POA: Diagnosis not present

## 2018-08-08 DIAGNOSIS — E785 Hyperlipidemia, unspecified: Secondary | ICD-10-CM | POA: Insufficient documentation

## 2018-08-08 DIAGNOSIS — K219 Gastro-esophageal reflux disease without esophagitis: Secondary | ICD-10-CM | POA: Diagnosis present

## 2018-08-08 DIAGNOSIS — N3001 Acute cystitis with hematuria: Secondary | ICD-10-CM

## 2018-08-08 DIAGNOSIS — R627 Adult failure to thrive: Secondary | ICD-10-CM | POA: Diagnosis present

## 2018-08-08 DIAGNOSIS — I2510 Atherosclerotic heart disease of native coronary artery without angina pectoris: Secondary | ICD-10-CM

## 2018-08-08 DIAGNOSIS — B9620 Unspecified Escherichia coli [E. coli] as the cause of diseases classified elsewhere: Secondary | ICD-10-CM | POA: Diagnosis not present

## 2018-08-08 DIAGNOSIS — K449 Diaphragmatic hernia without obstruction or gangrene: Secondary | ICD-10-CM | POA: Diagnosis not present

## 2018-08-08 DIAGNOSIS — E7800 Pure hypercholesterolemia, unspecified: Secondary | ICD-10-CM | POA: Diagnosis present

## 2018-08-08 DIAGNOSIS — Z79890 Hormone replacement therapy (postmenopausal): Secondary | ICD-10-CM | POA: Diagnosis not present

## 2018-08-08 DIAGNOSIS — Z79899 Other long term (current) drug therapy: Secondary | ICD-10-CM

## 2018-08-08 DIAGNOSIS — J9811 Atelectasis: Secondary | ICD-10-CM | POA: Diagnosis not present

## 2018-08-08 DIAGNOSIS — M810 Age-related osteoporosis without current pathological fracture: Secondary | ICD-10-CM | POA: Diagnosis not present

## 2018-08-08 DIAGNOSIS — Z90710 Acquired absence of both cervix and uterus: Secondary | ICD-10-CM

## 2018-08-08 LAB — URINALYSIS, ROUTINE W REFLEX MICROSCOPIC
Bilirubin Urine: NEGATIVE
Glucose, UA: NEGATIVE mg/dL
Ketones, ur: 5 mg/dL — AB
Nitrite: POSITIVE — AB
Protein, ur: NEGATIVE mg/dL
SPECIFIC GRAVITY, URINE: 1.017 (ref 1.005–1.030)
pH: 5 (ref 5.0–8.0)

## 2018-08-08 LAB — COMPREHENSIVE METABOLIC PANEL
ALT: 30 U/L (ref 0–44)
AST: 34 U/L (ref 15–41)
Albumin: 2.4 g/dL — ABNORMAL LOW (ref 3.5–5.0)
Alkaline Phosphatase: 101 U/L (ref 38–126)
Anion gap: 10 (ref 5–15)
BUN: 42 mg/dL — ABNORMAL HIGH (ref 8–23)
CO2: 22 mmol/L (ref 22–32)
Calcium: 8 mg/dL — ABNORMAL LOW (ref 8.9–10.3)
Chloride: 103 mmol/L (ref 98–111)
Creatinine, Ser: 1.77 mg/dL — ABNORMAL HIGH (ref 0.44–1.00)
GFR calc Af Amer: 27 mL/min — ABNORMAL LOW (ref >60)
GFR calc non Af Amer: 24 mL/min — ABNORMAL LOW (ref >60)
Glucose, Bld: 91 mg/dL (ref 70–99)
Potassium: 4.1 mmol/L (ref 3.5–5.1)
Sodium: 135 mmol/L (ref 135–145)
TOTAL PROTEIN: 5.7 g/dL — AB (ref 6.5–8.1)
Total Bilirubin: 1.7 mg/dL — ABNORMAL HIGH (ref 0.3–1.2)

## 2018-08-08 LAB — CBC WITH DIFFERENTIAL/PLATELET
Abs Immature Granulocytes: 0.04 10*3/uL (ref 0.00–0.07)
Basophils Absolute: 0 10*3/uL (ref 0.0–0.1)
Basophils Relative: 0 %
Eosinophils Absolute: 0 10*3/uL (ref 0.0–0.5)
Eosinophils Relative: 0 %
HCT: 44.1 % (ref 36.0–46.0)
Hemoglobin: 14.5 g/dL (ref 12.0–15.0)
Immature Granulocytes: 1 %
Lymphocytes Relative: 19 %
Lymphs Abs: 1.3 10*3/uL (ref 0.7–4.0)
MCH: 29.5 pg (ref 26.0–34.0)
MCHC: 32.9 g/dL (ref 30.0–36.0)
MCV: 89.6 fL (ref 80.0–100.0)
MONO ABS: 0.5 10*3/uL (ref 0.1–1.0)
MONOS PCT: 6 %
NEUTROS ABS: 5.3 10*3/uL (ref 1.7–7.7)
Neutrophils Relative %: 74 %
Platelets: 105 10*3/uL — ABNORMAL LOW (ref 150–400)
RBC: 4.92 MIL/uL (ref 3.87–5.11)
RDW: 16.3 % — ABNORMAL HIGH (ref 11.5–15.5)
WBC: 7.1 10*3/uL (ref 4.0–10.5)
nRBC: 0 % (ref 0.0–0.2)

## 2018-08-08 LAB — POC OCCULT BLOOD, ED: Fecal Occult Bld: NEGATIVE

## 2018-08-08 LAB — TROPONIN I: Troponin I: <0.03 ng/mL (ref <0.03)

## 2018-08-08 LAB — LACTIC ACID, PLASMA
LACTIC ACID, VENOUS: 1 mmol/L (ref 0.5–1.9)
Lactic Acid, Venous: 1.7 mmol/L (ref 0.5–1.9)

## 2018-08-08 LAB — LIPASE, BLOOD: LIPASE: 16 U/L (ref 11–51)

## 2018-08-08 MED ORDER — SODIUM CHLORIDE 0.9 % IV SOLN
1.0000 g | Freq: Once | INTRAVENOUS | Status: AC
  Administered 2018-08-08: 1 g via INTRAVENOUS
  Filled 2018-08-08: qty 10

## 2018-08-08 MED ORDER — SODIUM CHLORIDE 0.9 % IV BOLUS
500.0000 mL | Freq: Once | INTRAVENOUS | Status: AC
  Administered 2018-08-08: 500 mL via INTRAVENOUS

## 2018-08-08 MED ORDER — CEPHALEXIN 500 MG PO CAPS: 500.0000 mg | ORAL_CAPSULE | Freq: Four times a day (QID) | ORAL | 0 refills | Status: DC

## 2018-08-08 NOTE — Discharge Instructions (Addendum)
Return for any new or worse symptoms.  Today's work-up is consistent with urinary tract infection.  Dose of Rocephin given here prior to discharge.  Continue with the Keflex as directed for the next 7 days.  Urine culture is been sent.  Return for any new or worse symptoms.

## 2018-08-08 NOTE — ED Provider Notes (Addendum)
[name]   CSN: [id number] Arrival date & time: 08/08/18  1046     History   Chief Complaint Chief Complaint  Patient presents with  . Diarrhea    HPI Aura Elizabeth [name] is a 85 y.o. female.  HPI  Pt was seen at 1100. Per EMS, NH report, and pt: c/o gradual onset and persistence of multiple intermittent episodes of diarrhea that began this morning at 0700. Pt was given 2 imodium without improvement of her symptoms. Pt denies any other complaints. Denies N/V, no black or blood in stools, no CP/SOB, no abd pain, no back pain, no focal motor weakness, no fevers, no rash.    Update by family at bedside 1400:  States pt has f/u appt with GI MD on Monday (in 3 days) for chronic/recurrent diarrhea for the past several months.   Past Medical History:  Diagnosis Date  . Anemia   . Arthritis   . B12 deficiency   . C. difficile colitis Nov 2014   Treated with Flagyl  . Coronary artery disease   . GERD (gastroesophageal reflux disease)   . Hypercholesterolemia   . Hyperlipidemia   . Hypertension   . Hypothyroidism   . Osteoporosis   . Thyroid goiter    s/p surgery now on Synthroid  . Zenker diverticulum     Patient Active Problem List   Diagnosis Date Noted  . Hypokalemia 07/30/2016  . Physical deconditioning 07/30/2016  . Generalized weakness   . Iron deficiency anemia due to chronic blood loss   . Rectal bleeding 10/16/2014  . Anemia 10/16/2014  . Oropharyngeal dysphagia 11/02/2013  . Clostridium difficile colitis 06/01/2013  . Acute colitis 05/29/2013  . Hematochezia 05/29/2013  . HTN (hypertension), benign 05/29/2013  . Hypothyroidism 05/29/2013  . Hypotension, unspecified 05/29/2013  . ARF (acute renal failure) (HCC) 05/29/2013  . Anemia due to acute blood loss 05/29/2013  . Ischemic colitis (HCC) 09/02/2012  . Colitis 07/21/2012  . Viral syndrome 07/19/2012  . CAP (community acquired pneumonia), possibly 07/19/2012  .  Dehydration 07/19/2012  . Diarrhea 07/19/2012  . Hyponatremia 07/19/2012  . GERD 02/27/2010  . Dysphagia 02/27/2010  . ESOPHAGEAL STRICTURE 11/30/2008    Past Surgical History:  Procedure Laterality Date  . ABDOMINAL HYSTERECTOMY    . APPENDECTOMY    . BUNIONECTOMY    . CORONARY ARTERY BYPASS GRAFT     Status Post four-vessel   . ESOPHAGEAL DILATION    . ESOPHAGOGASTRODUODENOSCOPY  6/10    Schatzki's ring, otherwise endoscopically normal esophagus, status post dilation (3F), with mucosal disruption through the cervical segment as well as the Schatzki's ring. Hiatal hernia. segment as well as the Schatzki's ring. Hiatal hernia.  Marland Kitchen. ESOPHAGOGASTRODUODENOSCOPY  09/02/2007   Cervical esophageal web, Schatzki's ring, status post  dilation and disruption as described above. Otherwise, normal esophagus  . ESOPHAGOGASTRODUODENOSCOPY  August 2011   Zenker's diverticulum, Schatzki's ring s/p dilation, mod to large HH, instructions to pursue BPE if recurrent dysphagia to assess Zenker's  . ESOPHAGOGASTRODUODENOSCOPY  03/12/12   Rourk-dilated 38F, cervical web, Schatzki's ring, small HH, small Zenker's diverticulum  . ESOPHAGOGASTRODUODENOSCOPY N/A 11/26/2013   Dr. Jena Gaussourk: Zenker's diverticulum, cervical esophageal web and Schatzi's ring s/p dilation, large hiatal hernia, consider ENT referral if persistent dysphagia.   Marland Kitchen. MALONEY DILATION N/A 11/26/2013   Procedure: Elease HashimotoMALONEY DILATION;  Surgeon: Corbin Adeobert M Rourk, MD;  Location: [facility];  Service: Endoscopy;  Laterality: N/A;     OB History  Gravida      Para      Term      Preterm      AB      Living  0     SAB      TAB      Ectopic      Multiple      Live Births               Home Medications    Prior to Admission medications   Medication Sig Start Date End Date Taking? Authorizing Provider  acetaminophen (TYLENOL) 325 MG tablet Take 2 tablets (650 mg total) by mouth every 6 (six) hours as needed for mild pain (or  Fever >/= 101). 08/02/16   Kari BaarsHawkins, Edward, MD  calcium gluconate 500 MG tablet Take 1 tablet by mouth daily.    [provider]  cholecalciferol (VITAMIN D3) 25 MCG (1000 UT) tablet Take 1,000 Units by mouth daily.    [provider]  ENSURE (ENSURE) Take 237 mLs by mouth 2 (two) times daily between meals.    [provider]  Ferrous Fumarate (HEMOCYTE - 106 MG FE) 324 (106 Fe) MG TABS tablet Take 1 tablet (106 mg of iron total) by mouth daily. 07/25/16   Kari BaarsHawkins, Edward, MD  levothyroxine (SYNTHROID, LEVOTHROID) 100 MCG tablet Take 100 mcg by mouth daily before breakfast.    [provider]  loperamide (IMODIUM A-D) 2 MG tablet Take 2 mg by mouth every 6 (six) hours as needed for diarrhea or loose stools.    [provider]  polyethylene glycol (MIRALAX / GLYCOLAX) packet Take 17 g by mouth daily as needed for mild constipation. 08/02/16   Kari BaarsHawkins, Edward, MD  Saccharomyces boulardii (PROBIOTIC) 250 MG CAPS Take 1 capsule by mouth daily.    [provider]    Family History Family History  Problem Relation Age of Onset  . Colon cancer Neg Hx     Social History Social History   Tobacco Use  . Smoking status: Never Smoker  . Smokeless tobacco: Never Used  Substance Use Topics  . Alcohol use: No  . Drug use: No     Allergies   Patient has no known allergies.   Review of Systems Review of Systems ROS: Statement: All systems negative except as marked or noted in the HPI; Constitutional: Negative for fever and chills. ; ; Eyes: Negative for eye pain, redness and discharge. ; ; ENMT: Negative for ear pain, hoarseness, nasal congestion, sinus pressure and sore throat. ; ; Cardiovascular: Negative for chest pain, palpitations, diaphoresis, dyspnea and peripheral edema. ; ; Respiratory: Negative for cough, wheezing and stridor. ; ; Gastrointestinal: +diarrhea. Negative for nausea, vomiting, abdominal pain, blood in stool, hematemesis,  jaundice and rectal bleeding. . ; ; Genitourinary: Negative for dysuria, flank pain and hematuria. ; ; Musculoskeletal: Negative for back pain and neck pain. Negative for swelling and trauma.; ; Skin: Negative for pruritus, rash, abrasions, blisters, bruising and skin lesion.; ; Neuro: Negative for headache, lightheadedness and neck stiffness. Negative for weakness, altered level of consciousness, altered mental status, extremity weakness, paresthesias, involuntary movement, seizure and syncope.       Physical Exam Updated Vital Signs BP 103/68 (BP Location: Left Arm)   Pulse 89   Temp 97.6 F (36.4 C) (Axillary)   Resp 20   Wt 63 kg   SpO2 94%   BMI 22.42 kg/m    12:05 Orthostatic Vital Signs VB  Orthostatic Lying  BP- Lying: 104/64  Pulse- Lying: 73      Orthostatic Sitting  BP- Sitting: 106/65  Pulse- Sitting: 75      Orthostatic Standing at 0 minutes  BP- Standing at 0 minutes: (Pt unable to stand, too weak to stand the whole time)    Physical Exam 1105: Physical examination:  Nursing notes reviewed; Vital signs and O2 SAT reviewed;  Constitutional: Well developed, Well nourished, In no acute distress; Head:  Normocephalic, atraumatic; Eyes: EOMI, PERRL, No scleral icterus; ENMT: Mouth and pharynx normal, Mucous membranes dry; Neck: Supple, Full range of motion, No lymphadenopathy; Cardiovascular: Regular rate and rhythm, No gallop; Respiratory: Breath sounds clear & equal bilaterally, No wheezes.  Speaking full sentences with ease, Normal respiratory effort/excursion; Chest: Nontender, Movement normal; Abdomen: Soft, +mild diffuse tenderness to palp. Nondistended, Normal bowel sounds. Rectal exam performed w/permission of pt and ED RN chaperone present.  Anal tone normal.  Non-tender, soft brown stool in rectal vault, heme neg.  No fissures, no external hemorrhoids, no palp masses.; Genitourinary: No CVA tenderness; Extremities: Peripheral pulses normal, No tenderness, +tr  pedal edema bilat. No calf asymmetry.; Neuro: AA&Ox3, very HOH. No facial droop. Speech clear. Grips equal. No gross focal motor deficits in extremities.; Skin: Color normal, Warm, Dry.     ED Treatments / Results  Labs (all labs ordered are listed, but only abnormal results are displayed)   EKG EKG Interpretation  Date/Time:  Friday August 08 2018 12:09:16 EST Ventricular Rate:  75 PR Interval:    QRS Duration: 81 QT Interval:  386 QTC Calculation: 432 R Axis:   -7 Text Interpretation:  Sinus or ectopic atrial rhythm Low voltage, extremity and precordial leads Abnormal R-wave progression, early transition Artifact When compared with ECG of 08/04/2018 No significant change was found Confirmed by Samuel Jester [phone]) on 08/08/2018 12:29:05 PM   Radiology   Procedures Procedures (including critical care time)  Medications Ordered in ED Medications  sodium chloride 0.9 % bolus 500 mL (has no administration in time range)     Initial Impression / Assessment and Plan / ED Course  I have reviewed the triage vital signs and the nursing notes.  Pertinent labs & imaging results that were available during my care of the patient were reviewed by me and considered in my medical decision making (see chart for details).    MDM Reviewed: previous chart, nursing note and vitals Reviewed previous: labs and ECG Interpretation: labs, ECG, x-ray and CT scan    Results for orders placed or performed during the hospital encounter of 08/08/18  Comprehensive metabolic panel  Result Value Ref Range   Sodium 135 135 - 145 mmol/L   Potassium 4.1 3.5 - 5.1 mmol/L   Chloride 103 98 - 111 mmol/L   CO2 22 22 - 32 mmol/L   Glucose, Bld 91 70 - 99 mg/dL   BUN 42 (H) 8 - 23 mg/dL   Creatinine, Ser 7.03 (H) 0.44 - 1.00 mg/dL   Calcium 8.0 (L) 8.9 - 10.3 mg/dL   Total Protein 5.7 (L) 6.5 - 8.1 g/dL   Albumin 2.4 (L) 3.5 - 5.0 g/dL   AST 34 15 - 41 U/L   ALT 30 0 - 44 U/L   Alkaline  Phosphatase 101 38 - 126 U/L   Total Bilirubin 1.7 (H) 0.3 - 1.2 mg/dL   GFR calc non Af Amer 24 (L) >60 mL/min   GFR calc Af Amer 27 (L) >60 mL/min   Anion gap 10  5 - 15  Lipase, blood  Result Value Ref Range   Lipase 16 11 - 51 U/L  Troponin I - Once  Result Value Ref Range   Troponin I <0.03 <0.03 ng/mL  Lactic acid, plasma  Result Value Ref Range   Lactic Acid, Venous 1.7 0.5 - 1.9 mmol/L  Lactic acid, plasma  Result Value Ref Range   Lactic Acid, Venous 1.0 0.5 - 1.9 mmol/L  CBC with Differential  Result Value Ref Range   WBC 7.1 4.0 - 10.5 K/uL   RBC 4.92 3.87 - 5.11 MIL/uL   Hemoglobin 14.5 12.0 - 15.0 g/dL   HCT 16.1 09.6 - 04.5 %   MCV 89.6 80.0 - 100.0 fL   MCH 29.5 26.0 - 34.0 pg   MCHC 32.9 30.0 - 36.0 g/dL   RDW 40.9 (H) 81.1 - 91.4 %   Platelets 105 (L) 150 - 400 K/uL   nRBC 0.0 0.0 - 0.2 %   Neutrophils Relative % 74 %   Neutro Abs 5.3 1.7 - 7.7 K/uL   Lymphocytes Relative 19 %   Lymphs Abs 1.3 0.7 - 4.0 K/uL   Monocytes Relative 6 %   Monocytes Absolute 0.5 0.1 - 1.0 K/uL   Eosinophils Relative 0 %   Eosinophils Absolute 0.0 0.0 - 0.5 K/uL   Basophils Relative 0 %   Basophils Absolute 0.0 0.0 - 0.1 K/uL   Immature Granulocytes 1 %   Abs Immature Granulocytes 0.04 0.00 - 0.07 K/uL  POC occult blood, ED  Result Value Ref Range   Fecal Occult Bld NEGATIVE NEGATIVE   Ct Abdomen Pelvis Wo Contrast Result Date: 08/08/2018 CLINICAL DATA:  Diarrhea.  History of colitis EXAM: CT ABDOMEN AND PELVIS WITHOUT CONTRAST TECHNIQUE: Multidetector CT imaging of the abdomen and pelvis was performed following the standard protocol without IV contrast. COMPARISON:  07/21/2016 FINDINGS: Lower chest: Large hiatal hernia containing most of the stomach, which is partially visualized. Distal transverse colon also attempts to herniate through the diaphragm at this level. Reticulation in the lower lungs which is a combination of both chronic opacity and atelectasis based on prior.  Hepatobiliary: No focal liver abnormality.Full gallbladder with no evident inflammation or stone. Pancreas: Generalized atrophy. Spleen: Unremarkable. Adrenals/Urinary Tract: Negative adrenals. No hydronephrosis or stone. Renal atrophy with grade or cortical thinning on the left. Unremarkable bladder. Stomach/Bowel: Equivocal for circumferential wall thickening in the distal small bowel. No obstruction is seen. No colonic wall thickening noted. History of appendectomy. Vascular/Lymphatic: Atherosclerotic calcification. No mass or adenopathy. Reproductive:Hysterectomy Other: Small volume pelvic ascites, presumably reactive in this setting Musculoskeletal: Diffuse spinal degeneration with levoscoliosis. No acute or aggressive finding IMPRESSION: 1. Equivocal for distal enteritis.  No detectable colitis period 2. Small volume and likely reactive ascites. 3. Chronic findings are described above. Electronically Signed   By: Marnee Spring M.D.   On: 08/08/2018 11:44   Dg Chest 2 View Result Date: 08/08/2018 CLINICAL DATA:  Weakness and diarrhea EXAM: CHEST - 2 VIEW COMPARISON:  August 04, 2018 chest radiograph. Chest CT July 19, 2012 FINDINGS: There is atelectatic change in the left base. There is no frank edema or consolidation. Heart is upper normal in size with pulmonary vascularity normal. There is a sizable hiatal hernia. Patient is status post coronary artery bypass grafting. There is aortic atherosclerosis. Bones are diffusely osteoporotic. IMPRESSION: Left base atelectasis. No frank consolidation. Heart upper normal in size with postoperative changes and aortic atherosclerosis. Bones osteoporotic. Aortic Atherosclerosis (ICD10-I70.0). Electronically Signed  By: Bretta Bang III M.D.   On: 08/08/2018 11:37    Results for ADREONNA, YONTZ (MRN [id number]) as of 08/08/2018 12:49  Ref. Range 07/30/2016 11:24 07/30/2016 20:08 07/31/2016 05:12 08/04/2018 16:41 08/08/2018 11:56  BUN Latest Ref Range: 8 - 23  mg/dL 15 12 12 31  (H) 42 (H)  Creatinine Latest Ref Range: 0.44 - 1.00 mg/dL 8.11 (H) 9.14 7.82 9.56 (H) 1.77 (H)    Results for BREI, POCIASK (MRN [id number]) as of 08/08/2018 12:49  Ref. Range 08/07/2016 05:00 08/04/2018 16:41 08/08/2018 11:56  Platelets Latest Ref Range: 150 - 400 K/uL 201 94 (L) 105 (L)     1610:  Platelets per baseline. BUN/Cr mildly elevated from baseline. No stooling while in the ED. No clear indication at this time for admission. Pt already has GI MD f/u in 3 days. IVF bolus ordered. Udip and PO challenge pending. Sign out to Dr. Deretha Emory.        Final Clinical Impressions(s) / ED Diagnoses   Final diagnoses:  None    ED Discharge Orders    None          Samuel Jester, DO 08/08/18 1614

## 2018-08-08 NOTE — ED Triage Notes (Addendum)
From [facility]. Diarrhea that started at 0700 today.  Given 2 imodium with no relief.  Pt denies any pain

## 2018-08-08 NOTE — ED Provider Notes (Signed)
Patient's urinalysis is consistent with urinary tract infection.  Culture sent.  We will give a dose of Rocephin here IV and continue Keflex for the next 7 days.   Vanetta Mulders, MD 08/08/18 [phone]

## 2018-08-08 NOTE — ED Notes (Signed)
In and out attempted twice, unsuccessful each time.

## 2018-08-11 ENCOUNTER — Encounter: Payer: Self-pay | Admitting: Gastroenterology

## 2018-08-11 ENCOUNTER — Inpatient Hospital Stay (HOSPITAL_COMMUNITY)
Admission: AD | Admit: 2018-08-11 | Discharge: 2018-08-12 | DRG: 641 | Disposition: A | Discharge status: Home Health | Payer: Medicare Other | Source: Ambulatory Visit | Attending: Internal Medicine | Admitting: Internal Medicine

## 2018-08-11 ENCOUNTER — Ambulatory Visit: Payer: Medicare Other | Admitting: Gastroenterology

## 2018-08-11 ENCOUNTER — Other Ambulatory Visit: Payer: Self-pay

## 2018-08-11 ENCOUNTER — Encounter (HOSPITAL_COMMUNITY): Payer: Self-pay | Admitting: *Deleted

## 2018-08-11 VITALS — BP 98/75 | HR 84 | Temp 97.0°F | Ht 64.0 in | Wt 108.8 lb

## 2018-08-11 DIAGNOSIS — H9190 Unspecified hearing loss, unspecified ear: Secondary | ICD-10-CM | POA: Diagnosis not present

## 2018-08-11 DIAGNOSIS — E890 Postprocedural hypothyroidism: Secondary | ICD-10-CM | POA: Diagnosis not present

## 2018-08-11 DIAGNOSIS — I10 Essential (primary) hypertension: Secondary | ICD-10-CM | POA: Diagnosis present

## 2018-08-11 DIAGNOSIS — E7800 Pure hypercholesterolemia, unspecified: Secondary | ICD-10-CM | POA: Diagnosis not present

## 2018-08-11 DIAGNOSIS — R197 Diarrhea, unspecified: Secondary | ICD-10-CM | POA: Diagnosis present

## 2018-08-11 DIAGNOSIS — N390 Urinary tract infection, site not specified: Secondary | ICD-10-CM | POA: Diagnosis not present

## 2018-08-11 DIAGNOSIS — E860 Dehydration: Secondary | ICD-10-CM | POA: Diagnosis present

## 2018-08-11 DIAGNOSIS — K529 Noninfective gastroenteritis and colitis, unspecified: Secondary | ICD-10-CM | POA: Diagnosis not present

## 2018-08-11 DIAGNOSIS — R531 Weakness: Secondary | ICD-10-CM | POA: Diagnosis not present

## 2018-08-11 DIAGNOSIS — K219 Gastro-esophageal reflux disease without esophagitis: Secondary | ICD-10-CM | POA: Diagnosis not present

## 2018-08-11 DIAGNOSIS — R5381 Other malaise: Secondary | ICD-10-CM | POA: Diagnosis present

## 2018-08-11 DIAGNOSIS — I2510 Atherosclerotic heart disease of native coronary artery without angina pectoris: Secondary | ICD-10-CM | POA: Diagnosis not present

## 2018-08-11 DIAGNOSIS — Z951 Presence of aortocoronary bypass graft: Secondary | ICD-10-CM | POA: Diagnosis not present

## 2018-08-11 DIAGNOSIS — Z79890 Hormone replacement therapy (postmenopausal): Secondary | ICD-10-CM | POA: Diagnosis not present

## 2018-08-11 DIAGNOSIS — I129 Hypertensive chronic kidney disease with stage 1 through stage 4 chronic kidney disease, or unspecified chronic kidney disease: Secondary | ICD-10-CM | POA: Diagnosis not present

## 2018-08-11 DIAGNOSIS — Z79899 Other long term (current) drug therapy: Secondary | ICD-10-CM | POA: Diagnosis not present

## 2018-08-11 DIAGNOSIS — N179 Acute kidney failure, unspecified: Secondary | ICD-10-CM

## 2018-08-11 DIAGNOSIS — E785 Hyperlipidemia, unspecified: Secondary | ICD-10-CM | POA: Diagnosis not present

## 2018-08-11 DIAGNOSIS — B9620 Unspecified Escherichia coli [E. coli] as the cause of diseases classified elsewhere: Secondary | ICD-10-CM | POA: Diagnosis not present

## 2018-08-11 DIAGNOSIS — Z90710 Acquired absence of both cervix and uterus: Secondary | ICD-10-CM | POA: Diagnosis not present

## 2018-08-11 DIAGNOSIS — Z66 Do not resuscitate: Secondary | ICD-10-CM | POA: Diagnosis not present

## 2018-08-11 DIAGNOSIS — E039 Hypothyroidism, unspecified: Secondary | ICD-10-CM | POA: Diagnosis present

## 2018-08-11 DIAGNOSIS — R627 Adult failure to thrive: Secondary | ICD-10-CM | POA: Diagnosis not present

## 2018-08-11 DIAGNOSIS — N183 Chronic kidney disease, stage 3 (moderate): Secondary | ICD-10-CM | POA: Diagnosis not present

## 2018-08-11 DIAGNOSIS — K449 Diaphragmatic hernia without obstruction or gangrene: Secondary | ICD-10-CM | POA: Diagnosis not present

## 2018-08-11 DIAGNOSIS — M810 Age-related osteoporosis without current pathological fracture: Secondary | ICD-10-CM | POA: Diagnosis not present

## 2018-08-11 LAB — CBC
HCT: 38.6 % (ref 36.0–46.0)
Hemoglobin: 12.7 g/dL (ref 12.0–15.0)
MCH: 28.6 pg (ref 26.0–34.0)
MCHC: 32.9 g/dL (ref 30.0–36.0)
MCV: 86.9 fL (ref 80.0–100.0)
Platelets: 99 10*3/uL — ABNORMAL LOW (ref 150–400)
RBC: 4.44 MIL/uL (ref 3.87–5.11)
RDW: 16.8 % — ABNORMAL HIGH (ref 11.5–15.5)
WBC: 5.5 10*3/uL (ref 4.0–10.5)
nRBC: 0 % (ref 0.0–0.2)

## 2018-08-11 LAB — COMPREHENSIVE METABOLIC PANEL
ALT: 27 U/L (ref 0–44)
AST: 31 U/L (ref 15–41)
Albumin: 2.1 g/dL — ABNORMAL LOW (ref 3.5–5.0)
Alkaline Phosphatase: 83 U/L (ref 38–126)
Anion gap: 6 (ref 5–15)
BUN: 33 mg/dL — ABNORMAL HIGH (ref 8–23)
CHLORIDE: 110 mmol/L (ref 98–111)
CO2: 20 mmol/L — AB (ref 22–32)
Calcium: 7.7 mg/dL — ABNORMAL LOW (ref 8.9–10.3)
Creatinine, Ser: 1.42 mg/dL — ABNORMAL HIGH (ref 0.44–1.00)
GFR calc Af Amer: 36 mL/min — ABNORMAL LOW (ref >60)
GFR, EST NON AFRICAN AMERICAN: 31 mL/min — AB (ref >60)
Glucose, Bld: 94 mg/dL (ref 70–99)
Potassium: 4 mmol/L (ref 3.5–5.1)
Sodium: 136 mmol/L (ref 135–145)
Total Bilirubin: 1.1 mg/dL (ref 0.3–1.2)
Total Protein: 4.9 g/dL — ABNORMAL LOW (ref 6.5–8.1)

## 2018-08-11 LAB — URINE CULTURE: Culture: >=100000 — AB

## 2018-08-11 MED ORDER — LEVOTHYROXINE SODIUM 100 MCG PO TABS
100.0000 ug | ORAL_TABLET | Freq: Every day | ORAL | Status: DC
  Administered 2018-08-12: 100 ug via ORAL
  Filled 2018-08-11: qty 1

## 2018-08-11 MED ORDER — CALCIUM GLUCONATE 500 MG PO TABS: 1.0000 | ORAL_TABLET | Freq: Every day | ORAL | Status: DC

## 2018-08-11 MED ORDER — CALCIUM CARBONATE ANTACID 500 MG PO CHEW
0.5000 | CHEWABLE_TABLET | Freq: Every day | ORAL | Status: DC
  Administered 2018-08-12: 100 mg via ORAL
  Filled 2018-08-11: qty 1

## 2018-08-11 MED ORDER — VITAMIN D 25 MCG (1000 UNIT) PO TABS
1000.0000 [IU] | ORAL_TABLET | Freq: Every day | ORAL | Status: DC
  Administered 2018-08-11 – 2018-08-12 (×2): 1000 [IU] via ORAL
  Filled 2018-08-11 (×2): qty 1

## 2018-08-11 MED ORDER — MUPIROCIN 2 % EX OINT
TOPICAL_OINTMENT | Freq: Every day | CUTANEOUS | Status: DC
  Administered 2018-08-11 – 2018-08-12 (×2): via TOPICAL
  Filled 2018-08-11: qty 22

## 2018-08-11 MED ORDER — ONDANSETRON HCL 4 MG PO TABS: 4.0000 mg | ORAL_TABLET | Freq: Four times a day (QID) | ORAL | Status: DC | PRN

## 2018-08-11 MED ORDER — LOPERAMIDE HCL 2 MG PO CAPS: 2.0000 mg | ORAL_CAPSULE | Freq: Four times a day (QID) | ORAL | Status: DC | PRN

## 2018-08-11 MED ORDER — FERROUS FUMARATE 324 (106 FE) MG PO TABS
1.0000 | ORAL_TABLET | Freq: Every day | ORAL | Status: DC
  Administered 2018-08-11 – 2018-08-12 (×2): 106 mg via ORAL
  Filled 2018-08-11 (×2): qty 1

## 2018-08-11 MED ORDER — CEPHALEXIN 500 MG PO CAPS: 500.0000 mg | ORAL_CAPSULE | Freq: Four times a day (QID) | ORAL | Status: DC

## 2018-08-11 MED ORDER — LACTATED RINGERS IV SOLN
INTRAVENOUS | Status: DC
  Administered 2018-08-11: 17:00:00 via INTRAVENOUS

## 2018-08-11 MED ORDER — ACETAMINOPHEN 325 MG PO TABS: 650.0000 mg | ORAL_TABLET | Freq: Four times a day (QID) | ORAL | Status: DC | PRN

## 2018-08-11 MED ORDER — CEPHALEXIN 250 MG PO CAPS
250.0000 mg | ORAL_CAPSULE | Freq: Two times a day (BID) | ORAL | Status: DC
  Administered 2018-08-11 – 2018-08-12 (×3): 250 mg via ORAL
  Filled 2018-08-11 (×3): qty 1

## 2018-08-11 MED ORDER — ONDANSETRON HCL 4 MG/2ML IJ SOLN: 4.0000 mg | Freq: Four times a day (QID) | INTRAMUSCULAR | Status: DC | PRN

## 2018-08-11 MED ORDER — ENSURE ENLIVE PO LIQD
237.0000 mL | Freq: Two times a day (BID) | ORAL | Status: DC
  Administered 2018-08-11 – 2018-08-12 (×2): 237 mL via ORAL

## 2018-08-11 MED ORDER — SACCHAROMYCES BOULARDII 250 MG PO CAPS
ORAL_CAPSULE | Freq: Every day | ORAL | Status: DC
  Administered 2018-08-11 – 2018-08-12 (×2): 250 mg via ORAL
  Filled 2018-08-11 (×2): qty 1

## 2018-08-11 NOTE — Plan of Care (Signed)
  Problem: Acute Rehab PT Goals(only PT should resolve) Goal: Pt Will Go Sit To Supine/Side Outcome: Progressing Flowsheets (Taken 08/11/2018 1548) Pt will go Sit to Supine/Side: with supervision Goal: Patient Will Transfer Sit To/From Stand Outcome: Progressing Flowsheets (Taken 08/11/2018 1548) Patient will transfer sit to/from stand: with min guard assist Goal: Pt Will Transfer Bed To Chair/Chair To Bed Outcome: Progressing Flowsheets (Taken 08/11/2018 1548) Pt will Transfer Bed to Chair/Chair to Bed: min guard assist Goal: Pt Will Ambulate Outcome: Progressing Flowsheets (Taken 08/11/2018 1548) Pt will Ambulate: 50 feet; with min guard assist; with rolling walker   3:49 PM, 08/11/18 Domenick Bookbinder, PT, DPT Physical Therapist with [facility] [phone] mobile phone

## 2018-08-11 NOTE — Evaluation (Signed)
Physical Therapy Evaluation Patient Details Name: Elizabeth [name] MRN: [id number] DOB: [birth year] Today's Date: 08/11/2018   History of Present Illness  Elizabeth [name] is a 85 y.o. female with medical history significant for hypertension, dyslipidemia, hypothyroidism, osteoporosis, CAD, and chronic diarrhea who has been admitted directly from GI office on account of some worsening weakness and lack of appetite that has been manifesting in the setting of chronic, intermittent diarrhea.  She appears to be dehydrated and was recently seen in the ED on 1/17 and was noted to have UTI at that time.  She was given 1 dose of IV Rocephin and sent home on Keflex.  Her creatinine at that time was noted to be elevated at 1.77.  Urine culture did show positivity for E. coli sensitive to Keflex.  Nephew at bedside states that patient has lost approximately 10 pounds and continues to have significant weakness and as a result, many falls with now a wound to her left leg.  Her laboratory data has been reviewed from the ED visit on 1/17.    Clinical Impression  Patient below baseline for functional mobility and gait, slightly limited secondary to generalized weakness and fatigue during activity. Patient presents supine in bed, pleasant and agreeable to therapy. Patient requires min assist for bed mobility, increased time, and use of bed rail to upright trunk. Patient requires min assist for transfers and ambulation of 12 feet with RW demonstrating initial dizziness upon standing that resolves with time and unsteadiness throughout gait training. Patient requires min assist with RW management with turns due to unsteadiness and cues to maintain body within RW frame to maintain balance and safety. Patient limited by fatigue and requires verbal cues for activity to reduce fear of falling anxiety. Patient left supine in bed with head of bed elevated, call button in lap, and bed alarm on.  Patient will benefit from continued  physical therapy in hospital and recommended venue below to increase strength, balance, endurance for safe ADLs and gait.     Follow Up Recommendations Home health PT;Supervision/Assistance - 24 hour;Supervision for mobility/OOB    Equipment Recommendations  None recommended by PT    Recommendations for Other Services       Precautions / Restrictions Precautions Precautions: Fall Restrictions Weight Bearing Restrictions: No      Mobility  Bed Mobility Overal bed mobility: Needs Assistance Bed Mobility: Supine to Sit;Sit to Supine     Supine to sit: Min assist Sit to supine: Min assist   General bed mobility comments: increased time, use of bed rail  Transfers Overall transfer level: Needs assistance   Transfers: Sit to/from Stand Sit to Stand: Min guard         General transfer comment: increased time, initial dizziness and unsteadiness upon standing resolving with time  Ambulation/Gait Ambulation/Gait assistance: Min assist Gait Distance (Feet): 12 Feet Assistive device: Rolling walker (2 wheeled) Gait Pattern/deviations: Decreased step length - right;Decreased step length - left;Decreased stride length;Trunk flexed Gait velocity: decreased   General Gait Details: flat foot posture with unsteady bil foot progression and unsteadiness throughout gait cycle requiring min assist for steadying and to prevent loss of balance  Stairs            Wheelchair Mobility    Modified Rankin (Stroke Patients Only)       Balance Overall balance assessment: Needs assistance Sitting-balance support: Feet supported;Bilateral upper extremity supported Sitting balance-Leahy Scale: Fair     Standing balance support: During functional activity;Bilateral upper extremity  supported Standing balance-Leahy Scale: Poor Standing balance comment: fair/poor with RW                             Pertinent Vitals/Pain Pain Assessment: No/denies pain    Home  Living Family/patient expects to be discharged to:: Assisted living               Home Equipment: Walker - 2 wheels Additional Comments: household ambulator using RW with person to assist present due to increased incidence of falls    Prior Function Level of Independence: Needs assistance   Gait / Transfers Assistance Needed: Patient reports she ambulates with RW and person to assist due to increased incidence of falls  ADL's / Homemaking Assistance Needed: Patient reports she needs assistance with ADLs, but unable to specify what activities she needs help with and how much help  Comments: All information per patient report and no family at bedside to verify     Hand Dominance        Extremity/Trunk Assessment   Upper Extremity Assessment Upper Extremity Assessment: Generalized weakness    Lower Extremity Assessment Lower Extremity Assessment: Generalized weakness    Cervical / Trunk Assessment Cervical / Trunk Assessment: Kyphotic  Communication      Cognition Arousal/Alertness: Awake/alert Behavior During Therapy: WFL for tasks assessed/performed Overall Cognitive Status: Within Functional Limits for tasks assessed(no family/caregiver present to determine baseline cognitive functioning)                                        General Comments      Exercises     Assessment/Plan    PT Assessment Patient needs continued PT services  PT Problem List Decreased strength;Decreased activity tolerance;Decreased balance;Decreased mobility       PT Treatment Interventions Gait training;Functional mobility training;Therapeutic activities;Therapeutic exercise;Patient/family education    PT Goals (Current goals can be found in the Care Plan section)  Acute Rehab PT Goals Patient Stated Goal: return home/ALF PT Goal Formulation: With patient Time For Goal Achievement: 08/18/18 Potential to Achieve Goals: Good    Frequency Min 2X/week   Barriers  to discharge        Co-evaluation               AM-PAC PT "6 Clicks" Mobility  Outcome Measure Help needed turning from your back to your side while in a flat bed without using bedrails?: A Little Help needed moving from lying on your back to sitting on the side of a flat bed without using bedrails?: A Little Help needed moving to and from a bed to a chair (including a wheelchair)?: A Little Help needed standing up from a chair using your arms (e.g., wheelchair or bedside chair)?: A Little Help needed to walk in hospital room?: A Little Help needed climbing 3-5 steps with a railing? : A Lot 6 Click Score: 17    End of Session Equipment Utilized During Treatment: Gait belt Activity Tolerance: Patient tolerated treatment well;Patient limited by fatigue Patient left: in bed;with call bell/phone within reach;with bed alarm set Nurse Communication: Mobility status PT Visit Diagnosis: Unsteadiness on feet (R26.81);Other abnormalities of gait and mobility (R26.89);Muscle weakness (generalized) (M62.81)    Time: 4097-3532 PT Time Calculation (min) (ACUTE ONLY): 25 min   Charges:   PT Evaluation $PT Eval Moderate Complexity: 1 Mod PT Treatments $Gait  Training: 8-22 mins        3:47 PM, 08/11/18 Domenick Bookbinder, PT, DPT Physical Therapist with [facility] [phone] mobile phone

## 2018-08-11 NOTE — Patient Instructions (Signed)
Plan for admission to [facility] for dehydration, weakness, diarrhea, evaluate leg wound.

## 2018-08-11 NOTE — Progress Notes (Signed)
PCP: Chip Boer Physician  Primary Gastroenterologist:  Roetta Sessions, MD   Chief Complaint  Patient presents with  . Diarrhea    x 1 month off/on. Was seen in ED on Friday. Prescribed abx's    HPI:  Elizabeth [name] is a 85 y.o. female here for further evaluation and diarrhea.  We last saw the patient during hospitalization back in early 2018. See below for pertinent GI history.   Patient has had two recent ED visits.  Most recently seen on January 17, brought by EMS from [facility] for diarrhea.  CT abdomen pelvis without contrast showed distal transverse colon attempts to herniate through the diaphragm, large hiatal hernia, equivocal for circumferential thickening in the distal small bowel.  No colonic wall thickening.  Rectal exam unremarkable, heme-negative.  Urinalysis consistent with UTI.  Sent home on Keflex. Creatinine 1.77, 4 days prior had been 1.41.  Total bilirubin 1.7, albumin 2.4, white blood cell count 7100, platelets 105,000, hemoglobin 14.5. Urine culture came back positive for greater than 100,000 colony-forming units of E. coli sensitive to Keflex.  She was seen in the ED on January 13 after suffering a fall at [facility].  She hit her head.  She had some dizziness.  She had a skin tear on the left lower leg.  Area was 7 x 4 cm, denuded skin was cleaned with Betadine.  Skin flap was excised.  CT head, cervical spine with no acute abnormalities.  Left tibia/fibula x-ray with no fracture.  Patient presents with her nephew, Elizabeth [name], who is her POA.  He states that patient had reported 10 pound weight loss recently at the nursing home.  She has had steady decline over the past couple of months.  Previously he was able to take her out to eat once every couple of weeks but since Thanksgiving he is not been able to take her out much because she complains of feeling weak.  For the past several weeks she has had intermittent diarrhea.  She has had a diminished appetite.  He reports that  she is getting Imodium off and on at the nursing home.  She is having increasing difficulty ambulating, frequent falls.  Patient really is not able to provide any history.  She is extremely hard of hearing and does have some memory issues.  Nephew is not sure how many times a day she is having diarrhea.  He states her stools have not been checked, she does have history of C. difficile in 2014.  He went to try and walk her over the weekend and noted increasing difficulty ambulating.  By Sunday she was not able to stand up on her own.  She seemed to give out pretty quickly.  He is also concerned about the wound on her left leg.     Pertinent GI history: We last saw patient in 2018 during hospitalization for colitis, suspected to be ischemic colitis.  She also had anemia, unknown baseline.  Plans for outpatient follow-up to determine if colonoscopy needed but she failed to follow-up.  Hemoglobin January 2018 was 8.5.  Iron 8, iron saturation 3%, ferritin 32.  EGD in May 2015, Zenker's diverticulum, cervical esophageal web and Schatzki ring status post dilation, large hiatal hernia.  BPE in November 2015 with large hiatal hernia, esophageal dysmotility, stable Zenker's diverticulum, prominent cricopharyngeus with 13 mm barium tablet staying at this level for 15 to 20 minutes.  Saw Dr. Rubye Oaks at [facility] who is willing to perform Zenker's diverticulectomy, according to patient's  nephew, they decided not to pursue surgery given patient's age.   History of C. difficile colitis in 2014.   Current Outpatient Medications  Medication Sig Dispense Refill  . acetaminophen (TYLENOL) 325 MG tablet Take 2 tablets (650 mg total) by mouth every 6 (six) hours as needed for mild pain (or Fever >/= 101).    . calcium gluconate 500 MG tablet Take 1 tablet by mouth daily.    . cephALEXin (KEFLEX) 500 MG capsule Take 1 capsule (500 mg total) by mouth 4 (four) times daily. 28 capsule 0  . cholecalciferol (VITAMIN D3) 25 MCG  (1000 UT) tablet Take 1,000 Units by mouth daily.    Marland Kitchen ENSURE (ENSURE) Take 237 mLs by mouth 2 (two) times daily between meals.    . Ferrous Fumarate (HEMOCYTE - 106 MG FE) 324 (106 Fe) MG TABS tablet Take 1 tablet (106 mg of iron total) by mouth daily. 30 tablet 5  . levothyroxine (SYNTHROID, LEVOTHROID) 100 MCG tablet Take 100 mcg by mouth daily before breakfast.    . loperamide (IMODIUM A-D) 2 MG tablet Take 2 mg by mouth every 6 (six) hours as needed for diarrhea or loose stools.    . polyethylene glycol (MIRALAX / GLYCOLAX) packet Take 17 g by mouth daily as needed for mild constipation. 14 each 0  . Saccharomyces boulardii (PROBIOTIC) 250 MG CAPS Take 1 capsule by mouth daily.     No current facility-administered medications for this visit.     Allergies as of 08/11/2018  . (No Known Allergies)    Past Medical History:  Diagnosis Date  . Anemia   . Arthritis   . B12 deficiency   . C. difficile colitis Nov 2014   Treated with Flagyl  . Coronary artery disease   . GERD (gastroesophageal reflux disease)   . Hypercholesterolemia   . Hyperlipidemia   . Hypertension   . Hypothyroidism   . Osteoporosis   . Thyroid goiter    s/p surgery now on Synthroid  . Zenker diverticulum     Past Surgical History:  Procedure Laterality Date  . ABDOMINAL HYSTERECTOMY    . APPENDECTOMY    . BUNIONECTOMY    . CORONARY ARTERY BYPASS GRAFT     Status Post four-vessel   . ESOPHAGEAL DILATION    . ESOPHAGOGASTRODUODENOSCOPY  6/10    Schatzki's ring, otherwise endoscopically normal esophagus, status post dilation (21F), with mucosal disruption through the cervical segment as well as the Schatzki's ring. Hiatal hernia. segment as well as the Schatzki's ring. Hiatal hernia.  Marland Kitchen ESOPHAGOGASTRODUODENOSCOPY  09/02/2007   Cervical esophageal web, Schatzki's ring, status post  dilation and disruption as described above. Otherwise, normal esophagus  . ESOPHAGOGASTRODUODENOSCOPY  August 2011    Zenker's diverticulum, Schatzki's ring s/p dilation, mod to large HH, instructions to pursue BPE if recurrent dysphagia to assess Zenker's  . ESOPHAGOGASTRODUODENOSCOPY  03/12/12   Rourk-dilated 3F, cervical web, Schatzki's ring, small HH, small Zenker's diverticulum  . ESOPHAGOGASTRODUODENOSCOPY N/A 11/26/2013   Dr. Jena Gauss: Zenker's diverticulum, cervical esophageal web and Schatzi's ring s/p dilation, large hiatal hernia, consider ENT referral if persistent dysphagia.   Marland Kitchen MALONEY DILATION N/A 11/26/2013   Procedure: Elease Hashimoto DILATION;  Surgeon: Corbin Ade, MD;  Location: [facility];  Service: Endoscopy;  Laterality: N/A;    Family History  Problem Relation Age of Onset  . Colon cancer Neg Hx     Social History   Socioeconomic History  . Marital status: Widowed  Spouse name: Not on file  . Number of children: Not on file  . Years of education: Not on file  . Highest education level: Not on file  Occupational History  . Occupation: retired  Engineer, production  . Financial resource strain: Not on file  . Food insecurity:    Worry: Not on file    Inability: Not on file  . Transportation needs:    Medical: Not on file    Non-medical: Not on file  Tobacco Use  . Smoking status: Never Smoker  . Smokeless tobacco: Never Used  Substance and Sexual Activity  . Alcohol use: No  . Drug use: No  . Sexual activity: Not Currently  Lifestyle  . Physical activity:    Days per week: Not on file    Minutes per session: Not on file  . Stress: Not on file  Relationships  . Social connections:    Talks on phone: Not on file    Gets together: Not on file    Attends religious service: Not on file    Active member of club or organization: Not on file    Attends meetings of clubs or organizations: Not on file    Relationship status: Not on file  . Intimate partner violence:    Fear of current or ex partner: Not on file    Emotionally abused: Not on file    Physically abused: Not on file     Forced sexual activity: Not on file  Other Topics Concern  . Not on file  Social History Narrative  . Not on file      ROS: unable to obtain   Physical Examination:  BP 98/75   Pulse 84   Temp (!) 97 F (36.1 C) (Oral)   Wt 108 lb 12.8 oz (49.4 kg)   BMI 17.56 kg/m    General: Chronically ill-appearing elderly female in no acute distress.  Sitting in wheelchair, appears to not feel well, difficulty sitting upright.  She is able to stand briefly to weigh on the scales.  Complains of feeling significantly weak Head: Normocephalic, atraumatic.   Eyes: Conjunctiva pink, no icterus. Mouth: Oropharyngeal mucosa moist and dry, no lesions erythema or exudate. Neck: Supple without thyromegaly, masses, or lymphadenopathy.  Lungs: Clear to auscultation bilaterally.  Heart: Regular rate and rhythm, no murmurs rubs or gallops.  Abdomen: Bowel sounds are normal, nontender, nondistended, no hepatosplenomegaly or masses, no abdominal bruits or    hernia , no rebound or guarding.   Rectal: not performed Extremities: trace lower extremity edema. Left  lower leg with large wound, pink without erythema some mild serosanguinous drainage, no clubbing or deformities.  Neuro: Alert and oriented x 4 , grossly normal neurologically.  Skin: Warm and dry, no rash or jaundice.   Psych: Alert and cooperative, normal mood and affect.  Labs: Lab Results  Component Value Date   CREATININE 1.42 (H) 08/11/2018   BUN 33 (H) 08/11/2018   NA 136 08/11/2018   K 4.0 08/11/2018   CL 110 08/11/2018   CO2 20 (L) 08/11/2018   Lab Results  Component Value Date   ALT 27 08/11/2018   AST 31 08/11/2018   ALKPHOS 83 08/11/2018   BILITOT 1.1 08/11/2018   Lab Results  Component Value Date   WBC 5.5 08/11/2018   HGB 12.7 08/11/2018   HCT 38.6 08/11/2018   MCV 86.9 08/11/2018   PLT 99 (L) 08/11/2018   Lab Results  Component Value Date  CKTOTAL 50 01/11/2011   CKMB 1.4 01/11/2011   TROPONINI <0.03  08/08/2018     Imaging Studies: Ct Abdomen Pelvis Wo Contrast  Result Date: 08/08/2018 CLINICAL DATA:  Diarrhea.  History of colitis EXAM: CT ABDOMEN AND PELVIS WITHOUT CONTRAST TECHNIQUE: Multidetector CT imaging of the abdomen and pelvis was performed following the standard protocol without IV contrast. COMPARISON:  07/21/2016 FINDINGS: Lower chest: Large hiatal hernia containing most of the stomach, which is partially visualized. Distal transverse colon also attempts to herniate through the diaphragm at this level. Reticulation in the lower lungs which is a combination of both chronic opacity and atelectasis based on prior. Hepatobiliary: No focal liver abnormality.Full gallbladder with no evident inflammation or stone. Pancreas: Generalized atrophy. Spleen: Unremarkable. Adrenals/Urinary Tract: Negative adrenals. No hydronephrosis or stone. Renal atrophy with grade or cortical thinning on the left. Unremarkable bladder. Stomach/Bowel: Equivocal for circumferential wall thickening in the distal small bowel. No obstruction is seen. No colonic wall thickening noted. History of appendectomy. Vascular/Lymphatic: Atherosclerotic calcification. No mass or adenopathy. Reproductive:Hysterectomy Other: Small volume pelvic ascites, presumably reactive in this setting Musculoskeletal: Diffuse spinal degeneration with levoscoliosis. No acute or aggressive finding IMPRESSION: 1. Equivocal for distal enteritis.  No detectable colitis period 2. Small volume and likely reactive ascites. 3. Chronic findings are described above. Electronically Signed   By: Marnee SpringJonathon  Watts M.D.   On: 08/08/2018 11:44   Dg Chest 2 View  Result Date: 08/08/2018 CLINICAL DATA:  Weakness and diarrhea EXAM: CHEST - 2 VIEW COMPARISON:  August 04, 2018 chest radiograph. Chest CT July 19, 2012 FINDINGS: There is atelectatic change in the left base. There is no frank edema or consolidation. Heart is upper normal in size with pulmonary  vascularity normal. There is a sizable hiatal hernia. Patient is status post coronary artery bypass grafting. There is aortic atherosclerosis. Bones are diffusely osteoporotic. IMPRESSION: Left base atelectasis. No frank consolidation. Heart upper normal in size with postoperative changes and aortic atherosclerosis. Bones osteoporotic. Aortic Atherosclerosis (ICD10-I70.0). Electronically Signed   By: Bretta BangWilliam  Woodruff III M.D.   On: 08/08/2018 11:37   Dg Chest 2 View  Result Date: 08/04/2018 CLINICAL DATA:  85 year old female status post fall with lower leg laceration. EXAM: CHEST - 2 VIEW COMPARISON:  Chest radiographs 11/02/2014 and earlier. FINDINGS: Upright AP and lateral views. Chronic hiatal hernia, CABG, Calcified aortic atherosclerosis. No acute osseous abnormality identified. Stable lung volumes. No pneumothorax, pleural effusion or consolidation. Increased pulmonary vascularity. Paucity of bowel gas in the upper abdomen. IMPRESSION: 1. Increased pulmonary vascularity, consider mild or developing interstitial edema. No pleural effusion. 2. Chronic hiatal hernia and Aortic Atherosclerosis (ICD10-I70.0). Electronically Signed   By: Odessa FlemingH  Hall M.D.   On: 08/04/2018 16:32   Dg Tibia/fibula Left  Result Date: 08/04/2018 CLINICAL DATA:  85 year old female status post fall with lower leg laceration. EXAM: LEFT TIBIA AND FIBULA - 2 VIEW COMPARISON:  None. FINDINGS: Dressing material in place along the lateral lower tib fib. No radiopaque foreign body identified. No soft tissue gas identified. No acute osseous abnormality identified. Degenerative changes at the left knee. Mild for age IMPRESSION: 1. No acute osseous abnormality identified. 2. Soft tissue injury with no radiopaque foreign body. Electronically Signed   By: Odessa FlemingH  Hall M.D.   On: 08/04/2018 16:28   Ct Head Wo Contrast  Result Date: 08/04/2018 CLINICAL DATA:  Dizziness and fall EXAM: CT HEAD WITHOUT CONTRAST CT CERVICAL SPINE WITHOUT CONTRAST  TECHNIQUE: Multidetector CT imaging of the head and cervical spine  was performed following the standard protocol without intravenous contrast. Multiplanar CT image reconstructions of the cervical spine were also generated. COMPARISON:  11/02/2014 FINDINGS: CT HEAD FINDINGS Brain: There is no mass, hemorrhage or extra-axial collection. There is generalized atrophy without lobar predilection. There is hypoattenuation of the periventricular white matter, most commonly indicating chronic ischemic microangiopathy. Vascular: Atherosclerotic calcification of the vertebral and internal carotid arteries at the skull base. No abnormal hyperdensity of the major intracranial arteries or dural venous sinuses. Skull: The visualized skull base, calvarium and extracranial soft tissues are normal. Sinuses/Orbits: No fluid levels or advanced mucosal thickening of the visualized paranasal sinuses. No mastoid or middle ear effusion. The orbits are normal. CT CERVICAL SPINE FINDINGS Alignment: Grade 1 anterolisthesis at C4-5, unchanged. Facets are normally aligned. Skull base and vertebrae: No acute fracture. Soft tissues and spinal canal: No prevertebral fluid or swelling. No visible canal hematoma. Disc levels: Multilevel severe facet hypertrophy with foraminal narrowing greatest at right C5-6 and left C6-7. Upper chest: No pneumothorax, pulmonary nodule or pleural effusion. Other: Normal visualized paraspinal cervical soft tissues. IMPRESSION: 1. Atrophy and chronic ischemic microangiopathy without acute intracranial abnormality. 2. No acute fracture of the cervical spine. Electronically Signed   By: Deatra Robinson M.D.   On: 08/04/2018 17:53   Ct Cervical Spine Wo Contrast  Result Date: 08/04/2018 CLINICAL DATA:  Dizziness and fall EXAM: CT HEAD WITHOUT CONTRAST CT CERVICAL SPINE WITHOUT CONTRAST TECHNIQUE: Multidetector CT imaging of the head and cervical spine was performed following the standard protocol without intravenous  contrast. Multiplanar CT image reconstructions of the cervical spine were also generated. COMPARISON:  11/02/2014 FINDINGS: CT HEAD FINDINGS Brain: There is no mass, hemorrhage or extra-axial collection. There is generalized atrophy without lobar predilection. There is hypoattenuation of the periventricular white matter, most commonly indicating chronic ischemic microangiopathy. Vascular: Atherosclerotic calcification of the vertebral and internal carotid arteries at the skull base. No abnormal hyperdensity of the major intracranial arteries or dural venous sinuses. Skull: The visualized skull base, calvarium and extracranial soft tissues are normal. Sinuses/Orbits: No fluid levels or advanced mucosal thickening of the visualized paranasal sinuses. No mastoid or middle ear effusion. The orbits are normal. CT CERVICAL SPINE FINDINGS Alignment: Grade 1 anterolisthesis at C4-5, unchanged. Facets are normally aligned. Skull base and vertebrae: No acute fracture. Soft tissues and spinal canal: No prevertebral fluid or swelling. No visible canal hematoma. Disc levels: Multilevel severe facet hypertrophy with foraminal narrowing greatest at right C5-6 and left C6-7. Upper chest: No pneumothorax, pulmonary nodule or pleural effusion. Other: Normal visualized paraspinal cervical soft tissues. IMPRESSION: 1. Atrophy and chronic ischemic microangiopathy without acute intracranial abnormality. 2. No acute fracture of the cervical spine. Electronically Signed   By: Deatra Robinson M.D.   On: 08/04/2018 17:53    Impression/plan: Very pleasant 85 year old female, resident at [facility], who presents to the office for further evaluation of diarrhea with her nephew and power of attorney, Elizabeth [name].  Patient reportedly has had intermittent diarrhea off and on for several weeks.  She is extremely hard of hearing and has some memory deficits therefore is not a great historian.  In fact she denies diarrhea today.  Seen recently in  the ED a few days ago for diarrhea.  No stool studies done today.  Diagnosed with UTI and started on Keflex.  Noted to have a decline in BUN/creatinine from several days prior when she presented with fall.  She suffered injury to the left lower leg with large skin  tear.  Mr. Weston Annallington reports that patient has had decline in oral intake over several weeks.  She has had increasing falls, feels dizzy at times.  Over the past 2 days she is not been able to get out of her chair on her own.  At baseline typically is able to ambulate some.  Noted to have low normal blood pressure down from her baseline.  Given decline in oral intake, rising creatinine, there was concern for dehydration.  I discussed case with Dr. Sherryll BurgerShah it was felt like the patient should be considered for admission further work-up of her diarrhea, management of dehydration.  We will follow patient during her hospitalization.  We will plan on stool studies.

## 2018-08-11 NOTE — H&P (Signed)
History and Physical    Elizabeth Feystelle L Seaman UEA:[id number] DOB: August 01, 1918 DOA: 08/11/2018  PCP: Patient, No Pcp Per   Patient coming from: [facility]  Chief Complaint: Worsening weakness and lack of appetite in setting of chronic, intermittent diarrhea  HPI: Elizabeth [name] is a 85 y.o. female with medical history significant for hypertension, dyslipidemia, hypothyroidism, osteoporosis, CAD, and chronic diarrhea who has been admitted directly from GI office on account of some worsening weakness and lack of appetite that has been manifesting in the setting of chronic, intermittent diarrhea.  She appears to be dehydrated and was recently seen in the ED on 1/17 and was noted to have UTI at that time.  She was given 1 dose of IV Rocephin and sent home on Keflex.  Her creatinine at that time was noted to be elevated at 1.77.  Urine culture did show positivity for E. coli sensitive to Keflex.  Nephew at bedside states that patient has lost approximately 10 pounds and continues to have significant weakness and as a result, many falls with now a wound to her left leg.  Her laboratory data has been reviewed from the ED visit on 1/17.   Review of Systems: Cannot be obtained due to patient condition-hard of hearing.  Past Medical History:  Diagnosis Date  . Anemia   . Arthritis   . B12 deficiency   . C. difficile colitis Nov 2014   Treated with Flagyl  . Coronary artery disease   . GERD (gastroesophageal reflux disease)   . Hypercholesterolemia   . Hyperlipidemia   . Hypertension   . Hypothyroidism   . Osteoporosis   . Thyroid goiter    s/p surgery now on Synthroid  . Zenker diverticulum     Past Surgical History:  Procedure Laterality Date  . ABDOMINAL HYSTERECTOMY    . APPENDECTOMY    . BUNIONECTOMY    . CORONARY ARTERY BYPASS GRAFT     Status Post four-vessel   . ESOPHAGEAL DILATION    . ESOPHAGOGASTRODUODENOSCOPY  6/10    Schatzki's ring, otherwise endoscopically normal esophagus,  status post dilation (58F), with mucosal disruption through the cervical segment as well as the Schatzki's ring. Hiatal hernia. segment as well as the Schatzki's ring. Hiatal hernia.  Marland Kitchen. ESOPHAGOGASTRODUODENOSCOPY  09/02/2007   Cervical esophageal web, Schatzki's ring, status post  dilation and disruption as described above. Otherwise, normal esophagus  . ESOPHAGOGASTRODUODENOSCOPY  August 2011   Zenker's diverticulum, Schatzki's ring s/p dilation, mod to large HH, instructions to pursue BPE if recurrent dysphagia to assess Zenker's  . ESOPHAGOGASTRODUODENOSCOPY  03/12/12   Rourk-dilated 31F, cervical web, Schatzki's ring, small HH, small Zenker's diverticulum  . ESOPHAGOGASTRODUODENOSCOPY N/A 11/26/2013   Dr. Jena Gaussourk: Zenker's diverticulum, cervical esophageal web and Schatzi's ring s/p dilation, large hiatal hernia, consider ENT referral if persistent dysphagia.   Marland Kitchen. MALONEY DILATION N/A 11/26/2013   Procedure: Elease HashimotoMALONEY DILATION;  Surgeon: Corbin Adeobert M Rourk, MD;  Location: [facility];  Service: Endoscopy;  Laterality: N/A;     reports that she has never smoked. She has never used smokeless tobacco. She reports that she does not drink alcohol or use drugs.  No Known Allergies  Family History  Problem Relation Age of Onset  . Colon cancer Neg Hx     Prior to Admission medications   Medication Sig Start Date End Date Taking? Authorizing Provider  acetaminophen (TYLENOL) 325 MG tablet Take 2 tablets (650 mg total) by mouth every 6 (six) hours as needed for  mild pain (or Fever >/= 101). 08/02/16   [name], MD  calcium gluconate 500 MG tablet Take 1 tablet by mouth daily.    [provider]  cephALEXin (KEFLEX) 500 MG capsule Take 1 capsule (500 mg total) by mouth 4 (four) times daily. 08/08/18   Vanetta Mulders, MD  cholecalciferol (VITAMIN D3) 25 MCG (1000 UT) tablet Take 1,000 Units by mouth daily.    [provider]  ENSURE (ENSURE) Take 237 mLs by mouth 2 (two) times daily  between meals.    [provider]  Ferrous Fumarate (HEMOCYTE - 106 MG FE) 324 (106 Fe) MG TABS tablet Take 1 tablet (106 mg of iron total) by mouth daily. 07/25/16   [name], MD  levothyroxine (SYNTHROID, LEVOTHROID) 100 MCG tablet Take 100 mcg by mouth daily before breakfast.    [provider]  loperamide (IMODIUM A-D) 2 MG tablet Take 2 mg by mouth every 6 (six) hours as needed for diarrhea or loose stools.    [provider]  polyethylene glycol (MIRALAX / GLYCOLAX) packet Take 17 g by mouth daily as needed for mild constipation. 08/02/16   [name], MD  Saccharomyces boulardii (PROBIOTIC) 250 MG CAPS Take 1 capsule by mouth daily.    [provider]    Physical Exam: Vitals:   08/11/18 1200  BP: 132/82  Pulse: 72  Resp: 16  Temp: (!) 97 F (36.1 C)  TempSrc: Oral  SpO2: 94%  Weight: 54.3 kg    Constitutional: NAD, calm, comfortable, frail-appearing Vitals:   08/11/18 1200  BP: 132/82  Pulse: 72  Resp: 16  Temp: (!) 97 F (36.1 C)  TempSrc: Oral  SpO2: 94%  Weight: 54.3 kg   Eyes: lids and conjunctivae normal ENMT: Mucous membranes are dry.  Neck: normal, supple Respiratory: clear to auscultation bilaterally. Normal respiratory effort. No accessory muscle use.  Cardiovascular: Regular rate and rhythm, no murmurs. No extremity edema. Abdomen: no tenderness, no distention. Bowel sounds positive.  Musculoskeletal:  No joint deformity upper and lower extremities.   Skin: no rashes, lesions, ulcers.  Left ankle skin tear noted with some serosanguineous oozing. Psychiatric: Cannot be properly assessed.  Labs on Admission: I have personally reviewed following labs and imaging studies  CBC: Recent Labs  Lab 08/04/18 1641 08/08/18 1156  WBC 6.5 7.1  NEUTROABS 4.6 5.3  HGB 14.4 14.5  HCT 44.8 44.1  MCV 91.4 89.6  PLT 94* 105*   Basic Metabolic Panel: Recent Labs  Lab 08/04/18 1641 08/08/18 1156  NA 136 135  K  4.0 4.1  CL 105 103  CO2 22 22  GLUCOSE 94 91  BUN 31* 42*  CREATININE 1.31* 1.77*  CALCIUM 7.5* 8.0*   GFR: Estimated Creatinine Clearance: 15.6 mL/min (A) (by C-G formula based on SCr of 1.77 mg/dL (H)). Liver Function Tests: Recent Labs  Lab 08/08/18 1156  AST 34  ALT 30  ALKPHOS 101  BILITOT 1.7*  PROT 5.7*  ALBUMIN 2.4*   Recent Labs  Lab 08/08/18 1156  LIPASE 16   No results for input(s): AMMONIA in the last 168 hours. Coagulation Profile: No results for input(s): INR, PROTIME in the last 168 hours. Cardiac Enzymes: Recent Labs  Lab 08/04/18 1641 08/08/18 1156  TROPONINI <0.03 <0.03   BNP (last 3 results) No results for input(s): PROBNP in the last 8760 hours. HbA1C: No results for input(s): HGBA1C in the last 72 hours. CBG: No results for input(s): GLUCAP in the last 168  hours. Lipid Profile: No results for input(s): CHOL, HDL, LDLCALC, TRIG, CHOLHDL, LDLDIRECT in the last 72 hours. Thyroid Function Tests: No results for input(s): TSH, T4TOTAL, FREET4, T3FREE, THYROIDAB in the last 72 hours. Anemia Panel: No results for input(s): VITAMINB12, FOLATE, FERRITIN, TIBC, IRON, RETICCTPCT in the last 72 hours. Urine analysis:    Component Value Date/Time   COLORURINE AMBER (A) 08/08/2018 1800   APPEARANCEUR CLOUDY (A) 08/08/2018 1800   LABSPEC 1.017 08/08/2018 1800   PHURINE 5.0 08/08/2018 1800   GLUCOSEU NEGATIVE 08/08/2018 1800   HGBUR LARGE (A) 08/08/2018 1800   BILIRUBINUR NEGATIVE 08/08/2018 1800   KETONESUR 5 (A) 08/08/2018 1800   PROTEINUR NEGATIVE 08/08/2018 1800   UROBILINOGEN 0.2 11/02/2014 1025   NITRITE POSITIVE (A) 08/08/2018 1800   LEUKOCYTESUR MODERATE (A) 08/08/2018 1800    Radiological Exams on Admission: No results found.  Assessment/Plan Principal Problem:   Diarrhea Active Problems:   Dehydration   HTN (hypertension), benign   Hypothyroidism   Physical deconditioning   AKI (acute kidney injury) (HCC)   Acute lower UTI      1. Weakness secondary to AKI on CKD stage III.  This appears to be related to worsening dehydration in the setting of poor appetite along with ongoing diarrhea.  Reviewed lab data from 1/17 with laboratory data today pending.  Will maintain on LR for IV fluid hydration and monitor repeat labs along with daily weights and strict I's and O's.  Avoid nephrotoxic agents.  PT evaluation as well as palliative care involvement due to worsening frailty and poor prognosis for survival long-term.  Maintain on fall precautions. 2. E. coli UTI.  Continue treatment with oral Keflex as previously prescribed for full 7-day course. 3. Chronic, intermittent diarrhea.  Appreciate GI evaluation with recommendations to check C. difficile levels. 4. Hypertension.  Avoid home blood pressure agents due to soft blood pressure readings at this time. 5. Hypothyroidism.  Maintain on Synthroid.   DVT prophylaxis: SCDs Code Status: DNR Family Communication: Spoke with nephew at bedside Disposition Plan: Admit for IV hydration as well as PT evaluation and palliative consultation.  Ongoing work-up for chronic diarrhea by GI.  Continue treatment for UTI. Consults called: GI, palliative care Admission status: Inpatient, MedSurg   Wende Longstreth Hoover BrunetteD Kier Smead DO [facility] Pager [phone]  If 7PM-7AM, please contact night-coverage [email or web address] Password [email or web address]  08/11/2018, 1:25 PM

## 2018-08-11 NOTE — Progress Notes (Signed)
Palliative Medicine consult order noted. PMT provider will return to [facility] on 08/12/2018. If the patient remains hospitalized, the consult will be evaluated at that time. If recommendations are needed in the interim, please call our office at [phone].   Margret Chance Jakeya Gherardi, RN, BSN, [facility] Palliative Medicine Team 08/11/2018 2:40 PM Office [phone]

## 2018-08-11 NOTE — Consult Note (Signed)
WOC Nurse wound consult note Reason for Consult:Skin tear to left lower leg Wound type:trauma Pressure Injury POA: NA Measurement: 6.5 cm x 4 cm x 0.2 cm skin tear with defect noted.   Wound bed:red and moist Drainage (amount, consistency, odor) minimal serosanguinous  Periwound: thin skin Dressing procedure/placement/frequency:Cleanse wound to left lower leg with NS. Apply mupirocin ointment and cover with gauze and kerlix/tape.  Change daily. Will not follow at this time.  Please re-consult if needed.  Maple HudsonKaren Rodert Hinch MSN, RN, FNP-BC CWON Wound, Ostomy, Continence Nurse Pager [phone]

## 2018-08-12 ENCOUNTER — Telehealth: Payer: Self-pay

## 2018-08-12 ENCOUNTER — Telehealth: Payer: Self-pay | Admitting: Gastroenterology

## 2018-08-12 DIAGNOSIS — N179 Acute kidney failure, unspecified: Secondary | ICD-10-CM | POA: Diagnosis not present

## 2018-08-12 DIAGNOSIS — K529 Noninfective gastroenteritis and colitis, unspecified: Secondary | ICD-10-CM | POA: Diagnosis not present

## 2018-08-12 DIAGNOSIS — R197 Diarrhea, unspecified: Secondary | ICD-10-CM | POA: Diagnosis not present

## 2018-08-12 DIAGNOSIS — E860 Dehydration: Secondary | ICD-10-CM | POA: Diagnosis not present

## 2018-08-12 LAB — CBC
HEMATOCRIT: 41.8 % (ref 36.0–46.0)
Hemoglobin: 13.5 g/dL (ref 12.0–15.0)
MCH: 28.5 pg (ref 26.0–34.0)
MCHC: 32.3 g/dL (ref 30.0–36.0)
MCV: 88.4 fL (ref 80.0–100.0)
NRBC: 0 % (ref 0.0–0.2)
Platelets: 102 10*3/uL — ABNORMAL LOW (ref 150–400)
RBC: 4.73 MIL/uL (ref 3.87–5.11)
RDW: 17.4 % — ABNORMAL HIGH (ref 11.5–15.5)
WBC: 5.9 10*3/uL (ref 4.0–10.5)

## 2018-08-12 LAB — BASIC METABOLIC PANEL
Anion gap: 8 (ref 5–15)
BUN: 29 mg/dL — ABNORMAL HIGH (ref 8–23)
CO2: 25 mmol/L (ref 22–32)
Calcium: 7.5 mg/dL — ABNORMAL LOW (ref 8.9–10.3)
Chloride: 104 mmol/L (ref 98–111)
Creatinine, Ser: 1.36 mg/dL — ABNORMAL HIGH (ref 0.44–1.00)
GFR calc Af Amer: 38 mL/min — ABNORMAL LOW (ref >60)
GFR calc non Af Amer: 33 mL/min — ABNORMAL LOW (ref >60)
Glucose, Bld: 90 mg/dL (ref 70–99)
Potassium: 4.1 mmol/L (ref 3.5–5.1)
SODIUM: 137 mmol/L (ref 135–145)

## 2018-08-12 LAB — MAGNESIUM: Magnesium: 2.2 mg/dL (ref 1.7–2.4)

## 2018-08-12 NOTE — Telephone Encounter (Signed)
Please arrange hospital follow-up in next 2-4 weeks. Thanks!

## 2018-08-12 NOTE — Progress Notes (Signed)
Physical Therapy Treatment Patient Details Name: Elizabeth [name] MRN: [id number] DOB: Dec 12, 1918 Today's Date: 08/12/2018    History of Present Illness Elizabeth [name] is a 85 y.o. female with medical history significant for hypertension, dyslipidemia, hypothyroidism, osteoporosis, CAD, and chronic diarrhea who has been admitted directly from GI office on account of some worsening weakness and lack of appetite that has been manifesting in the setting of chronic, intermittent diarrhea.  She appears to be dehydrated and was recently seen in the ED on 1/17 and was noted to have UTI at that time.  She was given 1 dose of IV Rocephin and sent home on Keflex.  Her creatinine at that time was noted to be elevated at 1.77.  Urine culture did show positivity for E. coli sensitive to Keflex.  Nephew at bedside states that patient has lost approximately 10 pounds and continues to have significant weakness and as a result, many falls with now a wound to her left leg.  Her laboratory data has been reviewed from the ED visit on 1/17.    PT Comments    Patient presents supine in bed, pleasant, and agreeable to therapy. Patient requires min assist with supine to sit using bed rail and requiring increased time. Patient performs sit to/from transfers and stand pivot transfers with RW requiring increased time, verbal cues for hand placement to improve safety, and with mild shortness of breath. Patient with poor safety awareness requiring education on hand placement with occasional tactile cues. Patient ambulates with RW 10 ft in room to restroom demonstrating unsteady gait, occasional shuffling step progression, and requires min assist with RW management. Patient limited by fatigue demonstrating mild shortness of breath with ambulation and transfers. Patient left in chair with bil lower extremities elevated, chair alarm on, and call bell in lap. Patient will benefit from continued physical therapy in hospital and recommended  venue below.   Follow Up Recommendations  Home health PT;Supervision/Assistance - 24 hour;Supervision for mobility/OOB     Equipment Recommendations  None recommended by PT    Recommendations for Other Services       Precautions / Restrictions Precautions Precautions: Fall Restrictions Weight Bearing Restrictions: No    Mobility  Bed Mobility Overal bed mobility: Needs Assistance Bed Mobility: Supine to Sit     Supine to sit: Min assist     General bed mobility comments: increased time, use of bed rail  Transfers Overall transfer level: Needs assistance Equipment used: Rolling walker (2 wheeled) Transfers: Sit to/from [employer] Sit to Stand: Min assist Stand pivot transfers: Min assist       General transfer comment: increased time, unsteadiness on feet, verbal cues for hand placement and safety  Ambulation/Gait Ambulation/Gait assistance: Min assist Gait Distance (Feet): 10 Feet Assistive device: Rolling walker (2 wheeled) Gait Pattern/deviations: Decreased step length - right;Decreased step length - left;Decreased stride length;Trunk flexed Gait velocity: decreased   General Gait Details: flat foot posture with unsteady bil foot progression, occasional shuffling foot progression, unsteadiness throughout gait cycle requiring min assist for steadying and to prevent loss of balance   Stairs             Wheelchair Mobility    Modified Rankin (Stroke Patients Only)       Balance Overall balance assessment: Needs assistance Sitting-balance support: Feet supported;Bilateral upper extremity supported Sitting balance-Leahy Scale: Fair     Standing balance support: During functional activity;Bilateral upper extremity supported Standing balance-Leahy Scale: Poor Standing balance comment: fair/poor with RW  Cognition Arousal/Alertness: Awake/alert Behavior During Therapy: WFL for tasks  assessed/performed Overall Cognitive Status: Within Functional Limits for tasks assessed                                        Exercises      General Comments        Pertinent Vitals/Pain Pain Assessment: No/denies pain    Home Living                      Prior Function            PT Goals (current goals can now be found in the care plan section) Acute Rehab PT Goals Patient Stated Goal: return home/ALF PT Goal Formulation: With patient Time For Goal Achievement: 08/18/18 Potential to Achieve Goals: Good Progress towards PT goals: Progressing toward goals    Frequency    Min 2X/week      PT Plan Current plan remains appropriate    Co-evaluation              AM-PAC PT "6 Clicks" Mobility   Outcome Measure  Help needed turning from your back to your side while in a flat bed without using bedrails?: A Little Help needed moving from lying on your back to sitting on the side of a flat bed without using bedrails?: A Little Help needed moving to and from a bed to a chair (including a wheelchair)?: A Little Help needed standing up from a chair using your arms (e.g., wheelchair or bedside chair)?: A Little Help needed to walk in hospital room?: A Little Help needed climbing 3-5 steps with a railing? : A Lot 6 Click Score: 17    End of Session   Activity Tolerance: Patient tolerated treatment well;Patient limited by fatigue Patient left: in chair;with call bell/phone within reach;with chair alarm set Nurse Communication: Mobility status PT Visit Diagnosis: Unsteadiness on feet (R26.81);Other abnormalities of gait and mobility (R26.89);Muscle weakness (generalized) (M62.81)     Time: 1040-1100 PT Time Calculation (min) (ACUTE ONLY): 20 min  Charges:  $Therapeutic Activity: 8-22 mins                     11:13 AM, 08/12/18 Domenick Bookbinderori Palin Tristan, PT, DPT Physical Therapist with [facility] [phone] mobile  phone

## 2018-08-12 NOTE — Clinical Social Work Note (Signed)
Pt stable for dc today per MD. Pt resides at [facility]. Updated staff at [facility]. They will transport pt. Spoke with pt's nephew by phone to update. He is in agreement with pt returning to [place]. Spoke with pt's RN who is aware of dc plan. DC clinical sent through Epic hub. There are no other CSW needs for dc.

## 2018-08-12 NOTE — Telephone Encounter (Signed)
Post ED Visit - Positive Culture Follow-up  Culture report reviewed by antimicrobial stewardship pharmacist:  []  Enzo BiNathan Batchelder, Pharm.D. []  Celedonio MiyamotoJeremy Frens, [address] BoulevardPharm.D., BCPS AQ-ID []  Garvin FilaMike Maccia, Pharm.D., BCPS []  Georgina PillionElizabeth Martin, Pharm.D., BCPS []  White OakMinh Pham, VermontPharm.D., BCPS, AAHIVP []  Estella HuskMichelle Turner, Pharm.D., BCPS, AAHIVP [x]  Lysle Pearlachel Rumbarger, PharmD, BCPS []  Phillips Climeshuy Dang, PharmD, BCPS []  Agapito GamesAlison Masters, PharmD, BCPS []  Verlan FriendsErin Deja, PharmD  Positive urine culture Treated with Cephalexin, organism sensitive to the same and no further patient follow-up is required at this time.  Jerry CarasCullom, Timmy Bubeck Burnett 08/12/2018, 7:53 AM

## 2018-08-12 NOTE — Progress Notes (Signed)
Spoke with staff member from [facility], they will provide transportation for patient's discharge.

## 2018-08-12 NOTE — NC FL2 (Signed)
[place] MEDICAID FL2 LEVEL OF CARE SCREENING TOOL     IDENTIFICATION  Patient Name: Elizabeth [name] Birthdate: July 25, 1918 Sex: female Admission Date (Current Location): 08/11/2018  [facility] and [place] Number:  [employer] and Address:  [facility],  [address], [place] [address]      Provider Number: [id number]  Attending Physician Name and Address:  Erick Blinks, DO  Relative Name and Phone Number:       Current Level of Care: Hospital Recommended Level of Care: Assisted Living Facility Prior Approval Number:    Date Approved/Denied:   PASRR Number:    Discharge Plan: Other (Comment)(ALF)    Current Diagnoses: Patient Active Problem List   Diagnosis Date Noted  . AKI (acute kidney injury) (HCC) 08/11/2018  . Acute lower UTI 08/11/2018  . Hypokalemia 07/30/2016  . Physical deconditioning 07/30/2016  . Generalized weakness   . Iron deficiency anemia due to chronic blood loss   . Rectal bleeding 10/16/2014  . Anemia 10/16/2014  . Oropharyngeal dysphagia 11/02/2013  . Clostridium difficile colitis 06/01/2013  . Acute colitis 05/29/2013  . Hematochezia 05/29/2013  . HTN (hypertension), benign 05/29/2013  . Hypothyroidism 05/29/2013  . Hypotension, unspecified 05/29/2013  . ARF (acute renal failure) (HCC) 05/29/2013  . Anemia due to acute blood loss 05/29/2013  . Ischemic colitis (HCC) 09/02/2012  . Colitis 07/21/2012  . Viral syndrome 07/19/2012  . CAP (community acquired pneumonia), possibly 07/19/2012  . Dehydration 07/19/2012  . Diarrhea 07/19/2012  . Hyponatremia 07/19/2012  . GERD 02/27/2010  . Dysphagia 02/27/2010  . ESOPHAGEAL STRICTURE 11/30/2008    Orientation RESPIRATION BLADDER Height & Weight     Place  Normal Incontinent Weight: 119 lb 11.4 oz (54.3 kg) Height:     BEHAVIORAL SYMPTOMS/MOOD NEUROLOGICAL BOWEL NUTRITION STATUS      Continent Diet  AMBULATORY STATUS COMMUNICATION OF NEEDS Skin   Independent  Verbally Other (Comment)(Skin tear, left leg. foam dressing changed daily)                       Personal Care Assistance Level of Assistance  Bathing, Feeding, Dressing Bathing Assistance: Limited assistance Feeding assistance: Independent Dressing Assistance: Limited assistance     Functional Limitations Info  Sight, Hearing, Speech Sight Info: Adequate Hearing Info: Impaired Speech Info: Adequate    SPECIAL CARE FACTORS FREQUENCY  PT (By licensed PT)     PT Frequency: HH PT 2-3 times week              Contractures Contractures Info: Not present    Additional Factors Info  Code Status, Allergies Code Status Info: DNR Allergies Info: NKA           Current Medications (08/12/2018):  This is the current hospital active medication list Current Facility-Administered Medications  Medication Dose Route Frequency Provider Last Rate Last Dose  . acetaminophen (TYLENOL) tablet 650 mg  650 mg Oral Q6H PRN Sherryll Burger, Pratik D, DO      . calcium carbonate (TUMS - dosed in mg elemental calcium) chewable tablet 100 mg of elemental calcium  0.5 tablet Oral Daily Sherryll Burger, Pratik D, DO   100 mg of elemental calcium at 08/12/18 0908  . cephALEXin (KEFLEX) capsule 250 mg  250 mg Oral Q12H Shah, Pratik D, DO   250 mg at 08/11/18 2212  . cholecalciferol (VITAMIN D3) tablet 1,000 Units  1,000 Units Oral Daily Maurilio Lovely D, DO   1,000 Units at 08/12/18 0908  .  feeding supplement (ENSURE ENLIVE) (ENSURE ENLIVE) liquid 237 mL  237 mL Oral BID BM Shah, Pratik D, DO   237 mL at 08/12/18 0908  . Ferrous Fumarate (HEMOCYTE - 106 mg FE) tablet 106 mg of iron  1 tablet Oral Daily Sherryll BurgerShah, Pratik D, DO   106 mg of iron at 08/12/18 0908  . lactated ringers infusion   Intravenous Continuous Maurilio LovelyShah, Pratik D, DO   Stopped at 08/12/18 [phone]  . levothyroxine (SYNTHROID, LEVOTHROID) tablet 100 mcg  100 mcg Oral QAC breakfast Maurilio LovelyShah, Pratik D, DO   100 mcg at 08/12/18 0529  . loperamide (IMODIUM) capsule 2 mg  2 mg  Oral Q6H PRN Sherryll BurgerShah, Pratik D, DO      . mupirocin ointment (BACTROBAN) 2 %   Topical Daily Sherryll BurgerShah, Pratik D, DO      . ondansetron (ZOFRAN) tablet 4 mg  4 mg Oral Q6H PRN Sherryll BurgerShah, Pratik D, DO       Or  . ondansetron (ZOFRAN) injection 4 mg  4 mg Intravenous Q6H PRN Sherryll BurgerShah, Pratik D, DO      . saccharomyces boulardii (FLORASTOR) capsule   Oral Daily Sherryll BurgerShah, Pratik D, DO   250 mg at 08/12/18 0908     Discharge Medications:  TAKE these medications   acetaminophen 325 MG tablet Commonly known as:  TYLENOL Take 2 tablets (650 mg total) by mouth every 6 (six) hours as needed for mild pain (or Fever >/= 101).   calcium gluconate 500 MG tablet Take 1 tablet by mouth daily.   cephALEXin 500 MG capsule Commonly known as:  KEFLEX Take 1 capsule (500 mg total) by mouth 4 (four) times daily.   cholecalciferol 25 MCG (1000 UT) tablet Commonly known as:  VITAMIN D3 Take 1,000 Units by mouth daily.   ENSURE Take 237 mLs by mouth 2 (two) times daily between meals.   Ferrous Fumarate 324 (106 Fe) MG Tabs tablet Commonly known as:  HEMOCYTE - 106 mg FE Take 1 tablet (106 mg of iron total) by mouth daily.   levothyroxine 100 MCG tablet Commonly known as:  SYNTHROID, LEVOTHROID Take 100 mcg by mouth daily before breakfast.   loperamide 2 MG tablet Commonly known as:  IMODIUM A-D Take 2 mg by mouth every 6 (six) hours as needed for diarrhea or loose stools.   polyethylene glycol packet Commonly known as:  MIRALAX / GLYCOLAX Take 17 g by mouth daily as needed for mild constipation.   Probiotic 250 MG Caps Take 1 capsule by mouth daily.        Relevant Imaging Results:  Relevant Lab Results:   Additional Information    Elliot GaultKathleen Brycin Kille, LCSW

## 2018-08-12 NOTE — Discharge Summary (Signed)
Physician Discharge Summary  Elizabeth [name] UJW:[id number] DOB: 1919-06-29 DOA: 08/11/2018  PCP: Patient, No Pcp Per  Admit date: 08/11/2018  Discharge date: 08/12/2018  Admitted From:ALF  Disposition:  ALF  Recommendations for Outpatient Follow-up:  1. Follow up with PCP in 1-2 weeks 2. Follow-up with GI as scheduled for management and evaluation of chronic diarrhea  Home Health: Yes with PT  Equipment/Devices: None  Discharge Condition: Stable  CODE STATUS: DNR  Diet recommendation: Heart Healthy  Brief/Interim Summary: Per HPI: Elizabeth [name] is a 85 y.o. female with medical history significant for hypertension, dyslipidemia, hypothyroidism, osteoporosis, CAD, and chronic diarrhea who has been admitted directly from GI office on account of some worsening weakness and lack of appetite that has been manifesting in the setting of chronic, intermittent diarrhea.  She appears to be dehydrated and was recently seen in the ED on 1/17 and was noted to have UTI at that time.  She was given 1 dose of IV Rocephin and sent home on Keflex.  Her creatinine at that time was noted to be elevated at 1.77.  Urine culture did show positivity for E. coli sensitive to Keflex.  Nephew at bedside states that patient has lost approximately 10 pounds and continues to have significant weakness and as a result, many falls with now a wound to her left leg.  Her laboratory data has been reviewed from the ED visit on 1/17.  Patient was directly admitted from the GI office on account of some worsening weakness that was attributed to some dehydration with AKI on CKD stage III which was noted on lab work with recent ED visit on 1/17.  She has had significant improvement in her creatinine levels overall even on admission and was given some IV fluid with further improvement noted this morning with creatinine of 1.36 on day of discharge.  She appears to be back to her usual baseline and has been seen by PT with  recommendations for home health PT at her assisted living facility.  She is to continue on Keflex as previously prescribed for her E. coli UTI and follow-up with GI regarding her chronic, intermittent diarrhea.  Notably, she was seen by GI while here and she has not had any episodes of diarrhea.  Resume usual home medications.  No further acute events during the course of this admission.  Discharge Diagnoses:  Principal Problem:   Diarrhea Active Problems:   Dehydration   HTN (hypertension), benign   Hypothyroidism   Physical deconditioning   AKI (acute kidney injury) (HCC)   Acute lower UTI  Principal discharge diagnosis: Weakness secondary to dehydration with AKI on CKD stage III.  Discharge Instructions  Discharge Instructions    Diet - low sodium heart healthy   Complete by:  As directed    Increase activity slowly   Complete by:  As directed      Allergies as of 08/12/2018   No Known Allergies     Medication List    TAKE these medications   acetaminophen 325 MG tablet Commonly known as:  TYLENOL Take 2 tablets (650 mg total) by mouth every 6 (six) hours as needed for mild pain (or Fever >/= 101).   calcium gluconate 500 MG tablet Take 1 tablet by mouth daily.   cephALEXin 500 MG capsule Commonly known as:  KEFLEX Take 1 capsule (500 mg total) by mouth 4 (four) times daily.   cholecalciferol 25 MCG (1000 UT) tablet Commonly known as:  VITAMIN D3 Take  1,000 Units by mouth daily.   ENSURE Take 237 mLs by mouth 2 (two) times daily between meals.   Ferrous Fumarate 324 (106 Fe) MG Tabs tablet Commonly known as:  HEMOCYTE - 106 mg FE Take 1 tablet (106 mg of iron total) by mouth daily.   levothyroxine 100 MCG tablet Commonly known as:  SYNTHROID, LEVOTHROID Take 100 mcg by mouth daily before breakfast.   loperamide 2 MG tablet Commonly known as:  IMODIUM A-D Take 2 mg by mouth every 6 (six) hours as needed for diarrhea or loose stools.   polyethylene glycol  packet Commonly known as:  MIRALAX / GLYCOLAX Take 17 g by mouth daily as needed for mild constipation.   Probiotic 250 MG Caps Take 1 capsule by mouth daily.      Follow-up Information    pcp. Schedule an appointment as soon as possible for a visit in 1 week(s).          No Known Allergies  Consultations:  GI   Procedures/Studies: Ct Abdomen Pelvis Wo Contrast  Result Date: 08/08/2018 CLINICAL DATA:  Diarrhea.  History of colitis EXAM: CT ABDOMEN AND PELVIS WITHOUT CONTRAST TECHNIQUE: Multidetector CT imaging of the abdomen and pelvis was performed following the standard protocol without IV contrast. COMPARISON:  07/21/2016 FINDINGS: Lower chest: Large hiatal hernia containing most of the stomach, which is partially visualized. Distal transverse colon also attempts to herniate through the diaphragm at this level. Reticulation in the lower lungs which is a combination of both chronic opacity and atelectasis based on prior. Hepatobiliary: No focal liver abnormality.Full gallbladder with no evident inflammation or stone. Pancreas: Generalized atrophy. Spleen: Unremarkable. Adrenals/Urinary Tract: Negative adrenals. No hydronephrosis or stone. Renal atrophy with grade or cortical thinning on the left. Unremarkable bladder. Stomach/Bowel: Equivocal for circumferential wall thickening in the distal small bowel. No obstruction is seen. No colonic wall thickening noted. History of appendectomy. Vascular/Lymphatic: Atherosclerotic calcification. No mass or adenopathy. Reproductive:Hysterectomy Other: Small volume pelvic ascites, presumably reactive in this setting Musculoskeletal: Diffuse spinal degeneration with levoscoliosis. No acute or aggressive finding IMPRESSION: 1. Equivocal for distal enteritis.  No detectable colitis period 2. Small volume and likely reactive ascites. 3. Chronic findings are described above. Electronically Signed   By: Marnee Spring M.D.   On: 08/08/2018 11:44   Dg  Chest 2 View  Result Date: 08/08/2018 CLINICAL DATA:  Weakness and diarrhea EXAM: CHEST - 2 VIEW COMPARISON:  August 04, 2018 chest radiograph. Chest CT July 19, 2012 FINDINGS: There is atelectatic change in the left base. There is no frank edema or consolidation. Heart is upper normal in size with pulmonary vascularity normal. There is a sizable hiatal hernia. Patient is status post coronary artery bypass grafting. There is aortic atherosclerosis. Bones are diffusely osteoporotic. IMPRESSION: Left base atelectasis. No frank consolidation. Heart upper normal in size with postoperative changes and aortic atherosclerosis. Bones osteoporotic. Aortic Atherosclerosis (ICD10-I70.0). Electronically Signed   By: Bretta Bang III M.D.   On: 08/08/2018 11:37   Dg Chest 2 View  Result Date: 08/04/2018 CLINICAL DATA:  85 year old female status post fall with lower leg laceration. EXAM: CHEST - 2 VIEW COMPARISON:  Chest radiographs 11/02/2014 and earlier. FINDINGS: Upright AP and lateral views. Chronic hiatal hernia, CABG, Calcified aortic atherosclerosis. No acute osseous abnormality identified. Stable lung volumes. No pneumothorax, pleural effusion or consolidation. Increased pulmonary vascularity. Paucity of bowel gas in the upper abdomen. IMPRESSION: 1. Increased pulmonary vascularity, consider mild or developing interstitial edema. No pleural effusion.  2. Chronic hiatal hernia and Aortic Atherosclerosis (ICD10-I70.0). Electronically Signed   By: Odessa FlemingH  Hall M.D.   On: 08/04/2018 16:32   Dg Tibia/fibula Left  Result Date: 08/04/2018 CLINICAL DATA:  85 year old female status post fall with lower leg laceration. EXAM: LEFT TIBIA AND FIBULA - 2 VIEW COMPARISON:  None. FINDINGS: Dressing material in place along the lateral lower tib fib. No radiopaque foreign body identified. No soft tissue gas identified. No acute osseous abnormality identified. Degenerative changes at the left knee. Mild for age IMPRESSION: 1.  No acute osseous abnormality identified. 2. Soft tissue injury with no radiopaque foreign body. Electronically Signed   By: Odessa FlemingH  Hall M.D.   On: 08/04/2018 16:28   Ct Head Wo Contrast  Result Date: 08/04/2018 CLINICAL DATA:  Dizziness and fall EXAM: CT HEAD WITHOUT CONTRAST CT CERVICAL SPINE WITHOUT CONTRAST TECHNIQUE: Multidetector CT imaging of the head and cervical spine was performed following the standard protocol without intravenous contrast. Multiplanar CT image reconstructions of the cervical spine were also generated. COMPARISON:  11/02/2014 FINDINGS: CT HEAD FINDINGS Brain: There is no mass, hemorrhage or extra-axial collection. There is generalized atrophy without lobar predilection. There is hypoattenuation of the periventricular white matter, most commonly indicating chronic ischemic microangiopathy. Vascular: Atherosclerotic calcification of the vertebral and internal carotid arteries at the skull base. No abnormal hyperdensity of the major intracranial arteries or dural venous sinuses. Skull: The visualized skull base, calvarium and extracranial soft tissues are normal. Sinuses/Orbits: No fluid levels or advanced mucosal thickening of the visualized paranasal sinuses. No mastoid or middle ear effusion. The orbits are normal. CT CERVICAL SPINE FINDINGS Alignment: Grade 1 anterolisthesis at C4-5, unchanged. Facets are normally aligned. Skull base and vertebrae: No acute fracture. Soft tissues and spinal canal: No prevertebral fluid or swelling. No visible canal hematoma. Disc levels: Multilevel severe facet hypertrophy with foraminal narrowing greatest at right C5-6 and left C6-7. Upper chest: No pneumothorax, pulmonary nodule or pleural effusion. Other: Normal visualized paraspinal cervical soft tissues. IMPRESSION: 1. Atrophy and chronic ischemic microangiopathy without acute intracranial abnormality. 2. No acute fracture of the cervical spine. Electronically Signed   By: Deatra RobinsonKevin  Herman M.D.   On:  08/04/2018 17:53   Ct Cervical Spine Wo Contrast  Result Date: 08/04/2018 CLINICAL DATA:  Dizziness and fall EXAM: CT HEAD WITHOUT CONTRAST CT CERVICAL SPINE WITHOUT CONTRAST TECHNIQUE: Multidetector CT imaging of the head and cervical spine was performed following the standard protocol without intravenous contrast. Multiplanar CT image reconstructions of the cervical spine were also generated. COMPARISON:  11/02/2014 FINDINGS: CT HEAD FINDINGS Brain: There is no mass, hemorrhage or extra-axial collection. There is generalized atrophy without lobar predilection. There is hypoattenuation of the periventricular white matter, most commonly indicating chronic ischemic microangiopathy. Vascular: Atherosclerotic calcification of the vertebral and internal carotid arteries at the skull base. No abnormal hyperdensity of the major intracranial arteries or dural venous sinuses. Skull: The visualized skull base, calvarium and extracranial soft tissues are normal. Sinuses/Orbits: No fluid levels or advanced mucosal thickening of the visualized paranasal sinuses. No mastoid or middle ear effusion. The orbits are normal. CT CERVICAL SPINE FINDINGS Alignment: Grade 1 anterolisthesis at C4-5, unchanged. Facets are normally aligned. Skull base and vertebrae: No acute fracture. Soft tissues and spinal canal: No prevertebral fluid or swelling. No visible canal hematoma. Disc levels: Multilevel severe facet hypertrophy with foraminal narrowing greatest at right C5-6 and left C6-7. Upper chest: No pneumothorax, pulmonary nodule or pleural effusion. Other: Normal visualized paraspinal cervical soft tissues. IMPRESSION:  1. Atrophy and chronic ischemic microangiopathy without acute intracranial abnormality. 2. No acute fracture of the cervical spine. Electronically Signed   By: Deatra RobinsonKevin  Herman M.D.   On: 08/04/2018 17:53     Discharge Exam: Vitals:   08/12/18 0909 08/12/18 0912  BP: (!) 81/60 104/85  Pulse: 78 81  Resp: 16 16   Temp: 98 F (36.7 C) 98.3 F (36.8 C)  SpO2: 98% 100%   Vitals:   08/12/18 0644 08/12/18 0906 08/12/18 0909 08/12/18 0912  BP: 116/66 97/67 (!) 81/60 104/85  Pulse: 67 81 78 81  Resp: 15 17 16 16   Temp: 98.9 F (37.2 C) 97.6 F (36.4 C) 98 F (36.7 C) 98.3 F (36.8 C)  TempSrc:   Oral Oral  SpO2: 99% 100% 98% 100%  Weight:        General: Pt is alert, awake, not in acute distress, hard of hearing Cardiovascular: RRR, S1/S2 +, no rubs, no gallops Respiratory: CTA bilaterally, no wheezing, no rhonchi Abdominal: Soft, NT, ND, bowel sounds + Extremities: no edema, no cyanosis Wound to left lower extremity ankle seen by wound care with some tear, but no significant findings.    The results of significant diagnostics from this hospitalization (including imaging, microbiology, ancillary and laboratory) are listed below for reference.     Microbiology: Recent Results (from the past 240 hour(s))  Urine culture     Status: Abnormal   Collection Time: 08/08/18 11:07 AM  Result Value Ref Range Status   Specimen Description   Final    URINE, CATHETERIZED Performed at [facility], [address] [place], [place] [address]    Special Requests   Final    NONE Performed at [facility], [address] [place], [place] [address]    Culture >=100,000 COLONIES/mL ESCHERICHIA COLI (A)  Final   Report Status 08/11/2018 FINAL  Final   Organism ID, Bacteria ESCHERICHIA COLI (A)  Final      Susceptibility   Escherichia coli - MIC*    AMPICILLIN 4 SENSITIVE Sensitive     CEFAZOLIN <=4 SENSITIVE Sensitive     CEFTRIAXONE <=1 SENSITIVE Sensitive     CIPROFLOXACIN <=0.25 SENSITIVE Sensitive     GENTAMICIN <=1 SENSITIVE Sensitive     IMIPENEM <=0.25 SENSITIVE Sensitive     NITROFURANTOIN <=16 SENSITIVE Sensitive     TRIMETH/SULFA <=20 SENSITIVE Sensitive     AMPICILLIN/SULBACTAM <=2 SENSITIVE Sensitive     PIP/TAZO <=4 SENSITIVE Sensitive     Extended ESBL NEGATIVE Sensitive     *  >=100,000 COLONIES/mL ESCHERICHIA COLI     Labs: BNP (last 3 results) No results for input(s): BNP in the last 8760 hours. Basic Metabolic Panel: Recent Labs  Lab 08/08/18 1156 08/11/18 1316 08/12/18 0745  NA 135 136 137  K 4.1 4.0 4.1  CL 103 110 104  CO2 22 20* 25  GLUCOSE 91 94 90  BUN 42* 33* 29*  CREATININE 1.77* 1.42* 1.36*  CALCIUM 8.0* 7.7* 7.5*  MG  --   --  2.2   Liver Function Tests: Recent Labs  Lab 08/08/18 1156 08/11/18 1316  AST 34 31  ALT 30 27  ALKPHOS 101 83  BILITOT 1.7* 1.1  PROT 5.7* 4.9*  ALBUMIN 2.4* 2.1*   Recent Labs  Lab 08/08/18 1156  LIPASE 16   No results for input(s): AMMONIA in the last 168 hours. CBC: Recent Labs  Lab 08/08/18 1156 08/11/18 1316 08/12/18 0745  WBC 7.1 5.5 5.9  NEUTROABS 5.3  --   --  HGB 14.5 12.7 13.5  HCT 44.1 38.6 41.8  MCV 89.6 86.9 88.4  PLT 105* 99* 102*   Cardiac Enzymes: Recent Labs  Lab 08/08/18 1156  TROPONINI <0.03   BNP: Invalid input(s): POCBNP CBG: No results for input(s): GLUCAP in the last 168 hours. D-Dimer No results for input(s): DDIMER in the last 72 hours. Hgb A1c No results for input(s): HGBA1C in the last 72 hours. Lipid Profile No results for input(s): CHOL, HDL, LDLCALC, TRIG, CHOLHDL, LDLDIRECT in the last 72 hours. Thyroid function studies No results for input(s): TSH, T4TOTAL, T3FREE, THYROIDAB in the last 72 hours.  Invalid input(s): FREET3 Anemia work up No results for input(s): VITAMINB12, FOLATE, FERRITIN, TIBC, IRON, RETICCTPCT in the last 72 hours. Urinalysis    Component Value Date/Time   COLORURINE AMBER (A) 08/08/2018 1800   APPEARANCEUR CLOUDY (A) 08/08/2018 1800   LABSPEC 1.017 08/08/2018 1800   PHURINE 5.0 08/08/2018 1800   GLUCOSEU NEGATIVE 08/08/2018 1800   HGBUR LARGE (A) 08/08/2018 1800   BILIRUBINUR NEGATIVE 08/08/2018 1800   KETONESUR 5 (A) 08/08/2018 1800   PROTEINUR NEGATIVE 08/08/2018 1800   UROBILINOGEN 0.2 11/02/2014 1025    NITRITE POSITIVE (A) 08/08/2018 1800   LEUKOCYTESUR MODERATE (A) 08/08/2018 1800   Sepsis Labs Invalid input(s): PROCALCITONIN,  WBC,  LACTICIDVEN Microbiology Recent Results (from the past 240 hour(s))  Urine culture     Status: Abnormal   Collection Time: 08/08/18 11:07 AM  Result Value Ref Range Status   Specimen Description   Final    URINE, CATHETERIZED Performed at [facility], [address], [place], [place] [address]    Special Requests   Final    NONE Performed at [facility], [address] [place], [place] [address]    Culture >=100,000 COLONIES/mL ESCHERICHIA COLI (A)  Final   Report Status 08/11/2018 FINAL  Final   Organism ID, Bacteria ESCHERICHIA COLI (A)  Final      Susceptibility   Escherichia coli - MIC*    AMPICILLIN 4 SENSITIVE Sensitive     CEFAZOLIN <=4 SENSITIVE Sensitive     CEFTRIAXONE <=1 SENSITIVE Sensitive     CIPROFLOXACIN <=0.25 SENSITIVE Sensitive     GENTAMICIN <=1 SENSITIVE Sensitive     IMIPENEM <=0.25 SENSITIVE Sensitive     NITROFURANTOIN <=16 SENSITIVE Sensitive     TRIMETH/SULFA <=20 SENSITIVE Sensitive     AMPICILLIN/SULBACTAM <=2 SENSITIVE Sensitive     PIP/TAZO <=4 SENSITIVE Sensitive     Extended ESBL NEGATIVE Sensitive     * >=100,000 COLONIES/mL ESCHERICHIA COLI     Time coordinating discharge: 35 minutes  SIGNED:   Erick Blinks, DO [facility] 08/12/2018, 10:58 AM Pager [phone]  If 7PM-7AM, please contact night-coverage [email or web address] Password [email or web address]

## 2018-08-12 NOTE — Care Management Note (Addendum)
Case Management Note  Patient Details  Name: Elizabeth [name] Range MRN: [id number] Date of Birth: 07/25/1918  Subjective/Objective:         Admitted with AKI, UTI and diarrhea. [facility]. Pt returning to ALF at DC. [facility]. [facility] and pt family is in agreement. Discussed with facility and family by CSW. CMS provider list given. Aware HH has 48 hrs to make first visit. CSW to make arrangements for return to facility.            Action/Plan: DC today. Gypsy BalsamSarah, [facility], given referral.   Expected Discharge Date:  08/12/18               Expected Discharge Plan:  Assisted Living / Rest Home  In-House Referral:  Clinical Social Work  Discharge planning Services  CM Consult  Post Acute Care Choice:  Home Health Choice offered to:  Patient  HH Arranged:  PT HH Agency[facility]  [facility]  Status of Service:  Completed, signed off  Malcolm MetroChildress, [place] Comes Demske, RN 08/12/2018, 11:01 AM

## 2018-08-12 NOTE — Progress Notes (Signed)
Pt lost IV access at some point over the night. Per report, multiple RN's attempted and IV with no [place]. MD made aware. Stated to wait and see how morning labs and orthostatic vitals look before proceeding with another type of access. Will continue to monitor.

## 2018-08-12 NOTE — Progress Notes (Signed)
    Subjective: No diarrhea since admission. No abdominal pain but feels "gassy". States she feels the same as she usually does. No family at bedside.   Objective: Vital signs in last 24 hours: Temp:  [97 F (36.1 C)-98.9 F (37.2 C)] 98.3 F (36.8 C) (01/21 0912) Pulse Rate:  [67-81] 81 (01/21 0912) Resp:  [15-20] 16 (01/21 0912) BP: (81-132)/(60-85) 104/85 (01/21 0912) SpO2:  [94 %-100 %] 100 % (01/21 0912) Weight:  [54.3 kg] 54.3 kg (01/20 1200) Last BM Date: 08/11/18 General:   Alert and oriented, hard of hearing, pleasant Abdomen:  Bowel sounds present, round but soft, no TTP Neurologic:  Alert and  oriented to person, place   Intake/Output from previous day: 01/20 0701 - 01/21 0700 In: 543.5 [P.O.:240; I.V.:303.5] Out: -  Intake/Output this shift: Total I/O In: 120 [P.O.:120] Out: -   Lab Results: Recent Labs    08/11/18 1316 08/12/18 0745  WBC 5.5 5.9  HGB 12.7 13.5  HCT 38.6 41.8  PLT 99* 102*   BMET Recent Labs    08/11/18 1316 08/12/18 0745  NA 136 137  K 4.0 4.1  CL 110 104  CO2 20* 25  GLUCOSE 94 90  BUN 33* 29*  CREATININE 1.42* 1.36*  CALCIUM 7.7* 7.5*   LFT Recent Labs    08/11/18 1316  PROT 4.9*  ALBUMIN 2.1*  AST 31  ALT 27  ALKPHOS 83  BILITOT 1.1    Assessment/Plan: 85 year old female admitted yesterday, 08/11/2018, after outpatient visit to GI due to intermittent diarrhea, failure to thrive, dehydration. Recently found to have UTI several days ago and prescribed Keflex as outpatient. Clinically improved from admission yesterday.   Acute on chronic renal disease: in setting of dehydration, decreased oral intake, diarrhea. Improved renal function today.   UTI: continue treatments with Keflex for full course.   Chronic, intermittent diarrhea: none since admission. Doubt dealing with infectious process and likely multifactorial in setting of antibiotics, acute illness. If persistent diarrhea as outpatient when returns to  facility, need to check Cdiff due to recent exposure to antibiotics. Discussed with Dr. Sherryll Burger: patient to return back to facility today. Will arrange close interval follow-up with our office. Agree with palliative consultation.   Gelene Mink, PhD, ANP-BC [facility]      LOS: 1 day    08/12/2018, 9:49 AM

## 2018-08-12 NOTE — Progress Notes (Signed)
[facility] FAX # [phone]

## 2018-08-13 ENCOUNTER — Encounter: Payer: Self-pay | Admitting: Internal Medicine

## 2018-08-13 NOTE — Telephone Encounter (Signed)
SCHEDULED AND LETTER SENT  °

## 2018-08-19 ENCOUNTER — Non-Acute Institutional Stay: Payer: Medicare Other | Admitting: Internal Medicine

## 2018-08-19 ENCOUNTER — Encounter: Payer: Self-pay | Admitting: Internal Medicine

## 2018-08-19 VITALS — BP 82/60

## 2018-08-19 DIAGNOSIS — Z515 Encounter for palliative care: Secondary | ICD-10-CM

## 2018-08-19 NOTE — Progress Notes (Signed)
Jan 28th, 2020 [facility] Palliative Care Telephone: [phone] Fax: [phone]  PATIENT NAME: Elizabeth [name] DOB: [birth year] MRN: [id number] Leonie Green RM 206 B  PRIMARY CARE and REFERRING PROVIDER:  [facility] ANP (08/15/18) [facility]. [phone] Garald Braver (nsg staff)  RESPONSIBLE PARTY:  Liz [facility], [phone]. visits qo weekend.  ASSESSMENT/RECOMMENDATIONS:  1.Progressive functional decline and weight loss: PPS: 30% (from 40% 1 month ago). Over the past month, patient has had increased weakness, and is now dependent for dressing, hygiene, toileting, and transfers. She is no longer ambulatory, and is now chair bound. She has had increased falls 2/2 weakness, 4 over the last month. Staff report she is increasingly confused with episodic agitation. She has decreased oral intake, now just bites of her meals. Her weight Jan 3rd was 115.2lbs, a loss of 11.1lbs (8.8% of her body weight) over the prior month. At a height of 5'3", her BMI is 20.4 kg/m2. She has an abrasive wound to her left lateral shin from her last fall. Patient is hypotensive at 82-86 systolic.  2. Goals of Care: Discussed with patient's PCG nephew Liz Beach. He wishes to focus on comfort care and avoid hospitalizations. We discussed hospice care (eligibility criteria, services provided, medications covered and potentially not covered under the formulary). John concurs with recommendation for hospice services. Referral obtained from patient's PCP facility NP [facility]. Discussed with facility RN Garald Braver who will fax in referral.  3. Advanced Care Directives: DNR on chart. Confirmed these directives with nephew Jonny Ruiz.   I spent 120 minutes providing this consultation, from 10:30am to 12:30pm. More than 50% of the time in this consultation was spent coordinating communication, interviewing staff and family members, and reconciling NH MAR with EPIC  EMR.   HISTORY OF PRESENT ILLNESS:  Elizabeth [name] is a 85 y.o. year old female with medical h/o Alzheimer's disease,HTN, dyslipidemia, hypothyroidism, osteoporosis, CAD, acute on CKK (creatinine 1.36 on 08/12/2018) and chronic diarrhea. Palliative Care was asked to help address goals of care.   CODE STATUS: DNR on chart, per nsg staf  PPS: weak 30% HOSPICE ELIGIBILITY/DIAGNOSIS: yes/protein calorie malnutrition.  PAST MEDICAL HISTORY:  Past Medical History:  Diagnosis Date  . Anemia   . Arthritis   . B12 deficiency   . C. difficile colitis Nov 2014   Treated with Flagyl  . Coronary artery disease   . GERD (gastroesophageal reflux disease)   . Hypercholesterolemia   . Hyperlipidemia   . Hypertension   . Hypothyroidism   . Osteoporosis   . Thyroid goiter    s/p surgery now on Synthroid  . Zenker diverticulum     SOCIAL HX:  Social History   Tobacco Use  . Smoking status: Never Smoker  . Smokeless tobacco: Never Used  Substance Use Topics  . Alcohol use: No    ALLERGIES: No Known Allergies   PERTINENT MEDICATIONS:  Outpatient Encounter Medications as of 08/19/2018  Medication Sig  . acetaminophen (TYLENOL) 325 MG tablet Take 2 tablets (650 mg total) by mouth every 6 (six) hours as needed for mild pain (or Fever >/= 101).  . calcium gluconate 500 MG tablet Take 1 tablet by mouth daily.  . cephALEXin (KEFLEX) 500 MG capsule Take 1 capsule (500 mg total) by mouth 4 (four) times daily.  . cholecalciferol (VITAMIN D3) 25 MCG (1000 UT) tablet Take 1,000 Units by mouth daily.  Marland Kitchen ENSURE (ENSURE) Take 237 mLs by mouth  2 (two) times daily between meals.  . Ferrous Fumarate (HEMOCYTE - 106 MG FE) 324 (106 Fe) MG TABS tablet Take 1 tablet (106 mg of iron total) by mouth daily.  Marland Kitchen levothyroxine (SYNTHROID, LEVOTHROID) 100 MCG tablet Take 100 mcg by mouth daily before breakfast.  . loperamide (IMODIUM A-D) 2 MG tablet Take 2 mg by mouth every 6 (six) hours as needed for diarrhea or  loose stools.  . polyethylene glycol (MIRALAX / GLYCOLAX) packet Take 17 g by mouth daily as needed for mild constipation.  . Saccharomyces boulardii (PROBIOTIC) 250 MG CAPS Take 1 capsule by mouth daily.   No facility-administered encounter medications on file as of 08/19/2018.     PHYSICAL EXAM:  VS: BP 82-86 systolic. Her pulse is thready.  General: Thin, fatigued, and frail appearing, thin. She is breathless with conversation Cardiovascular: regular rate and rhythm Pulmonary: clear ant/posterior lung fields Abdomen: soft, nontender, + bowel sounds GU: no suprapubic tenderness Extremities: no edema, no joint deformities Skin: no rashes Neurological: Weakness but otherwise nonfocal  Anselm Lis, NP

## 2018-09-16 ENCOUNTER — Ambulatory Visit: Payer: Medicare Other | Admitting: Gastroenterology

## 2018-09-21 DEATH — deceased
